# Patient Record
Sex: Female | Born: 1955
Health system: Southern US, Community
[De-identification: ages and names within clinical notes are randomized; demographics above are authoritative.]

## PROBLEM LIST (undated history)

## (undated) DIAGNOSIS — H5440 Blindness, one eye, unspecified eye: Secondary | ICD-10-CM

## (undated) DIAGNOSIS — K219 Gastro-esophageal reflux disease without esophagitis: Secondary | ICD-10-CM

## (undated) DIAGNOSIS — M1990 Unspecified osteoarthritis, unspecified site: Secondary | ICD-10-CM

## (undated) DIAGNOSIS — H5450 Low vision, one eye, unspecified eye: Secondary | ICD-10-CM

## (undated) DIAGNOSIS — E119 Type 2 diabetes mellitus without complications: Secondary | ICD-10-CM

## (undated) DIAGNOSIS — E785 Hyperlipidemia, unspecified: Secondary | ICD-10-CM

## (undated) DIAGNOSIS — H5410 Blindness, one eye, low vision other eye, unspecified eyes: Secondary | ICD-10-CM

## (undated) HISTORY — PX: CARPAL TUNNEL RELEASE: SHX101

## (undated) HISTORY — DX: Low vision, one eye, unspecified eye: H54.50

## (undated) HISTORY — DX: Unspecified osteoarthritis, unspecified site: M19.90

## (undated) HISTORY — PX: CATARACT PEDIATRIC: SHX6289

## (undated) HISTORY — DX: Type 2 diabetes mellitus without complications: E11.9

## (undated) HISTORY — DX: Gastro-esophageal reflux disease without esophagitis: K21.9

## (undated) HISTORY — DX: Blindness, one eye, low vision other eye, unspecified eyes: H54.10

## (undated) HISTORY — DX: Hyperlipidemia, unspecified: E78.5

## (undated) HISTORY — DX: Blindness, one eye, unspecified eye: H54.40

---

## 2000-09-23 ENCOUNTER — Other Ambulatory Visit: Admission: RE | Admit: 2000-09-23 | Discharge: 2000-09-23 | Payer: Self-pay | Admitting: Family Medicine

## 2011-05-06 ENCOUNTER — Encounter: Payer: Self-pay | Admitting: Nurse Practitioner

## 2011-05-06 DIAGNOSIS — M8580 Other specified disorders of bone density and structure, unspecified site: Secondary | ICD-10-CM | POA: Insufficient documentation

## 2011-05-06 DIAGNOSIS — M6780 Other specified disorders of synovium and tendon, unspecified site: Secondary | ICD-10-CM | POA: Insufficient documentation

## 2011-05-06 DIAGNOSIS — E559 Vitamin D deficiency, unspecified: Secondary | ICD-10-CM | POA: Insufficient documentation

## 2011-05-06 DIAGNOSIS — E785 Hyperlipidemia, unspecified: Secondary | ICD-10-CM | POA: Insufficient documentation

## 2011-05-06 DIAGNOSIS — K219 Gastro-esophageal reflux disease without esophagitis: Secondary | ICD-10-CM | POA: Insufficient documentation

## 2011-05-06 DIAGNOSIS — Z970 Presence of artificial eye: Secondary | ICD-10-CM | POA: Insufficient documentation

## 2011-05-06 DIAGNOSIS — H5440 Blindness, one eye, unspecified eye: Secondary | ICD-10-CM

## 2012-02-09 ENCOUNTER — Ambulatory Visit: Payer: BC Managed Care – PPO | Attending: Family Medicine | Admitting: Physical Therapy

## 2012-02-09 DIAGNOSIS — IMO0001 Reserved for inherently not codable concepts without codable children: Secondary | ICD-10-CM | POA: Insufficient documentation

## 2012-02-09 DIAGNOSIS — R5381 Other malaise: Secondary | ICD-10-CM | POA: Insufficient documentation

## 2012-02-09 DIAGNOSIS — M545 Low back pain, unspecified: Secondary | ICD-10-CM | POA: Insufficient documentation

## 2012-02-15 ENCOUNTER — Ambulatory Visit: Payer: BC Managed Care – PPO | Admitting: *Deleted

## 2012-02-18 ENCOUNTER — Ambulatory Visit: Payer: BC Managed Care – PPO | Admitting: Physical Therapy

## 2013-04-04 ENCOUNTER — Encounter: Payer: Self-pay | Admitting: Nurse Practitioner

## 2013-04-04 ENCOUNTER — Ambulatory Visit (INDEPENDENT_AMBULATORY_CARE_PROVIDER_SITE_OTHER): Payer: BC Managed Care – PPO | Admitting: Nurse Practitioner

## 2013-04-04 VITALS — BP 113/63 | HR 57 | Temp 97.5°F | Ht 62.0 in | Wt 170.0 lb

## 2013-04-04 DIAGNOSIS — R32 Unspecified urinary incontinence: Secondary | ICD-10-CM

## 2013-04-04 DIAGNOSIS — E119 Type 2 diabetes mellitus without complications: Secondary | ICD-10-CM

## 2013-04-04 DIAGNOSIS — E785 Hyperlipidemia, unspecified: Secondary | ICD-10-CM

## 2013-04-04 LAB — BASIC METABOLIC PANEL WITH GFR
BUN: 17 mg/dL (ref 6–23)
Chloride: 104 mEq/L (ref 96–112)
GFR, Est African American: >89 mL/min
Glucose, Bld: 97 mg/dL (ref 70–99)
Potassium: 4.6 mEq/L (ref 3.5–5.3)
Sodium: 139 mEq/L (ref 135–145)

## 2013-04-04 LAB — HEPATIC FUNCTION PANEL
ALT: 25 U/L (ref 0–35)
Alkaline Phosphatase: 38 U/L — ABNORMAL LOW (ref 39–117)
Bilirubin, Direct: 0.1 mg/dL (ref 0.0–0.3)
Indirect Bilirubin: 0.4 mg/dL (ref 0.0–0.9)
Total Protein: 7.4 g/dL (ref 6.0–8.3)

## 2013-04-04 NOTE — Progress Notes (Signed)
Subjective:    Patient ID: Katherine Ryan, female    DOB: 24-Jul-1956, 57 y.o.   MRN: [id number]  Hyperlipidemia This is a chronic problem. The current episode started more than 1 year ago. The problem is controlled. Recent lipid tests were reviewed and are normal. Exacerbating diseases include diabetes. There are no known factors aggravating her hyperlipidemia. Pertinent negatives include no focal weakness, leg pain or myalgias. Current antihyperlipidemic treatment includes statins. The current treatment provides significant improvement of lipids. There are no compliance problems.  Risk factors for coronary artery disease include diabetes mellitus.  Diabetes She presents for her follow-up diabetic visit. She has type 2 diabetes mellitus. No MedicAlert identification noted. The initial diagnosis of diabetes was made 4 months ago. Her disease course has been improving. There are no hypoglycemic associated symptoms. Pertinent negatives for diabetes include no blurred vision, no fatigue, no foot paresthesias, no polydipsia, no polyphagia, no polyuria, no weakness and no weight loss. There are no hypoglycemic complications. Symptoms are stable. Pertinent negatives for diabetic complications include no heart disease. Risk factors for coronary artery disease include dyslipidemia. Current diabetic treatment includes diet and oral agent (monotherapy). She is compliant with treatment all of the time. Her weight is stable. She is following a diabetic and generally healthy diet. When asked about meal planning, she reported none. She has not had a previous visit with a dietician. She participates in exercise intermittently. Home blood sugar record trend: Patient has never check blood sugars at home. An ACE inhibitor/angiotensin II receptor blocker is not being taken. She does not see a podiatrist.Eye exam current: January 2014.   * Patient c/o urinary incontinence- Has been going on for several months. Has to wear a pad  everyday.   Review of Systems  Constitutional: Negative for weight loss and fatigue.  HENT: Negative.   Eyes: Negative.  Negative for blurred vision.  Respiratory: Negative.   Cardiovascular: Negative.   Gastrointestinal: Negative.   Endocrine: Negative for polydipsia, polyphagia and polyuria.  Genitourinary: Negative.   Musculoskeletal: Negative.  Negative for myalgias.  Neurological: Negative.  Negative for focal weakness and weakness.  Psychiatric/Behavioral: Negative.        Objective:   Physical Exam  Constitutional: She is oriented to person, place, and time. She appears well-developed and well-nourished.  HENT:  Nose: Nose normal.  Mouth/Throat: Oropharynx is clear and moist.  Eyes: EOM are normal.  Neck: Trachea normal, normal range of motion and full passive range of motion without pain. Neck supple. No JVD present. Carotid bruit is not present. No thyromegaly present.  Cardiovascular: Normal rate, regular rhythm, normal heart sounds and intact distal pulses.  Exam reveals no gallop and no friction rub.   No murmur heard. Pulmonary/Chest: Effort normal and breath sounds normal.  Abdominal: Soft. Bowel sounds are normal. She exhibits no distension and no mass. There is no tenderness.  Musculoskeletal: Normal range of motion.  Lymphadenopathy:    She has no cervical adenopathy.  Neurological: She is alert and oriented to person, place, and time. She has normal reflexes.  (+) 4/4 monofilament bil  Skin: Skin is warm and dry.  No callus on feet  Psychiatric: She has a normal mood and affect. Her behavior is normal. Judgment and thought content normal.   BP 113/63  Pulse 57  Temp(Src) 97.5 F (36.4 C) (Oral)  Ht 5\' 2"  (1.575 m)  Wt 170 lb (77.111 kg)  BMI 31.09 kg/m2  LMP 04/04/2005 Results for orders placed in visit on 04/04/13  POCT GLYCOSYLATED HEMOGLOBIN (HGB A1C)      Result Value Range   Hemoglobin A1C 6.1            Assessment & Plan:  1.  Diabetes Continue to count carbs Exercise encouraged - HgB A1c - BASIC METABOLIC PANEL WITH GFR - Hepatic function panel - NMR Lipoprofile with Lipids  2. Other and unspecified hyperlipidemia Low fat diet  3. Urinary Incontinence Referral to urologist Kegel exercises encouraged  Mary-Margaret Daphine Deutscher, FNP

## 2013-04-04 NOTE — Patient Instructions (Addendum)
Diets for Diabetes, Food Labeling Look at food labels to help you decide how much of a product you can eat. You will want to check the amount of total carbohydrate in a serving to see how the food fits into your meal plan. In the list of ingredients, the ingredient present in the largest amount by weight must be listed first, followed by the other ingredients in descending order. STANDARD OF IDENTITY Most products have a list of ingredients. However, foods that the Food and Drug Administration (FDA) has given a standard of identity do not need a list of ingredients. A standard of identity means that a food must contain certain ingredients if it is called a particular name. Examples are mayonnaise, peanut butter, ketchup, jelly, and cheese. LABELING TERMS There are many terms found on food labels. Some of these terms have specific definitions. Some terms are regulated by the FDA, and the FDA has clearly specified how they can be used. Others are not regulated or well-defined and can be misleading and confusing. SPECIFICALLY DEFINED TERMS Nutritive Sweetener.  A sweetener that contains calories,such as table sugar or honey. Nonnutritive Sweetener.  A sweetener with few or no calories,such as saccharin, aspartame, sucralose, and cyclamate. LABELING TERMS REGULATED BY THE FDA Free.  The product contains only a tiny or small amount of fat, cholesterol, sodium, sugar, or calories. For example, a "fat-free" product will contain less than 0.5 g of fat per serving. Low.  A food described as "low" in fat, saturated fat, cholesterol, sodium, or calories could be eaten fairly often without exceeding dietary guidelines. For example, "low in fat" means no more than 3 g of fat per serving. Lean.  "Lean" and "extra lean" are U.S. Department of Agriculture (USDA) terms for use on meat and poultry products. "Lean" means the product contains less than 10 g of fat, 4 g of saturated fat, and 95 mg of cholesterol  per serving. "Lean" is not as low in fat as a product labeled "low." Extra Lean.  "Extra lean" means the product contains less than 5 g of fat, 2 g of saturated fat, and 95 mg of cholesterol per serving. While "extra lean" has less fat than "lean," it is still higher in fat than a product labeled "low." Reduced, Less, Fewer.  A diet product that contains 25% less of a nutrient or calories than the regular version. For example, hot dogs might be labeled "25% less fat than our regular hot dogs." Light/Lite.  A diet product that contains  fewer calories or  the fat of the original. For example, "light in sodium" means a product with  the usual sodium. More.  One serving contains at least 10% more of the daily value of a vitamin, mineral, or fiber than usual. Good Source Of.  One serving contains 10% to 19% of the daily value for a particular vitamin, mineral, or fiber. Excellent Source Of.  One serving contains 20% or more of the daily value for a particular nutrient. Other terms used might be "high in" or "rich in." Enriched or Fortified.  The product contains added vitamins, minerals, or protein. Nutrition labeling must be used on enriched or fortified foods. Imitation.  The product has been altered so that it is lower in protein, vitamins, or minerals than the usual food,such as imitation peanut butter. Total Fat.  The number listed is the total of all fat found in a serving of the product. Under total fat, food labels must list saturated fat and   trans fat, which are associated with raising bad cholesterol and an increased risk of heart blood vessel disease. Saturated Fat.  Mainly fats from animal-based sources. Some examples are red meat, cheese, cream, whole milk, and coconut oil. Trans Fat.  Found in some fried snack foods, packaged foods, and fried restaurant foods. It is recommended you eat as close to 0 g of trans fat as possible, since it raises bad cholesterol and lowers  good cholesterol. Polyunsaturated and Monounsaturated Fats.  More healthful fats. These fats are from plant sources. Total Carbohydrate.  The number of carbohydrate grams in a serving of the product. Under total carbohydrate are listed the other carbohydrate sources, such as dietary fiber and sugars. Dietary Fiber.  A carbohydrate from plant sources. Sugars.  Sugars listed on the label contain all naturally occurring sugars as well as added sugars. LABELING TERMS NOT REGULATED BY THE FDA Sugarless.  Table sugar (sucrose) has not been added. However, the manufacturer may use another form of sugar in place of sucrose to sweeten the product. For example, sugar alcohols are used to sweeten foods. Sugar alcohols are a form of sugar but are not table sugar. If a product contains sugar alcohols in place of sucrose, it can still be labeled "sugarless." Low Salt, Salt-Free, Unsalted, No Salt, No Salt Added, Without Added Salt.  Food that is usually processed with salt has been made without salt. However, the food may contain sodium-containing additives, such as preservatives, leavening agents, or flavorings. Natural.  This term has no legal meaning. Organic.  Foods that are certified as organic have been inspected and approved by the USDA to ensure they are produced without pesticides, fertilizers containing synthetic ingredients, bioengineering, or ionizing radiation. Document Released: 12/16/2003 Document Revised: 03/06/2012 Document Reviewed: 07/03/2009 [employer] Patient Information 2013 [employer].  

## 2013-04-05 LAB — NMR LIPOPROFILE WITH LIPIDS
Cholesterol, Total: 151 mg/dL (ref <200)
HDL Particle Number: 31.2 umol/L (ref >=30.5)
HDL-C: 51 mg/dL (ref >=40)
LDL (calc): 89 mg/dL (ref <100)
LDL Size: 20.2 nm — ABNORMAL LOW (ref >20.5)
LP-IR Score: <25 (ref <=45)
Large HDL-P: 8.7 umol/L (ref >=4.8)

## 2013-05-11 ENCOUNTER — Ambulatory Visit (INDEPENDENT_AMBULATORY_CARE_PROVIDER_SITE_OTHER): Payer: BC Managed Care – PPO | Admitting: Urology

## 2013-05-11 DIAGNOSIS — N3946 Mixed incontinence: Secondary | ICD-10-CM

## 2013-07-02 ENCOUNTER — Telehealth: Payer: Self-pay | Admitting: Family Medicine

## 2013-07-06 ENCOUNTER — Ambulatory Visit: Payer: BC Managed Care – PPO | Admitting: Nurse Practitioner

## 2013-07-11 ENCOUNTER — Encounter: Payer: Self-pay | Admitting: *Deleted

## 2013-07-20 ENCOUNTER — Ambulatory Visit (INDEPENDENT_AMBULATORY_CARE_PROVIDER_SITE_OTHER): Payer: BC Managed Care – PPO | Admitting: Urology

## 2013-07-20 DIAGNOSIS — N3946 Mixed incontinence: Secondary | ICD-10-CM

## 2013-07-27 ENCOUNTER — Other Ambulatory Visit: Payer: Self-pay | Admitting: *Deleted

## 2013-07-27 MED ORDER — METFORMIN HCL 500 MG PO TABS: 500.0000 mg | ORAL_TABLET | Freq: Every day | ORAL | Status: DC

## 2013-08-08 ENCOUNTER — Ambulatory Visit: Payer: BC Managed Care – PPO | Admitting: Nurse Practitioner

## 2013-08-09 ENCOUNTER — Ambulatory Visit (INDEPENDENT_AMBULATORY_CARE_PROVIDER_SITE_OTHER): Payer: BC Managed Care – PPO | Admitting: Nurse Practitioner

## 2013-08-09 ENCOUNTER — Encounter: Payer: Self-pay | Admitting: Nurse Practitioner

## 2013-08-09 VITALS — BP 127/73 | HR 65 | Temp 98.3°F | Ht 62.0 in | Wt 166.0 lb

## 2013-08-09 DIAGNOSIS — M949 Disorder of cartilage, unspecified: Secondary | ICD-10-CM

## 2013-08-09 DIAGNOSIS — M899 Disorder of bone, unspecified: Secondary | ICD-10-CM

## 2013-08-09 DIAGNOSIS — K219 Gastro-esophageal reflux disease without esophagitis: Secondary | ICD-10-CM

## 2013-08-09 DIAGNOSIS — M8580 Other specified disorders of bone density and structure, unspecified site: Secondary | ICD-10-CM

## 2013-08-09 DIAGNOSIS — E119 Type 2 diabetes mellitus without complications: Secondary | ICD-10-CM | POA: Insufficient documentation

## 2013-08-09 DIAGNOSIS — E785 Hyperlipidemia, unspecified: Secondary | ICD-10-CM

## 2013-08-09 LAB — POCT GLYCOSYLATED HEMOGLOBIN (HGB A1C): Hemoglobin A1C: 6.4

## 2013-08-09 MED ORDER — ROSUVASTATIN CALCIUM 10 MG PO TABS: 10.0000 mg | ORAL_TABLET | Freq: Every day | ORAL | Status: DC

## 2013-08-09 MED ORDER — ESOMEPRAZOLE MAGNESIUM 40 MG PO CPDR: 40.0000 mg | DELAYED_RELEASE_CAPSULE | Freq: Every day | ORAL | Status: DC | PRN

## 2013-08-09 MED ORDER — METFORMIN HCL 500 MG PO TABS: 500.0000 mg | ORAL_TABLET | Freq: Every day | ORAL | Status: DC

## 2013-08-09 NOTE — Progress Notes (Signed)
Subjective:    Patient ID: Katherine Ryan, female    DOB: 03/11/1956, 57 y.o.   MRN: [id number]  Hyperlipidemia This is a chronic problem. The current episode started more than 1 year ago. The problem is controlled. Recent lipid tests were reviewed and are normal. Exacerbating diseases include diabetes. There are no known factors aggravating her hyperlipidemia. Pertinent negatives include no focal weakness, leg pain or myalgias. Current antihyperlipidemic treatment includes statins. The current treatment provides significant improvement of lipids. There are no compliance problems.  Risk factors for coronary artery disease include diabetes mellitus.  Diabetes She presents for her follow-up diabetic visit. She has type 2 diabetes mellitus. No MedicAlert identification noted. The initial diagnosis of diabetes was made 4 months ago. Her disease course has been improving. There are no hypoglycemic associated symptoms. Pertinent negatives for diabetes include no blurred vision, no fatigue, no foot paresthesias, no polydipsia, no polyphagia, no polyuria, no weakness and no weight loss. There are no hypoglycemic complications. Symptoms are stable. Pertinent negatives for diabetic complications include no heart disease. Risk factors for coronary artery disease include dyslipidemia. Current diabetic treatment includes diet and oral agent (monotherapy). She is compliant with treatment all of the time. Her weight is stable. She is following a diabetic and generally healthy diet. When asked about meal planning, she reported none. She has not had a previous visit with a dietician. She participates in exercise intermittently. Home blood sugar record trend: Patient has never check blood sugars at home. An ACE inhibitor/angiotensin II receptor blocker is not being taken. She does not see a podiatrist.Eye exam current: January 2014.  GERD Nexium works well- no symptoms when takes meds  Review of Systems  Constitutional:  Negative for weight loss and fatigue.  HENT: Negative.   Eyes: Negative.  Negative for blurred vision.  Respiratory: Negative.   Cardiovascular: Negative.   Gastrointestinal: Negative.   Endocrine: Negative for polydipsia, polyphagia and polyuria.  Genitourinary: Negative.   Musculoskeletal: Negative.  Negative for myalgias.  Neurological: Negative.  Negative for focal weakness and weakness.  Psychiatric/Behavioral: Negative.        Objective:   Physical Exam  Constitutional: She is oriented to person, place, and time. She appears well-developed and well-nourished.  HENT:  Nose: Nose normal.  Mouth/Throat: Oropharynx is clear and moist.  Eyes: EOM are normal.  Neck: Trachea normal, normal range of motion and full passive range of motion without pain. Neck supple. No JVD present. Carotid bruit is not present. No thyromegaly present.  Cardiovascular: Normal rate, regular rhythm, normal heart sounds and intact distal pulses.  Exam reveals no gallop and no friction rub.   No murmur heard. Pulmonary/Chest: Effort normal and breath sounds normal.  Abdominal: Soft. Bowel sounds are normal. She exhibits no distension and no mass. There is no tenderness.  Musculoskeletal: Normal range of motion.  Lymphadenopathy:    She has no cervical adenopathy.  Neurological: She is alert and oriented to person, place, and time. She has normal reflexes.  (+) 4/4 monofilament bil  Skin: Skin is warm and dry.  No callus on feet  Psychiatric: She has a normal mood and affect. Her behavior is normal. Judgment and thought content normal.   BP 127/73  Pulse 65  Temp(Src) 98.3 F (36.8 C) (Oral)  Ht 5\' 2"  (1.575 m)  Wt 166 lb (75.297 kg)  BMI 30.35 kg/m2  LMP 04/04/2005 Results for orders placed in visit on 08/09/13  POCT GLYCOSYLATED HEMOGLOBIN (HGB A1C)  Result Value Range   Hemoglobin A1C 6.4%          Assessment & Plan:  1. Hyperlipemia Low fat diet an dexercise - CMP14+EGFR - NMR,  lipoprofile  2. Osteopenia Weight bearing exercises  3. GERD (gastroesophageal reflux disease) Watch spicy foods in diet  4. Diabetes Carb counting reviewed  Meds ordered this encounter  Medications  . mirabegron ER (MYRBETRIQ) 50 MG TB24 tablet    Sig: Take 50 mg by mouth daily.  . rosuvastatin (CRESTOR) 10 MG tablet    Sig: Take 1 tablet (10 mg total) by mouth daily.    Dispense:  30 tablet    Refill:  5    Order Specific Question:  Supervising Provider    Answer:  Ernestina Penna [[id number]]  . esomeprazole (NEXIUM) 40 MG capsule    Sig: Take 1 capsule (40 mg total) by mouth daily as needed.    Dispense:  30 capsule    Refill:  5    Order Specific Question:  Supervising Provider    Answer:  Ernestina Penna [[id number]]  . metFORMIN (GLUCOPHAGE) 500 MG tablet    Sig: Take 1 tablet (500 mg total) by mouth daily with breakfast.    Dispense:  30 tablet    Refill:  0    Needs to be seen before next refill    Order Specific Question:  Supervising Provider    Answer:  Ernestina Penna [[id number]]     Katherine Daphine Deutscher, FNP

## 2013-08-09 NOTE — Patient Instructions (Addendum)

## 2013-08-11 LAB — NMR, LIPOPROFILE
HDL Cholesterol by NMR: 54 mg/dL (ref >=40)
LDLC SERPL CALC-MCNC: 71 mg/dL (ref <100)
Small LDL Particle Number: 150 nmol/L (ref <=527)
Triglycerides by NMR: 197 mg/dL — ABNORMAL HIGH (ref <150)

## 2013-08-11 LAB — CMP14+EGFR
ALT: 30 IU/L (ref 0–32)
AST: 29 IU/L (ref 0–40)
Albumin/Globulin Ratio: 1.8 (ref 1.1–2.5)
CO2: 29 mmol/L (ref 18–29)
Calcium: 9.7 mg/dL (ref 8.7–10.2)
Creatinine, Ser: 0.58 mg/dL (ref 0.57–1.00)
GFR calc non Af Amer: 103 mL/min/{1.73_m2} (ref >59)
Globulin, Total: 2.6 g/dL (ref 1.5–4.5)
Glucose: 99 mg/dL (ref 65–99)
Potassium: 4.7 mmol/L (ref 3.5–5.2)
Total Protein: 7.3 g/dL (ref 6.0–8.5)

## 2013-08-14 ENCOUNTER — Telehealth: Payer: Self-pay | Admitting: Nurse Practitioner

## 2013-08-14 NOTE — Telephone Encounter (Signed)
Left details on pt vm. 

## 2013-09-14 ENCOUNTER — Ambulatory Visit (INDEPENDENT_AMBULATORY_CARE_PROVIDER_SITE_OTHER): Payer: BC Managed Care – PPO | Admitting: Urology

## 2013-09-14 DIAGNOSIS — N3946 Mixed incontinence: Secondary | ICD-10-CM

## 2013-10-01 ENCOUNTER — Other Ambulatory Visit: Payer: Self-pay

## 2013-10-01 DIAGNOSIS — E119 Type 2 diabetes mellitus without complications: Secondary | ICD-10-CM

## 2013-10-01 MED ORDER — METFORMIN HCL 500 MG PO TABS: 500.0000 mg | ORAL_TABLET | Freq: Every day | ORAL | Status: DC

## 2013-11-01 ENCOUNTER — Other Ambulatory Visit: Payer: Self-pay

## 2013-11-14 ENCOUNTER — Ambulatory Visit (INDEPENDENT_AMBULATORY_CARE_PROVIDER_SITE_OTHER): Payer: BC Managed Care – PPO | Admitting: Nurse Practitioner

## 2013-11-14 ENCOUNTER — Encounter: Payer: Self-pay | Admitting: Nurse Practitioner

## 2013-11-14 VITALS — BP 133/74 | HR 56 | Temp 98.3°F | Ht 62.0 in | Wt 168.0 lb

## 2013-11-14 DIAGNOSIS — K219 Gastro-esophageal reflux disease without esophagitis: Secondary | ICD-10-CM

## 2013-11-14 DIAGNOSIS — E785 Hyperlipidemia, unspecified: Secondary | ICD-10-CM

## 2013-11-14 DIAGNOSIS — M899 Disorder of bone, unspecified: Secondary | ICD-10-CM

## 2013-11-14 DIAGNOSIS — M8580 Other specified disorders of bone density and structure, unspecified site: Secondary | ICD-10-CM

## 2013-11-14 DIAGNOSIS — E119 Type 2 diabetes mellitus without complications: Secondary | ICD-10-CM

## 2013-11-14 LAB — POCT GLYCOSYLATED HEMOGLOBIN (HGB A1C): Hemoglobin A1C: 5.5

## 2013-11-14 NOTE — Patient Instructions (Signed)

## 2013-11-14 NOTE — Progress Notes (Signed)
Subjective:    Patient ID: Katherine Ryan, female    DOB: [birth year], 57 y.o.   MRN: [id number]  Hyperlipidemia This is a chronic problem. The current episode started more than 1 year ago. The problem is controlled. Recent lipid tests were reviewed and are normal. Exacerbating diseases include diabetes. There are no known factors aggravating her hyperlipidemia. Pertinent negatives include no focal weakness, leg pain or myalgias. Current antihyperlipidemic treatment includes statins. The current treatment provides significant improvement of lipids. There are no compliance problems.  Risk factors for coronary artery disease include diabetes mellitus.  Diabetes She presents for her follow-up diabetic visit. She has type 2 diabetes mellitus. No MedicAlert identification noted. The initial diagnosis of diabetes was made 4 months ago. Her disease course has been improving. There are no hypoglycemic associated symptoms. Pertinent negatives for diabetes include no blurred vision, no fatigue, no foot paresthesias, no polydipsia, no polyphagia, no polyuria, no weakness and no weight loss. There are no hypoglycemic complications. Symptoms are stable. Pertinent negatives for diabetic complications include no heart disease. Risk factors for coronary artery disease include dyslipidemia. Current diabetic treatment includes diet and oral agent (monotherapy). She is compliant with treatment all of the time. Her weight is stable. She is following a diabetic and generally healthy diet. When asked about meal planning, she reported none. She has not had a previous visit with a dietician. She participates in exercise intermittently. Home blood sugar record trend: Patient has never check blood sugars at home. An ACE inhibitor/angiotensin II receptor blocker is not being taken. She does not see a podiatrist.Eye exam current: January 2014.  GERD Nexium works well- no symptoms when takes meds  Review of Systems  Constitutional:  Negative for weight loss and fatigue.  HENT: Negative.   Eyes: Negative.  Negative for blurred vision.  Respiratory: Negative.   Cardiovascular: Negative.   Gastrointestinal: Negative.   Endocrine: Negative for polydipsia, polyphagia and polyuria.  Genitourinary: Negative.   Musculoskeletal: Negative.  Negative for myalgias.  Neurological: Negative.  Negative for focal weakness and weakness.  Psychiatric/Behavioral: Negative.        Objective:   Physical Exam  Constitutional: She is oriented to person, place, and time. She appears well-developed and well-nourished.  HENT:  Nose: Nose normal.  Mouth/Throat: Oropharynx is clear and moist.  Eyes: EOM are normal.  Neck: Trachea normal, normal range of motion and full passive range of motion without pain. Neck supple. No JVD present. Carotid bruit is not present. No thyromegaly present.  Cardiovascular: Normal rate, regular rhythm, normal heart sounds and intact distal pulses.  Exam reveals no gallop and no friction rub.   No murmur heard. Pulmonary/Chest: Effort normal and breath sounds normal.  Abdominal: Soft. Bowel sounds are normal. She exhibits no distension and no mass. There is no tenderness.  Musculoskeletal: Normal range of motion.  Lymphadenopathy:    She has no cervical adenopathy.  Neurological: She is alert and oriented to person, place, and time. She has normal reflexes.  (+) 4/4 monofilament bil  Skin: Skin is warm and dry.  No callus on feet  Psychiatric: She has a normal mood and affect. Her behavior is normal. Judgment and thought content normal.   BP 133/74  Pulse 56  Temp(Src) 98.3 F (36.8 C) (Oral)  Ht 5\' 2"  (1.575 m)  Wt 168 lb (76.204 kg)  BMI 30.72 kg/m2  LMP 04/04/2005 Results for orders placed in visit on 11/14/13  POCT GLYCOSYLATED HEMOGLOBIN (HGB A1C)  Result Value Range   Hemoglobin A1C 5.5%          Assessment & Plan:   1. Hyperlipemia   2. Type II or unspecified type diabetes  mellitus without mention of complication, not stated as uncontrolled   3. GERD (gastroesophageal reflux disease)   4. Osteopenia    Orders Placed This Encounter  Procedures  . DG Bone Density    Standing Status: Future     Number of Occurrences:      Standing Expiration Date: 01/14/2015    Order Specific Question:  Reason for Exam (SYMPTOM  OR DIAGNOSIS REQUIRED)    Answer:  osteopenia    Order Specific Question:  Is the patient pregnant?    Answer:  No    Order Specific Question:  Preferred imaging location?    Answer:  Internal  . CMP14+EGFR  . NMR, lipoprofile  . POCT glycosylated hemoglobin (Hb A1C)   Meds ordered this encounter  Medications  . DISCONTD: TOVIAZ 8 MG TB24 tablet    Sig:     Continue all meds Labs pending Diet and exercise encouraged Health maintenance reviewed Follow up in 3 months hemocult cards given  Katherine Daphine Deutscher, FNP

## 2013-11-15 ENCOUNTER — Other Ambulatory Visit (INDEPENDENT_AMBULATORY_CARE_PROVIDER_SITE_OTHER): Payer: BC Managed Care – PPO

## 2013-11-15 DIAGNOSIS — Z1212 Encounter for screening for malignant neoplasm of rectum: Secondary | ICD-10-CM

## 2013-11-15 LAB — CMP14+EGFR
ALT: 28 IU/L (ref 0–32)
AST: 21 IU/L (ref 0–40)
Alkaline Phosphatase: 64 IU/L (ref 39–117)
CO2: 27 mmol/L (ref 18–29)
Calcium: 9.9 mg/dL (ref 8.7–10.2)
Creatinine, Ser: 0.71 mg/dL (ref 0.57–1.00)
Glucose: 103 mg/dL — ABNORMAL HIGH (ref 65–99)
Potassium: 4.7 mmol/L (ref 3.5–5.2)
Sodium: 141 mmol/L (ref 134–144)

## 2013-11-15 LAB — NMR, LIPOPROFILE
Cholesterol: 137 mg/dL (ref <200)
HDL Cholesterol by NMR: 66 mg/dL (ref >=40)
LDLC SERPL CALC-MCNC: 60 mg/dL (ref <100)
LP-IR Score: <25 (ref <=45)
Small LDL Particle Number: 480 nmol/L (ref <=527)

## 2013-11-16 LAB — FECAL OCCULT BLOOD, IMMUNOCHEMICAL: Fecal Occult Bld: NEGATIVE

## 2013-11-28 ENCOUNTER — Ambulatory Visit (INDEPENDENT_AMBULATORY_CARE_PROVIDER_SITE_OTHER): Payer: BC Managed Care – PPO | Admitting: Pharmacist

## 2013-11-28 ENCOUNTER — Ambulatory Visit (INDEPENDENT_AMBULATORY_CARE_PROVIDER_SITE_OTHER): Payer: BC Managed Care – PPO

## 2013-11-28 VITALS — Ht 62.0 in | Wt 172.0 lb

## 2013-11-28 DIAGNOSIS — M8580 Other specified disorders of bone density and structure, unspecified site: Secondary | ICD-10-CM

## 2013-11-28 DIAGNOSIS — M899 Disorder of bone, unspecified: Secondary | ICD-10-CM

## 2013-11-28 NOTE — Patient Instructions (Addendum)
Blood Gluose Goals  Fasting (nothing to eat within last 4 hours) = 80 to 130 Within 2 hours of the start of a meal less than 180    Hypoglycemia (Low Blood Sugar) Hypoglycemia is when the glucose (sugar) in your blood is too low. Hypoglycemia can happen for many reasons. It can happen to people with or without diabetes. Hypoglycemia can develop quickly and can be a medical emergency.  CAUSES  Having hypoglycemia does not mean that you will develop diabetes. Different causes include:  Missed or delayed meals or not enough carbohydrates eaten.  Medication overdose. This could be by accident or deliberate. If by accident, your medication may need to be adjusted or changed.  Exercise or increased activity without adjustments in carbohydrates or medications.  A nerve disorder that affects body functions like your heart rate, blood pressure and digestion (autonomic neuropathy).  A condition where the stomach muscles do not function properly (gastroparesis). Therefore, medications may not absorb properly.  The inability to recognize the signs of hypoglycemia (hypoglycemic unawareness).  Absorption of insulin  may be altered.  Alcohol consumption.  Pregnancy/menstrual cycles/postpartum. This may be due to hormones.  Certain kinds of tumors. This is very rare. SYMPTOMS   Sweating.  Hunger.  Dizziness.  Blurred vision.  Drowsiness.  Weakness.  Headache.  Rapid heart beat.  Shakiness.  Nervousness. DIAGNOSIS  Diagnosis is made by monitoring blood glucose in one or all of the following ways:  Fingerstick blood glucose monitoring.  Laboratory results. TREATMENT  If you think your blood glucose is low:  Check your blood glucose, if possible. If it is less than 70 mg/dl, take one of the following:  3-4 glucose tablets.   cup juice (prefer clear like apple).   cup "regular" soda pop.  1 cup milk.  -1 tube of glucose gel.  5-6 hard candies.  Do not over  treat because your blood glucose (sugar) will only go too high.  Wait 15 minutes and recheck your blood glucose. If it is still less than 70 mg/dl (or below your target range), repeat treatment.  Eat a snack if it is more than one hour until your next meal. Sometimes, your blood glucose may go so low that you are unable to treat yourself. You may need someone to help you. You may even pass out or be unable to swallow. This may require you to get an injection of glucagon, which raises the blood glucose. HOME CARE INSTRUCTIONS  Check blood glucose as recommended by your caregiver.  Take medication as prescribed by your caregiver.  Follow your meal plan. Do not skip meals. Eat on time.  If you are going to drink alcohol, drink it only with meals.  Check your blood glucose before driving.  Check your blood glucose before and after exercise. If you exercise longer or different than usual, be sure to check blood glucose more frequently.  Always carry treatment with you. Glucose tablets are the easiest to carry.  Always wear medical alert jewelry or carry some form of identification that states that you have diabetes. This will alert people that you have diabetes. If you have hypoglycemia, they will have a better idea on what to do. SEEK MEDICAL CARE IF:   You are having problems keeping your blood sugar at target range.  You are having frequent episodes of hypoglycemia.  You feel you might be having side effects from your medicines.  You have symptoms of an illness that is not improving after 3-4  days.  You notice a change in vision or a new problem with your vision. SEEK IMMEDIATE MEDICAL CARE IF:   You are a family member or friend of a person whose blood glucose goes below 70 mg/dl and is accompanied by:  Confusion.  A change in mental status.  The inability to swallow.  Passing out. Document Released: 12/13/2005 Document Revised: 03/06/2012 Document Reviewed:  04/10/2012 [facility] Patient Information 2014 [place], [place].  Fall Prevention and Home Safety Falls cause injuries and can affect all age groups. It is possible to use preventive measures to significantly decrease the likelihood of falls. There are many simple measures which can make your home safer and prevent falls. OUTDOORS  Repair cracks and edges of walkways and driveways.  Remove high doorway thresholds.  Trim shrubbery on the main path into your home.  Have good outside lighting.  Clear walkways of tools, rocks, debris, and clutter.  Check that handrails are not broken and are securely fastened. Both sides of steps should have handrails.  Have leaves, snow, and ice cleared regularly.  Use sand or salt on walkways during winter months.  In the garage, clean up grease or oil spills. BATHROOM  Install night lights.  Install grab bars by the toilet and in the tub and shower.  Use non-skid mats or decals in the tub or shower.  Place a plastic non-slip stool in the shower to sit on, if needed.  Keep floors dry and clean up all water on the floor immediately.  Remove soap buildup in the tub or shower on a regular basis.  Secure bath mats with non-slip, double-sided rug tape.  Remove throw rugs and tripping hazards from the floors. BEDROOMS  Install night lights.  Make sure a bedside light is easy to reach.  Do not use oversized bedding.  Keep a telephone by your bedside.  Have a firm chair with side arms to use for getting dressed.  Remove throw rugs and tripping hazards from the floor. KITCHEN  Keep handles on pots and pans turned toward the center of the stove. Use back burners when possible.  Clean up spills quickly and allow time for drying.  Avoid walking on wet floors.  Avoid hot utensils and knives.  Position shelves so they are not too high or low.  Place commonly used objects within easy reach.  If necessary, use a sturdy step stool with a  grab bar when reaching.  Keep electrical cables out of the way.  Do not use floor polish or wax that makes floors slippery. If you must use wax, use non-skid floor wax.  Remove throw rugs and tripping hazards from the floor. STAIRWAYS  Never leave objects on stairs.  Place handrails on both sides of stairways and use them. Fix any loose handrails. Make sure handrails on both sides of the stairways are as long as the stairs.  Check carpeting to make sure it is firmly attached along stairs. Make repairs to worn or loose carpet promptly.  Avoid placing throw rugs at the top or bottom of stairways, or properly secure the rug with carpet tape to prevent slippage. Get rid of throw rugs, if possible.  Have an electrician put in a light switch at the top and bottom of the stairs. OTHER FALL PREVENTION TIPS  Wear low-heel or rubber-soled shoes that are supportive and fit well. Wear closed toe shoes.  When using a stepladder, make sure it is fully opened and both spreaders are firmly locked. Do not  climb a closed stepladder.  Add color or contrast paint or tape to grab bars and handrails in your home. Place contrasting color strips on first and last steps.  Learn and use mobility aids as needed. Install an electrical emergency response system.  Turn on lights to avoid dark areas. Replace light bulbs that burn out immediately. Get light switches that glow.  Arrange furniture to create clear pathways. Keep furniture in the same place.  Firmly attach carpet with non-skid or double-sided tape.  Eliminate uneven floor surfaces.  Select a carpet pattern that does not visually hide the edge of steps.  Be aware of all pets. OTHER HOME SAFETY TIPS  Set the water temperature for 120 F (48.8 C).  Keep emergency numbers on or near the telephone.  Keep smoke detectors on every level of the home and near sleeping areas. Document Released: 12/03/2002 Document Revised: 06/13/2012 Document  Reviewed: 03/03/2012 [facility] Patient Information 2014 [place], [place].

## 2013-11-28 NOTE — Progress Notes (Signed)
Patient ID: Katherine Ryan, female   DOB: 01-20-1956, 57 y.o.   MRN: [id number]  [facility] Current Height: Height: 5\' 2"  (157.5 cm)      Max Lifetime Height:  5\' 2"  Current Weight: Weight: 172 lb (78.019 kg)       Ethnicity:Hispanic    HPI: Does pt already have a diagnosis of:  Osteopenia?  Yes Osteoporosis?  No  Back Pain?  No       Kyphosis?  No Prior fracture?  No Med(s) for Osteoporosis/Osteopenia:  none Med(s) previously tried for Osteoporosis/Osteopenia:  none                                                             PMH: Age at menopause:  18's Hysterectomy?  No Oophorectomy?  No HRT? No Steroid Use?  No Thyroid med?  No History of cancer?  No History of digestive disorders (ie Crohn's)?  Yes - GERD on PPI but only takes occassionally Current or previous eating disorders?  No Last Vitamin D Result:  36 (11/2012) Last GFR Result:  95 (11 19 2014)   FH/SH: Family history of osteoporosis?  No Parent with history of hip fracture?  No Family history of breast cancer?  No Exercise?  No Smoking?  No - quit 2012 Alcohol?  No    Calcium Assessment Calcium Intake  # of servings/day  Calcium mg  Milk (8 oz) - almond 1  x  400  = 400mg   Yogurt (4 oz) 1 x  200 = 200mg   Cheese (1 oz) 0 x  200 = 0  Other Calcium sources   250mg   Ca supplement 0 = 0   Estimated calcium intake per day 850mg     DEXA Results Date of Test T-Score for AP Spine L1-L4 T-Score for Total Left Hip T-Score for Total Right Hip  11/27/2013 -1.0 0.6 0.4  12/30/2010 -1.3 0.2 0.3             Assessment: Osteopenia with improved BMD Type 2 Dm - well controlled but patient interested in glucometer because concern that she is having low BG when exercising  Recommendations: 1.  Discussed DEXA results and fracture risk 2.  Increase calcium 1200mg  daily through supplementation or diet.  3.  recommend weight bearing exercise - 30 minutes at least 4 days  per week.   4.  Counseled and  educated about fall risk and prevention. 5.  Patient given glucometer and taught to use.  Discussed s/s of hypoglycemia and how to treat.  She is instructed to call office for medication adjustment if has more than 1 hypoglycemic event in 7 days.   Henrene Pastor, PharmD, CPP   Recheck DEXA:  2 years  Time spent counseling patient:  25 minutes

## 2013-12-06 ENCOUNTER — Other Ambulatory Visit: Payer: Self-pay | Admitting: *Deleted

## 2013-12-06 DIAGNOSIS — E119 Type 2 diabetes mellitus without complications: Secondary | ICD-10-CM

## 2013-12-06 MED ORDER — METFORMIN HCL 500 MG PO TABS: 500.0000 mg | ORAL_TABLET | Freq: Every day | ORAL | Status: DC

## 2013-12-07 ENCOUNTER — Ambulatory Visit (INDEPENDENT_AMBULATORY_CARE_PROVIDER_SITE_OTHER): Payer: BC Managed Care – PPO | Admitting: Urology

## 2013-12-07 DIAGNOSIS — N3946 Mixed incontinence: Secondary | ICD-10-CM

## 2014-02-15 ENCOUNTER — Encounter: Payer: Self-pay | Admitting: Nurse Practitioner

## 2014-02-15 ENCOUNTER — Ambulatory Visit (INDEPENDENT_AMBULATORY_CARE_PROVIDER_SITE_OTHER): Payer: BC Managed Care – PPO | Admitting: Nurse Practitioner

## 2014-02-15 VITALS — BP 141/74 | HR 52 | Temp 97.0°F | Ht 62.0 in | Wt 170.0 lb

## 2014-02-15 DIAGNOSIS — M25569 Pain in unspecified knee: Secondary | ICD-10-CM

## 2014-02-15 DIAGNOSIS — K219 Gastro-esophageal reflux disease without esophagitis: Secondary | ICD-10-CM

## 2014-02-15 DIAGNOSIS — M25561 Pain in right knee: Secondary | ICD-10-CM

## 2014-02-15 DIAGNOSIS — E1169 Type 2 diabetes mellitus with other specified complication: Secondary | ICD-10-CM | POA: Insufficient documentation

## 2014-02-15 DIAGNOSIS — M25562 Pain in left knee: Secondary | ICD-10-CM

## 2014-02-15 DIAGNOSIS — E785 Hyperlipidemia, unspecified: Secondary | ICD-10-CM

## 2014-02-15 DIAGNOSIS — E119 Type 2 diabetes mellitus without complications: Secondary | ICD-10-CM

## 2014-02-15 LAB — POCT GLYCOSYLATED HEMOGLOBIN (HGB A1C): Hemoglobin A1C: 5.8

## 2014-02-15 MED ORDER — ROSUVASTATIN CALCIUM 10 MG PO TABS: 10.0000 mg | ORAL_TABLET | Freq: Every day | ORAL | Status: DC

## 2014-02-15 MED ORDER — METFORMIN HCL 500 MG PO TABS: 500.0000 mg | ORAL_TABLET | Freq: Every day | ORAL | Status: DC

## 2014-02-15 NOTE — Progress Notes (Signed)
Subjective:    Patient ID: Katherine Ryan, female    DOB: [birth year], 58 y.o.   MRN: [id number]  Patient in today for follow up of chronic medical problems.  Hyperlipidemia This is a chronic problem. The current episode started more than 1 year ago. The problem is controlled. Recent lipid tests were reviewed and are normal. Exacerbating diseases include diabetes. There are no known factors aggravating her hyperlipidemia. Pertinent negatives include no focal weakness, leg pain or myalgias. Current antihyperlipidemic treatment includes statins. The current treatment provides significant improvement of lipids. There are no compliance problems.  Risk factors for coronary artery disease include diabetes mellitus.  Diabetes She presents for her follow-up diabetic visit. She has type 2 diabetes mellitus. No MedicAlert identification noted. The initial diagnosis of diabetes was made 4 months ago. Her disease course has been improving. There are no hypoglycemic associated symptoms. Pertinent negatives for diabetes include no blurred vision, no fatigue, no foot paresthesias, no polydipsia, no polyphagia, no polyuria, no weakness and no weight loss. There are no hypoglycemic complications. Symptoms are stable. Pertinent negatives for diabetic complications include no heart disease. Risk factors for coronary artery disease include dyslipidemia. Current diabetic treatment includes diet and oral agent (monotherapy). She is compliant with treatment all of the time. Her weight is stable. She is following a diabetic and generally healthy diet. When asked about meal planning, she reported none. She has not had a previous visit with a dietician. She participates in exercise intermittently. Home blood sugar record trend: Patient has never check blood sugars at home. An ACE inhibitor/angiotensin II receptor blocker is not being taken. She does not see a podiatrist.Eye exam current: January 2014.  GERD Nexium works well- no  symptoms when takes meds  * patient c/o bil knee soreness- only occurs at work when she has to go up and down a ladder.   Review of Systems  Constitutional: Negative for weight loss and fatigue.  HENT: Negative.   Eyes: Negative.  Negative for blurred vision.  Respiratory: Negative.   Cardiovascular: Negative.   Gastrointestinal: Negative.   Endocrine: Negative for polydipsia, polyphagia and polyuria.  Genitourinary: Negative.   Musculoskeletal: Negative.  Negative for myalgias.  Neurological: Negative.  Negative for focal weakness and weakness.  Psychiatric/Behavioral: Negative.        Objective:   Physical Exam  Constitutional: She is oriented to person, place, and time. She appears well-developed and well-nourished.  HENT:  Nose: Nose normal.  Mouth/Throat: Oropharynx is clear and moist.  Eyes: EOM are normal.  Neck: Trachea normal, normal range of motion and full passive range of motion without pain. Neck supple. No JVD present. Carotid bruit is not present. No thyromegaly present.  Cardiovascular: Normal rate, regular rhythm, normal heart sounds and intact distal pulses.  Exam reveals no gallop and no friction rub.   No murmur heard. Pulmonary/Chest: Effort normal and breath sounds normal.  Abdominal: Soft. Bowel sounds are normal. She exhibits no distension and no mass. There is no tenderness.  Musculoskeletal: Normal range of motion.  FROM of bil knees without pain- no effusions - no crepitus All ligaments intact  Lymphadenopathy:    She has no cervical adenopathy.  Neurological: She is alert and oriented to person, place, and time. She has normal reflexes.  (+) 4/4 monofilament bil  Skin: Skin is warm and dry.  No callus on feet  Psychiatric: She has a normal mood and affect. Her behavior is normal. Judgment and thought content normal.   BP 141/74  Pulse 52  Temp(Src) 97 F (36.1 C) (Oral)  Ht '5\' 2"'  (1.575 m)  Wt 170 lb (77.111 kg)  BMI 31.09 kg/m2  LMP  04/04/2005 Results for orders placed in visit on 02/15/14  POCT GLYCOSYLATED HEMOGLOBIN (HGB A1C)      Result Value Ref Range   Hemoglobin A1C 5.8         Assessment & Plan:    1. Hyperlipemia   2. Type II or unspecified type diabetes mellitus without mention of complication, not stated as uncontrolled   3. GERD (gastroesophageal reflux disease)   4. Hyperlipidemia LDL goal < 100   5. Diabetes   6. Bilateral knee pain    Orders Placed This Encounter  Procedures  . CMP14+EGFR  . NMR, lipoprofile  . POCT glycosylated hemoglobin (Hb A1C)   Meds ordered this encounter  Medications  . rosuvastatin (CRESTOR) 10 MG tablet    Sig: Take 1 tablet (10 mg total) by mouth daily.    Dispense:  30 tablet    Refill:  5    Order Specific Question:  Supervising Provider    Answer:  Chipper Herb [[id number]]  . metFORMIN (GLUCOPHAGE) 500 MG tablet    Sig: Take 1 tablet (500 mg total) by mouth daily with breakfast.    Dispense:  30 tablet    Refill:  2    Order Specific Question:  Supervising Provider    Answer:  Chipper Herb [[id number]]   Tylenol or motrin OTC for knee pain Labs pending Health maintenance reviewed Diet and exercise encouraged Continue all meds Follow up  In 3 months   [place], FNP

## 2014-02-15 NOTE — Patient Instructions (Signed)
Diabetes and Foot Care Diabetes may cause you to have problems because of poor blood supply (circulation) to your feet and legs. This may cause the skin on your feet to become thinner, break easier, and heal more slowly. Your skin may become dry, and the skin may peel and crack. You may also have nerve damage in your legs and feet causing decreased feeling in them. You may not notice minor injuries to your feet that could lead to infections or more serious problems. Taking care of your feet is one of the most important things you can do for yourself.  HOME CARE INSTRUCTIONS  Wear shoes at all times, even in the house. Do not go barefoot. Bare feet are easily injured.  Check your feet daily for blisters, cuts, and redness. If you cannot see the bottom of your feet, use a mirror or ask someone for help.  Wash your feet with warm water (do not use hot water) and mild soap. Then pat your feet and the areas between your toes until they are completely dry. Do not soak your feet as this can dry your skin.  Apply a moisturizing lotion or petroleum jelly (that does not contain alcohol and is unscented) to the skin on your feet and to dry, brittle toenails. Do not apply lotion between your toes.  Trim your toenails straight across. Do not dig under them or around the cuticle. File the edges of your nails with an emery board or nail file.  Do not cut corns or calluses or try to remove them with medicine.  Wear clean socks or stockings every day. Make sure they are not too tight. Do not wear knee-high stockings since they may decrease blood flow to your legs.  Wear shoes that fit properly and have enough cushioning. To break in new shoes, wear them for just a few hours a day. This prevents you from injuring your feet. Always look in your shoes before you put them on to be sure there are no objects inside.  Do not cross your legs. This may decrease the blood flow to your feet.  If you find a minor scrape,  cut, or break in the skin on your feet, keep it and the skin around it clean and dry. These areas may be cleansed with mild soap and water. Do not cleanse the area with peroxide, alcohol, or iodine.  When you remove an adhesive bandage, be sure not to damage the skin around it.  If you have a wound, look at it several times a day to make sure it is healing.  Do not use heating pads or hot water bottles. They may burn your skin. If you have lost feeling in your feet or legs, you may not know it is happening until it is too late.  Make sure your health care provider performs a complete foot exam at least annually or more often if you have foot problems. Report any cuts, sores, or bruises to your health care provider immediately. SEEK MEDICAL CARE IF:   You have an injury that is not healing.  You have cuts or breaks in the skin.  You have an ingrown nail.  You notice redness on your legs or feet.  You feel burning or tingling in your legs or feet.  You have pain or cramps in your legs and feet.  Your legs or feet are numb.  Your feet always feel cold. SEEK IMMEDIATE MEDICAL CARE IF:   There is increasing redness,   swelling, or pain in or around a wound.  There is a red line that goes up your leg.  Pus is coming from a wound.  You develop a fever or as directed by your health care provider.  You notice a bad smell coming from an ulcer or wound. Document Released: 12/10/2000 Document Revised: 08/15/2013 Document Reviewed: 05/22/2013 [employer] Patient Information 2014 [employer].  

## 2014-02-18 LAB — NMR, LIPOPROFILE
Cholesterol: 168 mg/dL (ref <200)
HDL Cholesterol by NMR: 62 mg/dL (ref >=40)
HDL Particle Number: 37.7 umol/L (ref >=30.5)
LDL Particle Number: 1551 nmol/L — ABNORMAL HIGH (ref <1000)
LDL Size: 20.5 nm — ABNORMAL LOW (ref >20.5)
LDLC SERPL CALC-MCNC: 89 mg/dL (ref <100)
LP-IR Score: 30 (ref <=45)
Small LDL Particle Number: 977 nmol/L — ABNORMAL HIGH (ref <=527)
Triglycerides by NMR: 83 mg/dL (ref <150)

## 2014-02-18 LAB — CMP14+EGFR
A/G RATIO: 1.7 (ref 1.1–2.5)
ALT: 22 IU/L (ref 0–32)
AST: 18 IU/L (ref 0–40)
Albumin: 4.7 g/dL (ref 3.5–5.5)
Alkaline Phosphatase: 49 IU/L (ref 39–117)
BUN / CREAT RATIO: 22 (ref 9–23)
BUN: 15 mg/dL (ref 6–24)
CO2: 26 mmol/L (ref 18–29)
Calcium: 9.8 mg/dL (ref 8.7–10.2)
Chloride: 99 mmol/L (ref 97–108)
Creatinine, Ser: 0.68 mg/dL (ref 0.57–1.00)
GFR calc Af Amer: 112 mL/min/{1.73_m2} (ref >59)
GFR, EST NON AFRICAN AMERICAN: 97 mL/min/{1.73_m2} (ref >59)
GLUCOSE: 104 mg/dL — AB (ref 65–99)
Globulin, Total: 2.7 g/dL (ref 1.5–4.5)
Potassium: 4.8 mmol/L (ref 3.5–5.2)
Sodium: 141 mmol/L (ref 134–144)
TOTAL PROTEIN: 7.4 g/dL (ref 6.0–8.5)
Total Bilirubin: 0.3 mg/dL (ref 0.0–1.2)

## 2014-05-21 ENCOUNTER — Ambulatory Visit (INDEPENDENT_AMBULATORY_CARE_PROVIDER_SITE_OTHER): Payer: BC Managed Care – PPO | Admitting: Nurse Practitioner

## 2014-05-21 ENCOUNTER — Encounter: Payer: Self-pay | Admitting: Nurse Practitioner

## 2014-05-21 VITALS — BP 133/70 | HR 54 | Temp 98.3°F | Ht 62.0 in | Wt 169.0 lb

## 2014-05-21 DIAGNOSIS — E785 Hyperlipidemia, unspecified: Secondary | ICD-10-CM

## 2014-05-21 DIAGNOSIS — K219 Gastro-esophageal reflux disease without esophagitis: Secondary | ICD-10-CM

## 2014-05-21 DIAGNOSIS — E119 Type 2 diabetes mellitus without complications: Secondary | ICD-10-CM

## 2014-05-21 DIAGNOSIS — E559 Vitamin D deficiency, unspecified: Secondary | ICD-10-CM

## 2014-05-21 LAB — POCT GLYCOSYLATED HEMOGLOBIN (HGB A1C): Hemoglobin A1C: 6

## 2014-05-21 MED ORDER — FESOTERODINE FUMARATE ER 8 MG PO TB24: 8.0000 mg | ORAL_TABLET | Freq: Every day | ORAL | Status: DC

## 2014-05-21 MED ORDER — ROSUVASTATIN CALCIUM 10 MG PO TABS: 10.0000 mg | ORAL_TABLET | Freq: Every day | ORAL | Status: DC

## 2014-05-21 MED ORDER — ESOMEPRAZOLE MAGNESIUM 40 MG PO CPDR: 40.0000 mg | DELAYED_RELEASE_CAPSULE | Freq: Every day | ORAL | Status: DC | PRN

## 2014-05-21 MED ORDER — METFORMIN HCL 500 MG PO TABS: 500.0000 mg | ORAL_TABLET | Freq: Every day | ORAL | Status: DC

## 2014-05-21 NOTE — Progress Notes (Signed)
Subjective:    Patient ID: Katherine Ryan, female    DOB: 1956/04/16, 58 y.o.   MRN: [id number]  Hyperlipidemia This is a chronic problem. The current episode started more than 1 year ago. The problem is controlled. Recent lipid tests were reviewed and are normal. Exacerbating diseases include diabetes. There are no known factors aggravating her hyperlipidemia. Pertinent negatives include no focal weakness, leg pain or myalgias. Current antihyperlipidemic treatment includes statins. The current treatment provides significant improvement of lipids. There are no compliance problems.  Risk factors for coronary artery disease include diabetes mellitus.  Diabetes She presents for her follow-up diabetic visit. She has type 2 diabetes mellitus. No MedicAlert identification noted. The initial diagnosis of diabetes was made 4 months ago. Her disease course has been improving. There are no hypoglycemic associated symptoms. Pertinent negatives for diabetes include no blurred vision, no fatigue, no foot paresthesias, no polydipsia, no polyphagia, no polyuria, no weakness and no weight loss. There are no hypoglycemic complications. Symptoms are stable. Pertinent negatives for diabetic complications include no heart disease. Risk factors for coronary artery disease include dyslipidemia. Current diabetic treatment includes diet and oral agent (monotherapy). She is compliant with treatment all of the time. Her weight is stable. She is following a diabetic and generally healthy diet. When asked about meal planning, she reported none. She has not had a previous visit with a dietician. She participates in exercise intermittently. Home blood sugar record trend: Patient has never check blood sugars at home. An ACE inhibitor/angiotensin II receptor blocker is not being taken. She does not see a podiatrist.Eye exam current: January 2014.  GERD Nexium works well- no symptoms when takes meds  Review of Systems  Constitutional:  Negative for weight loss and fatigue.  HENT: Negative.   Eyes: Negative.  Negative for blurred vision.  Respiratory: Negative.   Cardiovascular: Negative.   Gastrointestinal: Negative.   Endocrine: Negative for polydipsia, polyphagia and polyuria.  Genitourinary: Negative.   Musculoskeletal: Negative.  Negative for myalgias.  Neurological: Negative.  Negative for focal weakness and weakness.  Psychiatric/Behavioral: Negative.        Objective:   Physical Exam  Constitutional: She is oriented to person, place, and time. She appears well-developed and well-nourished.  HENT:  Nose: Nose normal.  Mouth/Throat: Oropharynx is clear and moist.  Eyes: EOM are normal.  Neck: Trachea normal, normal range of motion and full passive range of motion without pain. Neck supple. No JVD present. Carotid bruit is not present. No thyromegaly present.  Cardiovascular: Normal rate, regular rhythm, normal heart sounds and intact distal pulses.  Exam reveals no gallop and no friction rub.   No murmur heard. Pulmonary/Chest: Effort normal and breath sounds normal.  Abdominal: Soft. Bowel sounds are normal. She exhibits no distension and no mass. There is no tenderness.  Musculoskeletal: Normal range of motion.  Lymphadenopathy:    She has no cervical adenopathy.  Neurological: She is alert and oriented to person, place, and time. She has normal reflexes.  (+) 4/4 monofilament bil  Skin: Skin is warm and dry.  No callus on feet  Psychiatric: She has a normal mood and affect. Her behavior is normal. Judgment and thought content normal.   BP 133/70  Pulse 54  Temp(Src) 98.3 F (36.8 C) (Oral)  Ht '5\' 2"'  (1.575 m)  Wt 169 lb (76.658 kg)  BMI 30.90 kg/m2  LMP 04/04/2005 Results for orders placed in visit on 05/21/14  POCT GLYCOSYLATED HEMOGLOBIN (HGB A1C)  Result Value Ref Range   Hemoglobin A1C 6.0%          Assessment & Plan:   1. Hyperlipidemia LDL goal < 100   2. Type II or  unspecified type diabetes mellitus without mention of complication, not stated as uncontrolled   3. GERD (gastroesophageal reflux disease)   4. Vitamin D deficiency    Orders Placed This Encounter  Procedures  . CMP14+EGFR  . NMR, lipoprofile  . POCT glycosylated hemoglobin (Hb A1C)   Meds ordered this encounter  Medications  . metFORMIN (GLUCOPHAGE) 500 MG tablet    Sig: Take 1 tablet (500 mg total) by mouth daily with breakfast.    Dispense:  30 tablet    Refill:  5    Order Specific Question:  Supervising Provider    Answer:  Chipper Herb [[id number]]  . esomeprazole (NEXIUM) 40 MG capsule    Sig: Take 1 capsule (40 mg total) by mouth daily as needed.    Dispense:  30 capsule    Refill:  5    Order Specific Question:  Supervising Provider    Answer:  Chipper Herb [[id number]]  . rosuvastatin (CRESTOR) 10 MG tablet    Sig: Take 1 tablet (10 mg total) by mouth daily.    Dispense:  30 tablet    Refill:  5    Order Specific Question:  Supervising Provider    Answer:  Chipper Herb [[id number]]  . fesoterodine (TOVIAZ) 8 MG TB24 tablet    Sig: Take 1 tablet (8 mg total) by mouth daily.    Dispense:  30 tablet    Refill:  5    Order Specific Question:  Supervising Provider    Answer:  Chipper Herb [[id number]]    Patient to make appointment for eye exam Continue all meds Labs pending Diet and exercise encouraged Health maintenance reviewed Follow up in 3 months hemocult cards given  [name], FNP

## 2014-05-22 LAB — NMR, LIPOPROFILE
Cholesterol: 160 mg/dL (ref 100–199)
HDL Cholesterol by NMR: 67 mg/dL (ref >39)
HDL Particle Number: 39 umol/L (ref >=30.5)
LDL PARTICLE NUMBER: 1085 nmol/L — AB (ref <1000)
LDL Size: 20.5 nm (ref >20.5)
LDLC SERPL CALC-MCNC: 79 mg/dL (ref 0–99)
LP-IR Score: 33 (ref <=45)
Small LDL Particle Number: 496 nmol/L (ref <=527)
TRIGLYCERIDES BY NMR: 68 mg/dL (ref 0–149)

## 2014-05-22 LAB — CMP14+EGFR
ALBUMIN: 4.7 g/dL (ref 3.5–5.5)
ALT: 38 IU/L — ABNORMAL HIGH (ref 0–32)
AST: 29 IU/L (ref 0–40)
Albumin/Globulin Ratio: 2 (ref 1.1–2.5)
Alkaline Phosphatase: 54 IU/L (ref 39–117)
BILIRUBIN TOTAL: 0.2 mg/dL (ref 0.0–1.2)
BUN / CREAT RATIO: 22 (ref 9–23)
BUN: 15 mg/dL (ref 6–24)
CHLORIDE: 101 mmol/L (ref 97–108)
CO2: 28 mmol/L (ref 18–29)
CREATININE: 0.69 mg/dL (ref 0.57–1.00)
Calcium: 9.3 mg/dL (ref 8.7–10.2)
GFR calc non Af Amer: 97 mL/min/{1.73_m2} (ref >59)
GFR, EST AFRICAN AMERICAN: 112 mL/min/{1.73_m2} (ref >59)
Globulin, Total: 2.4 g/dL (ref 1.5–4.5)
Glucose: 117 mg/dL — ABNORMAL HIGH (ref 65–99)
Potassium: 4.3 mmol/L (ref 3.5–5.2)
Sodium: 140 mmol/L (ref 134–144)
TOTAL PROTEIN: 7.1 g/dL (ref 6.0–8.5)

## 2014-08-15 ENCOUNTER — Encounter: Payer: Self-pay | Admitting: Nurse Practitioner

## 2014-08-15 ENCOUNTER — Ambulatory Visit (INDEPENDENT_AMBULATORY_CARE_PROVIDER_SITE_OTHER): Payer: BC Managed Care – PPO | Admitting: Nurse Practitioner

## 2014-08-15 VITALS — BP 161/80 | HR 65 | Temp 97.4°F | Ht 62.0 in | Wt 173.8 lb

## 2014-08-15 DIAGNOSIS — M25562 Pain in left knee: Secondary | ICD-10-CM

## 2014-08-15 DIAGNOSIS — M25569 Pain in unspecified knee: Secondary | ICD-10-CM

## 2014-08-15 MED ORDER — PREDNISONE 20 MG PO TABS: ORAL_TABLET | ORAL | Status: DC

## 2014-08-15 MED ORDER — METHYLPREDNISOLONE ACETATE 80 MG/ML IJ SUSP
80.0000 mg | Freq: Once | INTRAMUSCULAR | Status: AC
  Administered 2014-08-15: 80 mg via INTRAMUSCULAR

## 2014-08-15 NOTE — Patient Instructions (Signed)

## 2014-08-15 NOTE — Progress Notes (Signed)
   Subjective:    Patient ID: Katherine Ryan, female    DOB: July 23, 1956, 58 y.o.   MRN: [id number]  HPI [place] on Sunday from a ladder with landing on both feet with no other injuries but having left knee pain.  Taking Ibuprofen with relief.  Some edema yesterday after working and being on feet for 12 hours.  Pain and clicking sound with bending.  Has had previous left knee surgery-fracture of tibia 20 years ago    Review of Systems  Constitutional: Negative.   Respiratory: Negative.   Cardiovascular: Negative.   Musculoskeletal:       Edema and pain in left knee below patella with flexion and extension  Skin: Negative.   Neurological: Negative.        Objective:   Physical Exam  Constitutional: She is oriented to person, place, and time. She appears well-developed and well-nourished.  Cardiovascular: Normal rate, regular rhythm and normal heart sounds.   Pulmonary/Chest: Effort normal and breath sounds normal.  Musculoskeletal:  Pain distal of patella with flexion with no edema. No  weakness  Neurological: She is alert and oriented to person, place, and time.  Skin: Skin is warm and dry.  Psychiatric: She has a normal mood and affect. Her behavior is normal. Judgment and thought content normal.    BP 161/80  Pulse 65  Temp(Src) 97.4 F (36.3 C) (Oral)  Ht 5\' 2"  (1.575 m)  Wt 173 lb 12.8 oz (78.835 kg)  BMI 31.78 kg/m2  LMP 04/04/2005       Assessment & Plan:   1. Left knee pain    Meds ordered this encounter  Medications  . ibuprofen (ADVIL,MOTRIN) 400 MG tablet    Sig: Take 400 mg by mouth 2 (two) times daily.  . methylPREDNISolone acetate (DEPO-MEDROL) injection 80 mg    Sig:   . predniSONE (DELTASONE) 20 MG tablet    Sig: 2 pills at same time daily but do not start until tomorrow    Dispense:  10 tablet    Refill:  0    Order Specific Question:  Supervising Provider    Answer:  Chipper Herb [[id number]]  given prednisone because NSAIDS upset her  stomach Ice Elevate when sitting RTO if not improving in 1 week  [place], FNP

## 2014-08-26 ENCOUNTER — Encounter: Payer: Self-pay | Admitting: Nurse Practitioner

## 2014-08-26 ENCOUNTER — Telehealth: Payer: Self-pay | Admitting: Nurse Practitioner

## 2014-08-26 ENCOUNTER — Ambulatory Visit (INDEPENDENT_AMBULATORY_CARE_PROVIDER_SITE_OTHER): Payer: BC Managed Care – PPO | Admitting: Nurse Practitioner

## 2014-08-26 VITALS — BP 152/80 | HR 65 | Temp 97.6°F | Ht 62.0 in | Wt 173.0 lb

## 2014-08-26 DIAGNOSIS — M25569 Pain in unspecified knee: Secondary | ICD-10-CM

## 2014-08-26 DIAGNOSIS — E119 Type 2 diabetes mellitus without complications: Secondary | ICD-10-CM

## 2014-08-26 DIAGNOSIS — E785 Hyperlipidemia, unspecified: Secondary | ICD-10-CM

## 2014-08-26 DIAGNOSIS — M25562 Pain in left knee: Secondary | ICD-10-CM

## 2014-08-26 DIAGNOSIS — K219 Gastro-esophageal reflux disease without esophagitis: Secondary | ICD-10-CM

## 2014-08-26 LAB — POCT GLYCOSYLATED HEMOGLOBIN (HGB A1C): HEMOGLOBIN A1C: 6.2

## 2014-08-26 LAB — POCT UA - MICROALBUMIN: MICROALBUMIN (UR) POC: NEGATIVE mg/L

## 2014-08-26 MED ORDER — ROSUVASTATIN CALCIUM 10 MG PO TABS: 10.0000 mg | ORAL_TABLET | Freq: Every day | ORAL | Status: DC

## 2014-08-26 MED ORDER — CELECOXIB 100 MG PO CAPS: 100.0000 mg | ORAL_CAPSULE | Freq: Two times a day (BID) | ORAL | Status: DC

## 2014-08-26 NOTE — Patient Instructions (Signed)

## 2014-08-26 NOTE — Telephone Encounter (Signed)
Patient aware.

## 2014-08-26 NOTE — Progress Notes (Signed)
Subjective:    Patient ID: Katherine Ryan, female    DOB: June 18, 1956, 58 y.o.   MRN: [id number]  HPI Follow up for chronic medical problems.  Still having left knee pain after having a joint injection of steroids 2 weeks ago.  Has used Mobic in the past for back pain but has GI pain when taking it.    Hyperlipidemia This is a chronic problem. The current episode started more than 1 year ago. The problem is controlled. Recent lipid tests were reviewed and are normal. Exacerbating diseases include diabetes. There are no known factors aggravating her hyperlipidemia. Pertinent negatives include no focal weakness, leg pain or myalgias. Current antihyperlipidemic treatment includes statins. The current treatment provides significant improvement of lipids. There are no compliance problems.  Risk factors for coronary artery disease include diabetes mellitus.  Diabetes She presents for her follow-up diabetic visit. She has type 2 diabetes mellitus. No MedicAlert identification noted. The initial diagnosis of diabetes was made 4 months ago. Her disease course has been improving. There are no hypoglycemic associated symptoms. Pertinent negatives for diabetes include no blurred vision, no fatigue, no foot paresthesias, no polydipsia, no polyphagia, no polyuria, no weakness and no weight loss. There are no hypoglycemic complications. Symptoms are stable. Pertinent negatives for diabetic complications include no heart disease. Risk factors for coronary artery disease include dyslipidemia. Current diabetic treatment includes diet and oral agent (monotherapy). She is compliant with treatment all of the time. Her weight is stable. She is following a diabetic and generally healthy diet. When asked about meal planning, she reported none. She has not had a previous visit with a dietician. She participates in exercise intermittently. Home blood sugar record trend: Patient has never check blood sugars at home. An ACE  inhibitor/angiotensin II receptor blocker is not being taken. She does not see a podiatrist.Eye exam current: January 2014.  GERD Nexium works well- no symptoms when takes meds  Review of Systems  Constitutional: Negative for weight loss and fatigue.  HENT: Negative.   Eyes: Negative.  Negative for blurred vision.  Respiratory: Negative.   Cardiovascular: Negative.   Gastrointestinal: Negative.   Endocrine: Negative for polydipsia, polyphagia and polyuria.  Genitourinary: Negative.   Musculoskeletal: Positive for arthralgias. Negative for myalgias.       Left knee  Neurological: Negative.  Negative for focal weakness and weakness.  Psychiatric/Behavioral: Negative.        Objective:   Physical Exam  Constitutional: She is oriented to person, place, and time. She appears well-developed and well-nourished.  HENT:  Nose: Nose normal.  Mouth/Throat: Oropharynx is clear and moist.  Eyes: EOM are normal.  Neck: Trachea normal, normal range of motion and full passive range of motion without pain. Neck supple. No JVD present. Carotid bruit is not present. No thyromegaly present.  Cardiovascular: Normal rate, regular rhythm, normal heart sounds and intact distal pulses.  Exam reveals no gallop and no friction rub.   No murmur heard. Pulmonary/Chest: Effort normal and breath sounds normal.  Abdominal: Soft. Bowel sounds are normal. She exhibits no distension and no mass. There is no tenderness.  Musculoskeletal: Normal range of motion.  FROM of left knee without pain- no effusion- no patella tenderness- all ligaments intact  Lymphadenopathy:    She has no cervical adenopathy.  Neurological: She is alert and oriented to person, place, and time. She has normal reflexes.  (+) 4/4 monofilament bil  Skin: Skin is warm and dry.  No callus on feet  Psychiatric: She  has a normal mood and affect. Her behavior is normal. Judgment and thought content normal.   BP 152/80  Pulse 65  Temp(Src)  97.6 F (36.4 C) (Oral)  Ht '5\' 2"'  (1.575 m)  Wt 173 lb (78.472 kg)  BMI 31.63 kg/m2  LMP 04/04/2005  Results for orders placed in visit on 08/26/14  POCT GLYCOSYLATED HEMOGLOBIN (HGB A1C)      Result Value Ref Range   Hemoglobin A1C 6.2    POCT UA - MICROALBUMIN      Result Value Ref Range   Microalbumin Ur, POC neg     Creatinine, POC             Assessment & Plan:   1. Hyperlipidemia with target LDL less than 100   2. Type II or unspecified type diabetes mellitus without mention of complication, not stated as uncontrolled   3. Gastroesophageal reflux disease without esophagitis   4. Left knee pain    Orders Placed This Encounter  Procedures  . CMP14+EGFR  . NMR, lipoprofile  . POCT glycosylated hemoglobin (Hb A1C)  . POCT UA - Microalbumin   Meds ordered this encounter  Medications  . rosuvastatin (CRESTOR) 10 MG tablet    Sig: Take 1 tablet (10 mg total) by mouth daily.    Dispense:  30 tablet    Refill:  5    Order Specific Question:  Supervising Provider    Answer:  Chipper Herb [[id number]]  . celecoxib (CELEBREX) 100 MG capsule    Sig: Take 1 capsule (100 mg total) by mouth 2 (two) times daily.    Dispense:  30 capsule    Refill:  2    Can't tolerate NSAIDS    Order Specific Question:  Supervising Provider    Answer:  Chipper Herb [[id number]]   A. Duane Boston gave her ultram for back pain years ago- I do not want o do that at this time.- will try celebrex Labs pending Health maintenance reviewed Diet and exercise encouraged Continue all meds Follow up  In 3 month   [place], FNP

## 2014-08-27 LAB — CMP14+EGFR
A/G RATIO: 2 (ref 1.1–2.5)
ALT: 24 IU/L (ref 0–32)
AST: 19 IU/L (ref 0–40)
Albumin: 4.7 g/dL (ref 3.5–5.5)
Alkaline Phosphatase: 58 IU/L (ref 39–117)
BUN/Creatinine Ratio: 26 — ABNORMAL HIGH (ref 9–23)
BUN: 18 mg/dL (ref 6–24)
CO2: 25 mmol/L (ref 18–29)
Calcium: 9.1 mg/dL (ref 8.7–10.2)
Chloride: 100 mmol/L (ref 97–108)
Creatinine, Ser: 0.68 mg/dL (ref 0.57–1.00)
GFR calc non Af Amer: 97 mL/min/{1.73_m2} (ref >59)
GFR, EST AFRICAN AMERICAN: 112 mL/min/{1.73_m2} (ref >59)
Globulin, Total: 2.4 g/dL (ref 1.5–4.5)
Glucose: 124 mg/dL — ABNORMAL HIGH (ref 65–99)
Potassium: 5 mmol/L (ref 3.5–5.2)
SODIUM: 140 mmol/L (ref 134–144)
Total Bilirubin: 0.3 mg/dL (ref 0.0–1.2)
Total Protein: 7.1 g/dL (ref 6.0–8.5)

## 2014-08-27 LAB — NMR, LIPOPROFILE
Cholesterol: 177 mg/dL (ref 100–199)
HDL Cholesterol by NMR: 76 mg/dL (ref >39)
HDL Particle Number: 48.9 umol/L (ref >=30.5)
LDL PARTICLE NUMBER: 1339 nmol/L — AB (ref <1000)
LDL Size: 20.5 nm (ref >20.5)
LDLC SERPL CALC-MCNC: 88 mg/dL (ref 0–99)
LP-IR SCORE: 33 (ref <=45)
SMALL LDL PARTICLE NUMBER: 649 nmol/L — AB (ref <=527)
Triglycerides by NMR: 65 mg/dL (ref 0–149)

## 2014-08-28 ENCOUNTER — Telehealth: Payer: Self-pay | Admitting: Family Medicine

## 2014-08-28 NOTE — Telephone Encounter (Signed)
Patient aware.

## 2014-08-28 NOTE — Telephone Encounter (Signed)
Message copied by Waverly Ferrari on Wed Aug 28, 2014 12:28 PM ------      Message from: Chevis Pretty      Created: Tue Aug 27, 2014 11:56 AM       Hgba1c discussed at appointment      microalbumin negative      Kidney and liver function stable      ldl are going up need to watch diet      Continue current meds- low fat diet and exercise and recheck in 3 months       ------

## 2014-12-02 ENCOUNTER — Other Ambulatory Visit: Payer: Self-pay | Admitting: Nurse Practitioner

## 2014-12-11 ENCOUNTER — Encounter: Payer: Self-pay | Admitting: Nurse Practitioner

## 2014-12-11 ENCOUNTER — Ambulatory Visit (INDEPENDENT_AMBULATORY_CARE_PROVIDER_SITE_OTHER): Payer: BC Managed Care – PPO | Admitting: Nurse Practitioner

## 2014-12-11 VITALS — BP 138/82 | HR 71 | Temp 99.2°F | Ht 62.0 in | Wt 179.0 lb

## 2014-12-11 DIAGNOSIS — E119 Type 2 diabetes mellitus without complications: Secondary | ICD-10-CM

## 2014-12-11 DIAGNOSIS — R3915 Urgency of urination: Secondary | ICD-10-CM

## 2014-12-11 DIAGNOSIS — E785 Hyperlipidemia, unspecified: Secondary | ICD-10-CM

## 2014-12-11 DIAGNOSIS — K219 Gastro-esophageal reflux disease without esophagitis: Secondary | ICD-10-CM

## 2014-12-11 LAB — POCT GLYCOSYLATED HEMOGLOBIN (HGB A1C): Hemoglobin A1C: 5.9

## 2014-12-11 MED ORDER — ROSUVASTATIN CALCIUM 10 MG PO TABS: 10.0000 mg | ORAL_TABLET | Freq: Every day | ORAL | Status: DC

## 2014-12-11 MED ORDER — METFORMIN HCL 500 MG PO TABS: ORAL_TABLET | ORAL | Status: DC

## 2014-12-11 MED ORDER — ESOMEPRAZOLE MAGNESIUM 40 MG PO CPDR: 40.0000 mg | DELAYED_RELEASE_CAPSULE | Freq: Every day | ORAL | Status: DC | PRN

## 2014-12-11 MED ORDER — FESOTERODINE FUMARATE ER 8 MG PO TB24: 8.0000 mg | ORAL_TABLET | Freq: Every day | ORAL | Status: DC

## 2014-12-11 NOTE — Patient Instructions (Signed)

## 2014-12-11 NOTE — Progress Notes (Addendum)
Subjective:    Patient ID: Katherine Ryan, female    DOB: Mar 17, 1956, 58 y.o.   MRN: [id number]  HPI Follow up for chronic medical problems.   Patient smashed left ring finger in machine at work and is a litte tender to touch.  Hyperlipidemia This is a chronic problem. The current episode started more than 1 year ago. The problem is controlled. Recent lipid tests were reviewed and are normal. Exacerbating diseases include diabetes and obesity. She has no history of hypothyroidism. Pertinent negatives include no myalgias. Current antihyperlipidemic treatment includes statins. The current treatment provides moderate improvement of lipids. Compliance problems include adherence to diet and adherence to exercise.   Diabetes She presents for her follow-up diabetic visit. She has type 2 diabetes mellitus. No MedicAlert identification noted. Her disease course has been stable. Pertinent negatives for diabetes include no fatigue, no polydipsia, no polyphagia, no polyuria and no weakness. There are no hypoglycemic complications. Symptoms are stable. There are no diabetic complications. Risk factors for coronary artery disease include diabetes mellitus, dyslipidemia, obesity and post-menopausal. Current diabetic treatment includes oral agent (monotherapy). She is compliant with treatment most of the time. Her weight is stable. She is following a diabetic diet. She has not had a previous visit with a dietitian. She participates in exercise daily. Her breakfast blood glucose range is generally 90-110 mg/dl. Her overall blood glucose range is 90-110 mg/dl. An ACE inhibitor/angiotensin II receptor blocker is not being taken. She does not see a podiatrist.Eye exam is current.  GERD Nexium works well- no symptoms when takes meds  Review of Systems  Constitutional: Negative for fatigue.  HENT: Negative.   Eyes: Negative.   Respiratory: Negative.   Cardiovascular: Negative.   Gastrointestinal: Negative.    Endocrine: Negative for polydipsia, polyphagia and polyuria.  Genitourinary: Negative.   Musculoskeletal: Positive for arthralgias. Negative for myalgias.       Left knee  Neurological: Negative.  Negative for weakness.  Psychiatric/Behavioral: Negative.        Objective:   Physical Exam  Constitutional: She is oriented to person, place, and time. She appears well-developed and well-nourished.  HENT:  Nose: Nose normal.  Mouth/Throat: Oropharynx is clear and moist.  Eyes: EOM are normal.  Neck: Trachea normal, normal range of motion and full passive range of motion without pain. Neck supple. No JVD present. Carotid bruit is not present. No thyromegaly present.  Cardiovascular: Normal rate, regular rhythm, normal heart sounds and intact distal pulses.  Exam reveals no gallop and no friction rub.   No murmur heard. Pulmonary/Chest: Effort normal and breath sounds normal.  Abdominal: Soft. Bowel sounds are normal. She exhibits no distension and no mass. There is no tenderness.  Musculoskeletal: Normal range of motion.  Lymphadenopathy:    She has no cervical adenopathy.  Neurological: She is alert and oriented to person, place, and time. She has normal reflexes.  Skin: Skin is warm and dry.  Left ring finger edematous and red around nail bed  Psychiatric: She has a normal mood and affect. Her behavior is normal. Judgment and thought content normal.   BP 138/82 mmHg  Pulse 71  Temp(Src) 99.2 F (37.3 C) (Oral)  Ht '5\' 2"'  (1.575 m)  Wt 179 lb (81.194 kg)  BMI 32.73 kg/m2  LMP 04/04/2005   Results for orders placed or performed in visit on 12/11/14  POCT glycosylated hemoglobin (Hb A1C)  Result Value Ref Range   Hemoglobin A1C 5.9%  Assessment & Plan:   1. Hyperlipidemia with target LDL less than 100 Low fat diet - NMR, lipoprofile - rosuvastatin (CRESTOR) 10 MG tablet; Take 1 tablet (10 mg total) by mouth daily.  Dispense: 30 tablet; Refill: 5  2. Type 2  diabetes mellitus without complication Continue exercise and carb counting - POCT glycosylated hemoglobin (Hb A1C) - CMP14+EGFR - metFORMIN (GLUCOPHAGE) 500 MG tablet; TAKE ONE TABLET BY MOUTH ONCE DAILY WITH  BREAKFAST  Dispense: 30 tablet; Refill: 5  3. Gastroesophageal reflux disease without esophagitis Avoid spicy foods - esomeprazole (NEXIUM) 40 MG capsule; Take 1 capsule (40 mg total) by mouth daily as needed.  Dispense: 30 capsule; Refill: 5  4. Urinary urgency - fesoterodine (TOVIAZ) 8 MG TB24 tablet; Take 1 tablet (8 mg total) by mouth daily.  Dispense: 30 tablet; Refill: 5   hemoccult cards given to patient with directions Refuses immunizations Labs pending Health maintenance reviewed Diet and exercise encouraged Continue all meds Follow up  In 3 months   [place], FNP

## 2014-12-12 ENCOUNTER — Other Ambulatory Visit: Payer: BC Managed Care – PPO

## 2014-12-12 DIAGNOSIS — Z1212 Encounter for screening for malignant neoplasm of rectum: Secondary | ICD-10-CM

## 2014-12-12 LAB — CMP14+EGFR
ALT: 29 IU/L (ref 0–32)
AST: 26 IU/L (ref 0–40)
Albumin/Globulin Ratio: 1.6 (ref 1.1–2.5)
Albumin: 4.6 g/dL (ref 3.5–5.5)
Alkaline Phosphatase: 56 IU/L (ref 39–117)
BUN/Creatinine Ratio: 19 (ref 9–23)
BUN: 13 mg/dL (ref 6–24)
CO2: 27 mmol/L (ref 18–29)
Calcium: 9.9 mg/dL (ref 8.7–10.2)
Chloride: 99 mmol/L (ref 97–108)
Creatinine, Ser: 0.7 mg/dL (ref 0.57–1.00)
GFR calc Af Amer: 110 mL/min/1.73
GFR calc non Af Amer: 96 mL/min/1.73
Globulin, Total: 2.9 g/dL (ref 1.5–4.5)
Glucose: 127 mg/dL — ABNORMAL HIGH (ref 65–99)
Potassium: 4.7 mmol/L (ref 3.5–5.2)
Sodium: 139 mmol/L (ref 134–144)
Total Bilirubin: 0.3 mg/dL (ref 0.0–1.2)
Total Protein: 7.5 g/dL (ref 6.0–8.5)

## 2014-12-12 LAB — NMR, LIPOPROFILE
CHOLESTEROL: 170 mg/dL (ref 100–199)
HDL Cholesterol by NMR: 74 mg/dL (ref >39)
HDL PARTICLE NUMBER: 43.4 umol/L (ref >=30.5)
LDL Particle Number: 1041 nmol/L — ABNORMAL HIGH (ref <1000)
LDL SIZE: 20.7 nm (ref >20.5)
LDL-C: 78 mg/dL (ref 0–99)
LP-IR Score: 33 (ref <=45)
SMALL LDL PARTICLE NUMBER: 447 nmol/L (ref <=527)
TRIGLYCERIDES BY NMR: 88 mg/dL (ref 0–149)

## 2014-12-12 NOTE — Progress Notes (Signed)
Lab only 

## 2014-12-14 LAB — FECAL OCCULT BLOOD, IMMUNOCHEMICAL: Fecal Occult Bld: NEGATIVE

## 2015-03-17 ENCOUNTER — Ambulatory Visit (INDEPENDENT_AMBULATORY_CARE_PROVIDER_SITE_OTHER): Payer: BLUE CROSS/BLUE SHIELD | Admitting: Nurse Practitioner

## 2015-03-17 ENCOUNTER — Encounter: Payer: Self-pay | Admitting: Nurse Practitioner

## 2015-03-17 ENCOUNTER — Ambulatory Visit (INDEPENDENT_AMBULATORY_CARE_PROVIDER_SITE_OTHER): Payer: BLUE CROSS/BLUE SHIELD

## 2015-03-17 VITALS — BP 139/83 | HR 67 | Temp 97.4°F | Ht 62.0 in | Wt 181.0 lb

## 2015-03-17 DIAGNOSIS — E119 Type 2 diabetes mellitus without complications: Secondary | ICD-10-CM

## 2015-03-17 DIAGNOSIS — E785 Hyperlipidemia, unspecified: Secondary | ICD-10-CM

## 2015-03-17 DIAGNOSIS — E559 Vitamin D deficiency, unspecified: Secondary | ICD-10-CM

## 2015-03-17 DIAGNOSIS — K219 Gastro-esophageal reflux disease without esophagitis: Secondary | ICD-10-CM | POA: Diagnosis not present

## 2015-03-17 DIAGNOSIS — R42 Dizziness and giddiness: Secondary | ICD-10-CM | POA: Diagnosis not present

## 2015-03-17 DIAGNOSIS — R3915 Urgency of urination: Secondary | ICD-10-CM | POA: Diagnosis not present

## 2015-03-17 LAB — POCT GLYCOSYLATED HEMOGLOBIN (HGB A1C): Hemoglobin A1C: 6.3

## 2015-03-17 MED ORDER — ESOMEPRAZOLE MAGNESIUM 40 MG PO CPDR
40.0000 mg | DELAYED_RELEASE_CAPSULE | Freq: Every day | ORAL | Status: DC | PRN
Start: 2015-03-17 — End: 2015-10-16

## 2015-03-17 MED ORDER — FESOTERODINE FUMARATE ER 8 MG PO TB24: 8.0000 mg | ORAL_TABLET | Freq: Every day | ORAL | Status: DC

## 2015-03-17 MED ORDER — ROSUVASTATIN CALCIUM 10 MG PO TABS: 10.0000 mg | ORAL_TABLET | Freq: Every day | ORAL | Status: DC

## 2015-03-17 MED ORDER — METFORMIN HCL 500 MG PO TABS: ORAL_TABLET | ORAL | Status: DC

## 2015-03-17 NOTE — Patient Instructions (Signed)

## 2015-03-17 NOTE — Progress Notes (Signed)
Subjective:    Patient ID: Katherine Ryan, female    DOB: 1956-07-28, 59 y.o.   MRN: [id number]  HPI Follow up for chronic medical problems. Patient still c/o dizziness- mainly when moving fast or exercise.  Hyperlipidemia This is a chronic problem. The current episode started more than 1 year ago. The problem is controlled. Recent lipid tests were reviewed and are normal. Exacerbating diseases include diabetes and obesity. She has no history of hypothyroidism. Pertinent negatives include no myalgias. Current antihyperlipidemic treatment includes statins. The current treatment provides moderate improvement of lipids. Compliance problems include adherence to diet and adherence to exercise.   Diabetes She presents for her follow-up diabetic visit. She has type 2 diabetes mellitus. No MedicAlert identification noted. Her disease course has been stable. Pertinent negatives for diabetes include no fatigue, no polydipsia, no polyphagia, no polyuria and no weakness. There are no hypoglycemic complications. Symptoms are stable. There are no diabetic complications. Risk factors for coronary artery disease include diabetes mellitus, dyslipidemia, obesity and post-menopausal. Current diabetic treatment includes oral agent (monotherapy). She is compliant with treatment most of the time. Her weight is stable. She is following a diabetic diet. She has not had a previous visit with a dietitian. She participates in exercise daily. Her breakfast blood glucose range is generally 90-110 mg/dl. Her overall blood glucose range is 90-110 mg/dl. An ACE inhibitor/angiotensin II receptor blocker is not being taken. She does not see a podiatrist.Eye exam is current.  GERD Nexium works well- no symptoms when takes meds  Review of Systems  Constitutional: Negative for fatigue.  HENT: Negative.   Eyes: Negative.   Respiratory: Negative.   Cardiovascular: Negative.   Gastrointestinal: Negative.   Endocrine: Negative for  polydipsia, polyphagia and polyuria.  Genitourinary: Negative.   Musculoskeletal: Positive for arthralgias. Negative for myalgias.       Left knee  Neurological: Negative.  Negative for weakness.  Psychiatric/Behavioral: Negative.        Objective:   Physical Exam  Constitutional: She is oriented to person, place, and time. She appears well-developed and well-nourished.  HENT:  Nose: Nose normal.  Mouth/Throat: Oropharynx is clear and moist.  Eyes: EOM are normal.  Neck: Trachea normal, normal range of motion and full passive range of motion without pain. Neck supple. No JVD present. Carotid bruit is not present. No thyromegaly present.  Cardiovascular: Normal rate, regular rhythm, normal heart sounds and intact distal pulses.  Exam reveals no gallop and no friction rub.   No murmur heard. Pulmonary/Chest: Effort normal and breath sounds normal.  Abdominal: Soft. Bowel sounds are normal. She exhibits no distension and no mass. There is no tenderness.  Musculoskeletal: Normal range of motion.  Lymphadenopathy:    She has no cervical adenopathy.  Neurological: She is alert and oriented to person, place, and time. She has normal reflexes.  Skin: Skin is warm and dry.  Psychiatric: She has a normal mood and affect. Her behavior is normal. Judgment and thought content normal.    BP 139/83 mmHg  Pulse 67  Temp(Src) 97.4 F (36.3 C) (Oral)  Ht '5\' 2"'  (1.575 m)  Wt 181 lb (82.101 kg)  BMI 33.10 kg/m2  LMP 04/04/2005  Results for orders placed or performed in visit on 03/17/15  POCT glycosylated hemoglobin (Hb A1C)  Result Value Ref Range   Hemoglobin A1C 6.3     Chest x ray- normal-Preliminary reading by Ronnald Collum, FNP  [facility] Adella Nissen, FNP  Assessment & Plan:   1. Hyperlipidemia with target LDL less than 100 Low fta diet - CMP14+EGFR - NMR, lipoprofile - DG Chest 2 View; Future - rosuvastatin (CRESTOR) 10 MG tablet; Take 1 tablet (10 mg  total) by mouth daily.  Dispense: 30 tablet; Refill: 5  2. Type 2 diabetes mellitus without complication Continue to watch carbs in diet - POCT glycosylated hemoglobin (Hb A1C) - EKG 12-Lead - metFORMIN (GLUCOPHAGE) 500 MG tablet; TAKE ONE TABLET BY MOUTH ONCE DAILY WITH  BREAKFAST  Dispense: 30 tablet; Refill: 5  3. Gastroesophageal reflux disease without esophagitis Avoid spicy foods Do not eat 3 hours prio rto bedtime - esomeprazole (NEXIUM) 40 MG capsule; Take 1 capsule (40 mg total) by mouth daily as needed.  Dispense: 30 capsule; Refill: 5  4. Vitamin D deficiency  5. Urinary urgency - fesoterodine (TOVIAZ) 8 MG TB24 tablet; Take 1 tablet (8 mg total) by mouth daily.  Dispense: 30 tablet; Refill: 5  6. Vertigo Rise slowly from sitting Force fluids - Ambulatory referral to Neurology    Labs pending Health maintenance reviewed Diet and exercise encouraged Continue all meds Follow up  In 3 months   [place], FNP

## 2015-03-21 LAB — CMP14+EGFR
ALT: 23 IU/L (ref 0–32)
AST: 20 IU/L (ref 0–40)
Albumin/Globulin Ratio: 1.6 (ref 1.1–2.5)
Albumin: 4.5 g/dL (ref 3.5–5.5)
Alkaline Phosphatase: 50 IU/L (ref 39–117)
BILIRUBIN TOTAL: 0.3 mg/dL (ref 0.0–1.2)
BUN/Creatinine Ratio: 24 — ABNORMAL HIGH (ref 9–23)
BUN: 16 mg/dL (ref 6–24)
CO2: 26 mmol/L (ref 18–29)
Calcium: 9.6 mg/dL (ref 8.7–10.2)
Chloride: 96 mmol/L — ABNORMAL LOW (ref 97–108)
Creatinine, Ser: 0.67 mg/dL (ref 0.57–1.00)
GFR calc Af Amer: 112 mL/min/{1.73_m2} (ref >59)
GFR, EST NON AFRICAN AMERICAN: 97 mL/min/{1.73_m2} (ref >59)
GLOBULIN, TOTAL: 2.8 g/dL (ref 1.5–4.5)
Glucose: 109 mg/dL — ABNORMAL HIGH (ref 65–99)
POTASSIUM: 4.7 mmol/L (ref 3.5–5.2)
SODIUM: 136 mmol/L (ref 134–144)
Total Protein: 7.3 g/dL (ref 6.0–8.5)

## 2015-03-21 LAB — NMR, LIPOPROFILE
CHOLESTEROL: 163 mg/dL (ref 100–199)
HDL Cholesterol by NMR: 63 mg/dL (ref >39)
HDL PARTICLE NUMBER: 42.3 umol/L (ref >=30.5)
LDL PARTICLE NUMBER: 1050 nmol/L — AB (ref <1000)
LDL SIZE: 20.8 nm (ref >20.5)
LDL-C: 75 mg/dL (ref 0–99)
LP-IR Score: 44 (ref <=45)
SMALL LDL PARTICLE NUMBER: 359 nmol/L (ref <=527)
TRIGLYCERIDES BY NMR: 126 mg/dL (ref 0–149)

## 2015-04-25 ENCOUNTER — Encounter: Payer: Self-pay | Admitting: Neurology

## 2015-04-25 ENCOUNTER — Ambulatory Visit (INDEPENDENT_AMBULATORY_CARE_PROVIDER_SITE_OTHER): Payer: BLUE CROSS/BLUE SHIELD | Admitting: Neurology

## 2015-04-25 VITALS — BP 130/92 | HR 80 | Resp 16 | Ht 62.75 in | Wt 182.0 lb

## 2015-04-25 DIAGNOSIS — R42 Dizziness and giddiness: Secondary | ICD-10-CM

## 2015-04-25 NOTE — Patient Instructions (Signed)
1. Schedule MRI brain with and without contrast with cuts through IACs 2. If MRI is normal, we will schedule you for vestibular therapy 3. Follow-up in 3 months
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
## 2015-05-02 ENCOUNTER — Encounter: Payer: Self-pay | Admitting: Neurology

## 2015-05-02 DIAGNOSIS — R42 Dizziness and giddiness: Secondary | ICD-10-CM | POA: Insufficient documentation

## 2015-05-12 ENCOUNTER — Ambulatory Visit (HOSPITAL_COMMUNITY): Admission: RE | Admit: 2015-05-12 | Payer: BLUE CROSS/BLUE SHIELD | Source: Ambulatory Visit

## 2015-05-14 ENCOUNTER — Ambulatory Visit (HOSPITAL_COMMUNITY)
Admission: RE | Admit: 2015-05-14 | Discharge: 2015-05-14 | Disposition: A | Discharge status: Home / Self Care | Payer: BLUE CROSS/BLUE SHIELD | Source: Ambulatory Visit | Attending: Neurology | Admitting: Neurology

## 2015-05-14 DIAGNOSIS — Q283 Other malformations of cerebral vessels: Secondary | ICD-10-CM | POA: Diagnosis not present

## 2015-05-14 DIAGNOSIS — R42 Dizziness and giddiness: Secondary | ICD-10-CM | POA: Insufficient documentation

## 2015-05-14 LAB — POCT I-STAT CREATININE: CREATININE: 0.8 mg/dL (ref 0.44–1.00)

## 2015-05-14 MED ORDER — GADOBENATE DIMEGLUMINE 529 MG/ML IV SOLN
16.0000 mL | Freq: Once | INTRAVENOUS | Status: AC | PRN
  Administered 2015-05-14: 16 mL via INTRAVENOUS

## 2015-05-15 ENCOUNTER — Telehealth: Payer: Self-pay | Admitting: Family Medicine

## 2015-05-15 NOTE — Telephone Encounter (Signed)
Lmovm to return my call. 

## 2015-05-15 NOTE — Telephone Encounter (Signed)
-----   Message from [place], DO sent at 05/15/2015  9:19 AM EDT ----- You can let pt know that MRI brain looked okay

## 2015-05-15 NOTE — Telephone Encounter (Signed)
Patient returned my call. Notified her of results.

## 2015-05-29 ENCOUNTER — Other Ambulatory Visit: Payer: Self-pay | Admitting: Nurse Practitioner

## 2015-06-03 ENCOUNTER — Telehealth: Payer: Self-pay | Admitting: Family Medicine

## 2015-06-03 NOTE — Telephone Encounter (Signed)
I spoke with patient about therapy. She lives in [place] & isn't able to come to [facility] for [facility] Therapy. [facility] in [place] doesn't offer Vestibular therapy. [facility] in [place] offers Vestibular Therapy & patient is willing to go there for a session to be taught program she can do at home, since she wouldn't be able to go to their location for regular visits either. She wants to do this in August. I will fax [facility] an order at the end of July as their referrals are only good for 30 days. They will call patient for an appt.   [facility]  [address]  Ph: [phone]  Fax: [phone]

## 2015-06-03 NOTE — Telephone Encounter (Signed)
-----   Message from Cameron Sprang, MD sent at 06/02/2015 12:10 PM EDT ----- Regarding: vestibular rehab [place] [facility], her brain MRI was normal, we had discussed vestibular therapy for the vertigo if MRI normal. Can you pls f/u if she is still having symptoms and this is something she would like to do?  Thanks, KA

## 2015-06-25 ENCOUNTER — Encounter: Payer: Self-pay | Admitting: Nurse Practitioner

## 2015-06-25 ENCOUNTER — Ambulatory Visit (INDEPENDENT_AMBULATORY_CARE_PROVIDER_SITE_OTHER): Payer: BLUE CROSS/BLUE SHIELD | Admitting: Nurse Practitioner

## 2015-06-25 VITALS — BP 136/82 | HR 79 | Temp 100.2°F | Ht 62.75 in | Wt 182.2 lb

## 2015-06-25 DIAGNOSIS — E559 Vitamin D deficiency, unspecified: Secondary | ICD-10-CM | POA: Diagnosis not present

## 2015-06-25 DIAGNOSIS — E785 Hyperlipidemia, unspecified: Secondary | ICD-10-CM

## 2015-06-25 DIAGNOSIS — E119 Type 2 diabetes mellitus without complications: Secondary | ICD-10-CM | POA: Diagnosis not present

## 2015-06-25 DIAGNOSIS — N3000 Acute cystitis without hematuria: Secondary | ICD-10-CM | POA: Diagnosis not present

## 2015-06-25 DIAGNOSIS — R300 Dysuria: Secondary | ICD-10-CM | POA: Diagnosis not present

## 2015-06-25 DIAGNOSIS — K219 Gastro-esophageal reflux disease without esophagitis: Secondary | ICD-10-CM

## 2015-06-25 DIAGNOSIS — R42 Dizziness and giddiness: Secondary | ICD-10-CM

## 2015-06-25 LAB — POCT UA - MICROSCOPIC ONLY
Casts, Ur, LPF, POC: NEGATIVE
Crystals, Ur, HPF, POC: NEGATIVE
MUCUS UA: NEGATIVE
Yeast, UA: NEGATIVE

## 2015-06-25 LAB — POCT URINALYSIS DIPSTICK
Bilirubin, UA: NEGATIVE
Blood, UA: NEGATIVE
GLUCOSE UA: NEGATIVE
Ketones, UA: NEGATIVE
Nitrite, UA: NEGATIVE
Spec Grav, UA: 1.01
UROBILINOGEN UA: NEGATIVE
pH, UA: 8

## 2015-06-25 LAB — POCT GLYCOSYLATED HEMOGLOBIN (HGB A1C): Hemoglobin A1C: 6.1

## 2015-06-25 MED ORDER — CIPROFLOXACIN HCL 500 MG PO TABS: 500.0000 mg | ORAL_TABLET | Freq: Two times a day (BID) | ORAL | Status: DC

## 2015-06-25 NOTE — Progress Notes (Signed)
Subjective:    Patient ID: Katherine Ryan, female    DOB: [birth year], 59 y.o.   MRN: [id number]  HPI Follow up for chronic medical problems. Her only compliant today is dysuria with chills that started 2 days ago.  Hyperlipidemia This is a chronic problem. The current episode started more than 1 year ago. The problem is controlled. Recent lipid tests were reviewed and are normal. Exacerbating diseases include diabetes and obesity. She has no history of hypothyroidism. Pertinent negatives include no myalgias. Current antihyperlipidemic treatment includes statins. The current treatment provides moderate improvement of lipids. Compliance problems include adherence to diet and adherence to exercise.   Diabetes She presents for her follow-up diabetic visit. She has type 2 diabetes mellitus. No MedicAlert identification noted. Her disease course has been stable. Pertinent negatives for diabetes include no fatigue, no polydipsia, no polyphagia, no polyuria and no weakness. There are no hypoglycemic complications. Symptoms are stable. There are no diabetic complications. Risk factors for coronary artery disease include diabetes mellitus, dyslipidemia, obesity and post-menopausal. Current diabetic treatment includes oral agent (monotherapy). She is compliant with treatment most of the time. Her weight is stable. She is following a diabetic diet. She has not had a previous visit with a dietitian. She participates in exercise daily. Her breakfast blood glucose range is generally 90-110 mg/dl. Her overall blood glucose range is 90-110 mg/dl. An ACE inhibitor/angiotensin II receptor blocker is not being taken. She does not see a podiatrist.Eye exam is current.  GERD Nexium works well- no symptoms when takes meds  Review of Systems  Constitutional: Negative for fatigue.  HENT: Negative.   Eyes: Negative.   Respiratory: Negative.   Cardiovascular: Negative.   Gastrointestinal: Negative.   Endocrine: Negative  for polydipsia, polyphagia and polyuria.  Genitourinary: Negative.   Musculoskeletal: Positive for arthralgias. Negative for myalgias.       Left knee  Neurological: Negative.  Negative for weakness.  Psychiatric/Behavioral: Negative.        Objective:   Physical Exam  Constitutional: She is oriented to person, place, and time. She appears well-developed and well-nourished.  HENT:  Nose: Nose normal.  Mouth/Throat: Oropharynx is clear and moist.  Eyes: EOM are normal.  Neck: Trachea normal, normal range of motion and full passive range of motion without pain. Neck supple. No JVD present. Carotid bruit is not present. No thyromegaly present.  Cardiovascular: Normal rate, regular rhythm, normal heart sounds and intact distal pulses.  Exam reveals no gallop and no friction rub.   No murmur heard. Pulmonary/Chest: Effort normal and breath sounds normal.  Abdominal: Soft. Bowel sounds are normal. She exhibits no distension and no mass. There is tenderness (mild suprapubic pain on palpation).  Genitourinary:  No CVA tenderness  Musculoskeletal: Normal range of motion.  Lymphadenopathy:    She has no cervical adenopathy.  Neurological: She is alert and oriented to person, place, and time. She has normal reflexes.  Skin: Skin is warm and dry.  Psychiatric: She has a normal mood and affect. Her behavior is normal. Judgment and thought content normal.    BP 136/82 mmHg  Pulse 79  Temp(Src) 100.2 F (37.9 C) (Oral)  Ht 5' 2.75" (1.594 m)  Wt 182 lb 3.2 oz (82.645 kg)  BMI 32.53 kg/m2  LMP 04/04/2005  Results for orders placed or performed in visit on 06/25/15  POCT glycosylated hemoglobin (Hb A1C)  Result Value Ref Range   Hemoglobin A1C 6.1   POCT urinalysis dipstick  Result Value Ref Range  Color, UA gold    Clarity, UA clear    Glucose, UA neg    Bilirubin, UA neg    Ketones, UA neg    Spec Grav, UA 1.010    Blood, UA neg    pH, UA 8.0    Protein, UA 300+++     Urobilinogen, UA negative    Nitrite, UA neg    Leukocytes, UA large (3+) (A) Negative  POCT UA - Microscopic Only  Result Value Ref Range   WBC, Ur, HPF, POC 20-40    RBC, urine, microscopic 1-3    Bacteria, U Microscopic ocx    Mucus, UA neg    Epithelial cells, urine per micros occ    Crystals, Ur, HPF, POC neg    Casts, Ur, LPF, POC neg    Yeast, UA neg           Assessment & Plan:  1. Type 2 diabetes mellitus without complication Continue to watch carbs in diet - POCT glycosylated hemoglobin (Hb A1C) - CMP14+EGFR  2. Hyperlipidemia with target LDL less than 100 low fat diet - Lipid panel  3. Dysuria - POCT urinalysis dipstick - POCT UA - Microscopic Only  4. Gastroesophageal reflux disease without esophagitis Avoid spicy foods Do not eat 2 hours prior to bedtime   5. Vitamin D deficiency  6. Vertigo Continue follow up with neurologist  7. Acute cystitis without hematuria Take medication as prescribe Cotton underwear Take shower not bath Cranberry juice, yogurt Force fluids AZO over the counter X2 days Culture pending RTO prn - ciprofloxacin (CIPRO) 500 MG tablet; Take 1 tablet (500 mg total) by mouth 2 (two) times daily.  Dispense: 14 tablet; Refill: 0    Labs pending Health maintenance reviewed Diet and exercise encouraged Continue all meds Follow up  In 3 months   [place], FNP

## 2015-06-25 NOTE — Patient Instructions (Signed)
Take medication as prescribe Cotton underwear Take shower not bath Cranberry juice, yogurt Force fluids AZO over the counter X2 days Culture pending RTO prn  

## 2015-06-26 LAB — CMP14+EGFR
ALT: 20 IU/L (ref 0–32)
AST: 19 IU/L (ref 0–40)
Albumin/Globulin Ratio: 1.5 (ref 1.1–2.5)
Albumin: 4.3 g/dL (ref 3.5–5.5)
Alkaline Phosphatase: 57 IU/L (ref 39–117)
BILIRUBIN TOTAL: 0.3 mg/dL (ref 0.0–1.2)
BUN / CREAT RATIO: 17 (ref 9–23)
BUN: 12 mg/dL (ref 6–24)
CHLORIDE: 100 mmol/L (ref 97–108)
CO2: 22 mmol/L (ref 18–29)
CREATININE: 0.71 mg/dL (ref 0.57–1.00)
Calcium: 9 mg/dL (ref 8.7–10.2)
GFR calc Af Amer: 109 mL/min/{1.73_m2} (ref >59)
GFR calc non Af Amer: 94 mL/min/{1.73_m2} (ref >59)
GLOBULIN, TOTAL: 2.8 g/dL (ref 1.5–4.5)
Glucose: 138 mg/dL — ABNORMAL HIGH (ref 65–99)
POTASSIUM: 4.4 mmol/L (ref 3.5–5.2)
Sodium: 139 mmol/L (ref 134–144)
Total Protein: 7.1 g/dL (ref 6.0–8.5)

## 2015-06-26 LAB — LIPID PANEL
CHOLESTEROL TOTAL: 146 mg/dL (ref 100–199)
Chol/HDL Ratio: 2.2 ratio units (ref 0.0–4.4)
HDL: 65 mg/dL (ref >39)
LDL Calculated: 68 mg/dL (ref 0–99)
TRIGLYCERIDES: 64 mg/dL (ref 0–149)
VLDL Cholesterol Cal: 13 mg/dL (ref 5–40)

## 2015-07-17 ENCOUNTER — Encounter: Payer: Self-pay | Admitting: *Deleted

## 2015-07-28 ENCOUNTER — Ambulatory Visit (INDEPENDENT_AMBULATORY_CARE_PROVIDER_SITE_OTHER): Payer: BLUE CROSS/BLUE SHIELD | Admitting: Neurology

## 2015-07-28 ENCOUNTER — Encounter: Payer: Self-pay | Admitting: Neurology

## 2015-07-28 VITALS — BP 110/70 | HR 70 | Ht 62.0 in | Wt 181.0 lb

## 2015-07-28 DIAGNOSIS — H8113 Benign paroxysmal vertigo, bilateral: Secondary | ICD-10-CM

## 2015-07-28 DIAGNOSIS — IMO0002 Reserved for concepts with insufficient information to code with codable children: Secondary | ICD-10-CM | POA: Insufficient documentation

## 2015-07-28 NOTE — Patient Instructions (Signed)
1. Schedule Vestibular Therapy 2. Follow-up as needed, call for any changes
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
## 2015-08-19 ENCOUNTER — Telehealth: Payer: Self-pay | Admitting: *Deleted

## 2015-08-19 DIAGNOSIS — IMO0002 Reserved for concepts with insufficient information to code with codable children: Secondary | ICD-10-CM

## 2015-08-19 NOTE — Telephone Encounter (Signed)
Rx faxed to [facility].

## 2015-08-19 NOTE — Telephone Encounter (Signed)
Katherine Ryan with [facility] needs a order for patients PT for vestibular therapy for tomorrows visit Fax number [phone]

## 2015-10-14 ENCOUNTER — Ambulatory Visit: Payer: BLUE CROSS/BLUE SHIELD | Admitting: Nurse Practitioner

## 2015-10-15 ENCOUNTER — Ambulatory Visit: Payer: BLUE CROSS/BLUE SHIELD | Admitting: Nurse Practitioner

## 2015-10-16 ENCOUNTER — Encounter: Payer: Self-pay | Admitting: Nurse Practitioner

## 2015-10-16 ENCOUNTER — Ambulatory Visit (INDEPENDENT_AMBULATORY_CARE_PROVIDER_SITE_OTHER): Payer: BLUE CROSS/BLUE SHIELD | Admitting: Nurse Practitioner

## 2015-10-16 VITALS — BP 136/84 | HR 69 | Temp 97.5°F | Ht 62.0 in | Wt 185.0 lb

## 2015-10-16 DIAGNOSIS — E119 Type 2 diabetes mellitus without complications: Secondary | ICD-10-CM

## 2015-10-16 DIAGNOSIS — E559 Vitamin D deficiency, unspecified: Secondary | ICD-10-CM

## 2015-10-16 DIAGNOSIS — K219 Gastro-esophageal reflux disease without esophagitis: Secondary | ICD-10-CM

## 2015-10-16 DIAGNOSIS — E785 Hyperlipidemia, unspecified: Secondary | ICD-10-CM | POA: Diagnosis not present

## 2015-10-16 DIAGNOSIS — Z6833 Body mass index (BMI) 33.0-33.9, adult: Secondary | ICD-10-CM

## 2015-10-16 LAB — POCT UA - MICROALBUMIN: Microalbumin Ur, POC: NEGATIVE mg/L

## 2015-10-16 LAB — POCT GLYCOSYLATED HEMOGLOBIN (HGB A1C): Hemoglobin A1C: 6.3

## 2015-10-16 MED ORDER — METFORMIN HCL 500 MG PO TABS: ORAL_TABLET | ORAL | Status: DC

## 2015-10-16 MED ORDER — ESOMEPRAZOLE MAGNESIUM 40 MG PO CPDR
40.0000 mg | DELAYED_RELEASE_CAPSULE | Freq: Every day | ORAL | Status: DC | PRN
Start: 2015-10-16 — End: 2016-02-10

## 2015-10-16 MED ORDER — ROSUVASTATIN CALCIUM 10 MG PO TABS: 10.0000 mg | ORAL_TABLET | Freq: Every day | ORAL | Status: DC

## 2015-10-16 NOTE — Patient Instructions (Signed)
Exercising to Stay Healthy Exercising regularly is important. It has many health benefits, such as:  Improving your overall fitness, flexibility, and endurance.  Increasing your bone density.  Helping with weight control.  Decreasing your body fat.  Increasing your muscle strength.  Reducing stress and tension.  Improving your overall health. In order to become healthy and stay healthy, it is recommended that you do moderate-intensity and vigorous-intensity exercise. You can tell that you are exercising at a moderate intensity if you have a higher heart rate and faster breathing, but you are still able to hold a conversation. You can tell that you are exercising at a vigorous intensity if you are breathing much harder and faster and cannot hold a conversation while exercising. HOW OFTEN SHOULD I EXERCISE? Choose an activity that you enjoy and set realistic goals. Your health care provider can help you to make an activity plan that works for you. Exercise regularly as directed by your health care provider. This may include:   Doing resistance training twice each week, such as:  Push-ups.  Sit-ups.  Lifting weights.  Using resistance bands.  Doing a given intensity of exercise for a given amount of time. Choose from these options:  150 minutes of moderate-intensity exercise every week.  75 minutes of vigorous-intensity exercise every week.  A mix of moderate-intensity and vigorous-intensity exercise every week. Children, pregnant women, people who are out of shape, people who are overweight, and older adults may need to consult a health care provider for individual recommendations. If you have any sort of medical condition, be sure to consult your health care provider before starting a new exercise program.  WHAT ARE SOME EXERCISE IDEAS? Some moderate-intensity exercise ideas include:   Walking at a rate of 1 mile in 15  minutes.  Biking.  Hiking.  Golfing.  Dancing. Some vigorous-intensity exercise ideas include:   Walking at a rate of at least 4.5 miles per hour.  Jogging or running at a rate of 5 miles per hour.  Biking at a rate of at least 10 miles per hour.  Lap swimming.  Roller-skating or in-line skating.  Cross-country skiing.  Vigorous competitive sports, such as football, basketball, and soccer.  Jumping rope.  Aerobic dancing. WHAT ARE SOME EVERYDAY ACTIVITIES THAT CAN HELP ME TO GET EXERCISE?  Yard work, such as:  Pushing a lawn mower.  Raking and bagging leaves.  Washing and waxing your car.  Pushing a stroller.  Shoveling snow.  Gardening.  Washing windows or floors. HOW CAN I BE MORE ACTIVE IN MY DAY-TO-DAY ACTIVITIES?  Use the stairs instead of the elevator.  Take a walk during your lunch break.  If you drive, park your car farther away from work or school.  If you take public transportation, get off one stop early and walk the rest of the way.  Make all of your phone calls while standing up and walking around.  Get up, stretch, and walk around every 30 minutes throughout the day. WHAT GUIDELINES SHOULD I FOLLOW WHILE EXERCISING?  Do not exercise so much that you hurt yourself, feel dizzy, or get very short of breath.  Consult your health care provider before starting a new exercise program.  Wear comfortable clothes and shoes with good support.  Drink plenty of water while you exercise to prevent dehydration or heat stroke. Body water is lost during exercise and must be replaced.  Work out until you breathe faster and your heart beats faster.   This information   is not intended to replace advice given to you by your health care provider. Make sure you discuss any questions you have with your health care provider.   Document Released: 01/15/2011 Document Revised: 01/03/2015 Document Reviewed: 05/16/2014 Elsevier Interactive Patient Education  2016 [employer]  

## 2015-10-16 NOTE — Progress Notes (Signed)
Subjective:    Patient ID: Katherine Ryan, female    DOB: 23-Oct-1956, 59 y.o.   MRN: [id number]  HPI Follow up for chronic medical problems. Her only compliant today is dysuria with chills that started 2 days ago.  Hyperlipidemia This is a chronic problem. The current episode started more than 1 year ago. The problem is controlled. Recent lipid tests were reviewed and are normal. Exacerbating diseases include diabetes and obesity. She has no history of hypothyroidism. Pertinent negatives include no myalgias. Current antihyperlipidemic treatment includes statins. The current treatment provides moderate improvement of lipids. Compliance problems include adherence to diet and adherence to exercise.   Diabetes She presents for her follow-up diabetic visit. She has type 2 diabetes mellitus. No MedicAlert identification noted. Her disease course has been stable. Pertinent negatives for diabetes include no fatigue, no polydipsia, no polyphagia, no polyuria and no weakness. There are no hypoglycemic complications. Symptoms are stable. There are no diabetic complications. Risk factors for coronary artery disease include diabetes mellitus, dyslipidemia, obesity and post-menopausal. Current diabetic treatment includes oral agent (monotherapy). She is compliant with treatment most of the time. Her weight is stable. She is following a diabetic diet. She has not had a previous visit with a dietitian. She participates in exercise daily. Her breakfast blood glucose range is generally 90-110 mg/dl. Her overall blood glucose range is 90-110 mg/dl. An ACE inhibitor/angiotensin II receptor blocker is not being taken. She does not see a podiatrist.Eye exam is current.  GERD Nexium works well- no symptoms when takes meds  Review of Systems  Constitutional: Negative for fatigue.  HENT: Negative.   Eyes: Negative.   Respiratory: Negative.   Cardiovascular: Negative.   Gastrointestinal: Negative.   Endocrine: Negative  for polydipsia, polyphagia and polyuria.  Genitourinary: Negative.   Musculoskeletal: Positive for arthralgias. Negative for myalgias.       Left knee  Neurological: Negative.  Negative for weakness.  Psychiatric/Behavioral: Negative.        Objective:   Physical Exam  Constitutional: She is oriented to person, place, and time. She appears well-developed and well-nourished.  HENT:  Nose: Nose normal.  Mouth/Throat: Oropharynx is clear and moist.  Eyes: EOM are normal.  Neck: Trachea normal, normal range of motion and full passive range of motion without pain. Neck supple. No JVD present. Carotid bruit is not present. No thyromegaly present.  Cardiovascular: Normal rate, regular rhythm, normal heart sounds and intact distal pulses.  Exam reveals no gallop and no friction rub.   No murmur heard. Pulmonary/Chest: Effort normal and breath sounds normal.  Abdominal: Soft. Bowel sounds are normal. She exhibits no distension and no mass. There is tenderness (mild suprapubic pain on palpation).  Genitourinary:  No CVA tenderness  Musculoskeletal: Normal range of motion.  Lymphadenopathy:    She has no cervical adenopathy.  Neurological: She is alert and oriented to person, place, and time. She has normal reflexes.  Skin: Skin is warm and dry.  Psychiatric: She has a normal mood and affect. Her behavior is normal. Judgment and thought content normal.   BP 136/84 mmHg  Pulse 69  Temp(Src) 97.5 F (36.4 C) (Oral)  Ht _0  (1.575 m)  Wt 185 lb (83.915 kg)  BMI 33.83 kg/m2  LMP 04/04/2005    Results for orders placed or performed in visit on 10/16/15  POCT glycosylated hemoglobin (Hb A1C)  Result Value Ref Range   Hemoglobin A1C 6.3   POCT UA - Microalbumin  Result Value Ref Range  Microalbumin Ur, POC negative mg/L          Assessment & Plan:  1. Type 2 diabetes mellitus without complication, without long-term current use of insulin (HCC) Continue to watch carbs in  diet - POCT glycosylated hemoglobin (Hb A1C) - POCT UA - Microalbumin - metFORMIN (GLUCOPHAGE) 500 MG tablet; TAKE ONE TABLET BY MOUTH ONCE DAILY WITH  BREAKFAST  Dispense: 30 tablet; Refill: 5  2. Hyperlipidemia with target LDL less than 100 Low fat diet - CMP14+EGFR - Lipid panel - rosuvastatin (CRESTOR) 10 MG tablet; Take 1 tablet (10 mg total) by mouth daily.  Dispense: 30 tablet; Refill: 5  3. Gastroesophageal reflux disease without esophagitis Avoid spicy foods Do not eat 2 hours prior to bedtime - esomeprazole (NEXIUM) 40 MG capsule; Take 1 capsule (40 mg total) by mouth daily as needed.  Dispense: 30 capsule; Refill: 5  4. Vitamin D deficiency Continue otc vitamin d  5. BMI 33.0-33.9,adult Discussed diet and exercise for person with BMI >25 Will recheck weight in 3-6 months     Labs pending Health maintenance reviewed Diet and exercise encouraged Continue all meds Follow up  In 6 month   [place], FNP

## 2015-10-17 LAB — CMP14+EGFR
ALT: 33 IU/L — AB (ref 0–32)
AST: 24 IU/L (ref 0–40)
Albumin/Globulin Ratio: 1.5 (ref 1.1–2.5)
Albumin: 4.5 g/dL (ref 3.5–5.5)
Alkaline Phosphatase: 59 IU/L (ref 39–117)
BILIRUBIN TOTAL: 0.2 mg/dL (ref 0.0–1.2)
BUN/Creatinine Ratio: 21 (ref 9–23)
BUN: 15 mg/dL (ref 6–24)
CO2: 25 mmol/L (ref 18–29)
Calcium: 10 mg/dL (ref 8.7–10.2)
Chloride: 98 mmol/L (ref 97–106)
Creatinine, Ser: 0.7 mg/dL (ref 0.57–1.00)
GFR calc non Af Amer: 95 mL/min/{1.73_m2} (ref >59)
GFR, EST AFRICAN AMERICAN: 110 mL/min/{1.73_m2} (ref >59)
Globulin, Total: 3 g/dL (ref 1.5–4.5)
Glucose: 104 mg/dL — ABNORMAL HIGH (ref 65–99)
POTASSIUM: 4.7 mmol/L (ref 3.5–5.2)
Sodium: 138 mmol/L (ref 136–144)
Total Protein: 7.5 g/dL (ref 6.0–8.5)

## 2015-10-17 LAB — LIPID PANEL
Chol/HDL Ratio: 2.8 ratio units (ref 0.0–4.4)
Cholesterol, Total: 197 mg/dL (ref 100–199)
HDL: 71 mg/dL (ref >39)
LDL Calculated: 107 mg/dL — ABNORMAL HIGH (ref 0–99)
TRIGLYCERIDES: 97 mg/dL (ref 0–149)
VLDL CHOLESTEROL CAL: 19 mg/dL (ref 5–40)

## 2016-02-10 ENCOUNTER — Encounter: Payer: Self-pay | Admitting: Nurse Practitioner

## 2016-02-10 ENCOUNTER — Ambulatory Visit (INDEPENDENT_AMBULATORY_CARE_PROVIDER_SITE_OTHER): Payer: BLUE CROSS/BLUE SHIELD | Admitting: Nurse Practitioner

## 2016-02-10 VITALS — BP 134/69 | HR 64 | Temp 99.2°F | Ht 62.0 in | Wt 183.0 lb

## 2016-02-10 DIAGNOSIS — E119 Type 2 diabetes mellitus without complications: Secondary | ICD-10-CM

## 2016-02-10 DIAGNOSIS — E785 Hyperlipidemia, unspecified: Secondary | ICD-10-CM | POA: Diagnosis not present

## 2016-02-10 DIAGNOSIS — Z1159 Encounter for screening for other viral diseases: Secondary | ICD-10-CM

## 2016-02-10 DIAGNOSIS — Z1212 Encounter for screening for malignant neoplasm of rectum: Secondary | ICD-10-CM

## 2016-02-10 DIAGNOSIS — M8580 Other specified disorders of bone density and structure, unspecified site: Secondary | ICD-10-CM

## 2016-02-10 DIAGNOSIS — E559 Vitamin D deficiency, unspecified: Secondary | ICD-10-CM

## 2016-02-10 DIAGNOSIS — Z6833 Body mass index (BMI) 33.0-33.9, adult: Secondary | ICD-10-CM | POA: Diagnosis not present

## 2016-02-10 DIAGNOSIS — K219 Gastro-esophageal reflux disease without esophagitis: Secondary | ICD-10-CM

## 2016-02-10 LAB — POCT GLYCOSYLATED HEMOGLOBIN (HGB A1C): HEMOGLOBIN A1C: 6.5

## 2016-02-10 MED ORDER — ESOMEPRAZOLE MAGNESIUM 40 MG PO CPDR
40.0000 mg | DELAYED_RELEASE_CAPSULE | Freq: Every day | ORAL | Status: DC | PRN
Start: 2016-02-10 — End: 2016-05-18

## 2016-02-10 MED ORDER — METFORMIN HCL 500 MG PO TABS: ORAL_TABLET | ORAL | Status: DC

## 2016-02-10 MED ORDER — ROSUVASTATIN CALCIUM 10 MG PO TABS: 10.0000 mg | ORAL_TABLET | Freq: Every day | ORAL | Status: DC

## 2016-02-10 NOTE — Patient Instructions (Signed)
Health Maintenance, Female Adopting a healthy lifestyle and getting preventive care can go a long way to promote health and wellness. Talk with your health care provider about what schedule of regular examinations is right for you. This is a good chance for you to check in with your provider about disease prevention and staying healthy. In between checkups, there are plenty of things you can do on your own. Experts have done a lot of research about which lifestyle changes and preventive measures are most likely to keep you healthy. Ask your health care provider for more information. WEIGHT AND DIET  Eat a healthy diet  Be sure to include plenty of vegetables, fruits, low-fat dairy products, and lean protein.  Do not eat a lot of foods high in solid fats, added sugars, or salt.  Get regular exercise. This is one of the most important things you can do for your health.  Most adults should exercise for at least 150 minutes each week. The exercise should increase your heart rate and make you sweat (moderate-intensity exercise).  Most adults should also do strengthening exercises at least twice a week. This is in addition to the moderate-intensity exercise.  Maintain a healthy weight  Body mass index (BMI) is a measurement that can be used to identify possible weight problems. It estimates body fat based on height and weight. Your health care provider can help determine your BMI and help you achieve or maintain a healthy weight.  For females 20 years of age and older:   A BMI below 18.5 is considered underweight.  A BMI of 18.5 to 24.9 is normal.  A BMI of 25 to 29.9 is considered overweight.  A BMI of 30 and above is considered obese.  Watch levels of cholesterol and blood lipids  You should start having your blood tested for lipids and cholesterol at 60 years of age, then have this test every 5 years.  You may need to have your cholesterol levels checked more often if:  Your lipid  or cholesterol levels are high.  You are older than 60 years of age.  You are at high risk for heart disease.  CANCER SCREENING   Lung Cancer  Lung cancer screening is recommended for adults 55-80 years old who are at high risk for lung cancer because of a history of smoking.  A yearly low-dose CT scan of the lungs is recommended for people who:  Currently smoke.  Have quit within the past 15 years.  Have at least a 30-pack-year history of smoking. A pack year is smoking an average of one pack of cigarettes a day for 1 year.  Yearly screening should continue until it has been 15 years since you quit.  Yearly screening should stop if you develop a health problem that would prevent you from having lung cancer treatment.  Breast Cancer  Practice breast self-awareness. This means understanding how your breasts normally appear and feel.  It also means doing regular breast self-exams. Let your health care provider know about any changes, no matter how small.  If you are in your 20s or 30s, you should have a clinical breast exam (CBE) by a health care provider every 1-3 years as part of a regular health exam.  If you are 40 or older, have a CBE every year. Also consider having a breast X-ray (mammogram) every year.  If you have a family history of breast cancer, talk to your health care provider about genetic screening.  If you   are at high risk for breast cancer, talk to your health care provider about having an MRI and a mammogram every year.  Breast cancer gene (BRCA) assessment is recommended for women who have family members with BRCA-related cancers. BRCA-related cancers include:  Breast.  Ovarian.  Tubal.  Peritoneal cancers.  Results of the assessment will determine the need for genetic counseling and BRCA1 and BRCA2 testing. Cervical Cancer Your health care provider may recommend that you be screened regularly for cancer of the pelvic organs (ovaries, uterus, and  vagina). This screening involves a pelvic examination, including checking for microscopic changes to the surface of your cervix (Pap test). You may be encouraged to have this screening done every 3 years, beginning at age 21.  For women ages 30-65, health care providers may recommend pelvic exams and Pap testing every 3 years, or they may recommend the Pap and pelvic exam, combined with testing for human papilloma virus (HPV), every 5 years. Some types of HPV increase your risk of cervical cancer. Testing for HPV may also be done on women of any age with unclear Pap test results.  Other health care providers may not recommend any screening for nonpregnant women who are considered low risk for pelvic cancer and who do not have symptoms. Ask your health care provider if a screening pelvic exam is right for you.  If you have had past treatment for cervical cancer or a condition that could lead to cancer, you need Pap tests and screening for cancer for at least 20 years after your treatment. If Pap tests have been discontinued, your risk factors (such as having a new sexual partner) need to be reassessed to determine if screening should resume. Some women have medical problems that increase the chance of getting cervical cancer. In these cases, your health care provider may recommend more frequent screening and Pap tests. Colorectal Cancer  This type of cancer can be detected and often prevented.  Routine colorectal cancer screening usually begins at 60 years of age and continues through 60 years of age.  Your health care provider may recommend screening at an earlier age if you have risk factors for colon cancer.  Your health care provider may also recommend using home test kits to check for hidden blood in the stool.  A small camera at the end of a tube can be used to examine your colon directly (sigmoidoscopy or colonoscopy). This is done to check for the earliest forms of colorectal  cancer.  Routine screening usually begins at age 50.  Direct examination of the colon should be repeated every 5-10 years through 60 years of age. However, you may need to be screened more often if early forms of precancerous polyps or small growths are found. Skin Cancer  Check your skin from head to toe regularly.  Tell your health care provider about any new moles or changes in moles, especially if there is a change in a mole's shape or color.  Also tell your health care provider if you have a mole that is larger than the size of a pencil eraser.  Always use sunscreen. Apply sunscreen liberally and repeatedly throughout the day.  Protect yourself by wearing long sleeves, pants, a wide-brimmed hat, and sunglasses whenever you are outside. HEART DISEASE, DIABETES, AND HIGH BLOOD PRESSURE   High blood pressure causes heart disease and increases the risk of stroke. High blood pressure is more likely to develop in:  People who have blood pressure in the high end   of the normal range (130-139/85-89 mm Hg).  People who are overweight or obese.  People who are African American.  If you are 38-23 years of age, have your blood pressure checked every 3-5 years. If you are 61 years of age or older, have your blood pressure checked every year. You should have your blood pressure measured twice--once when you are at a hospital or clinic, and once when you are not at a hospital or clinic. Record the average of the two measurements. To check your blood pressure when you are not at a hospital or clinic, you can use:  An automated blood pressure machine at a pharmacy.  A home blood pressure monitor.  If you are between 45 years and 39 years old, ask your health care provider if you should take aspirin to prevent strokes.  Have regular diabetes screenings. This involves taking a blood sample to check your fasting blood sugar level.  If you are at a normal weight and have a low risk for diabetes,  have this test once every three years after 60 years of age.  If you are overweight and have a high risk for diabetes, consider being tested at a younger age or more often. PREVENTING INFECTION  Hepatitis B  If you have a higher risk for hepatitis B, you should be screened for this virus. You are considered at high risk for hepatitis B if:  You were born in a country where hepatitis B is common. Ask your health care provider which countries are considered high risk.  Your parents were born in a high-risk country, and you have not been immunized against hepatitis B (hepatitis B vaccine).  You have HIV or AIDS.  You use needles to inject street drugs.  You live with someone who has hepatitis B.  You have had sex with someone who has hepatitis B.  You get hemodialysis treatment.  You take certain medicines for conditions, including cancer, organ transplantation, and autoimmune conditions. Hepatitis C  Blood testing is recommended for:  Everyone born from [birth year] through 1965.  Anyone with known risk factors for hepatitis C. Sexually transmitted infections (STIs)  You should be screened for sexually transmitted infections (STIs) including gonorrhea and chlamydia if:  You are sexually active and are younger than 60 years of age.  You are older than 60 years of age and your health care provider tells you that you are at risk for this type of infection.  Your sexual activity has changed since you were last screened and you are at an increased risk for chlamydia or gonorrhea. Ask your health care provider if you are at risk.  If you do not have HIV, but are at risk, it may be recommended that you take a prescription medicine daily to prevent HIV infection. This is called pre-exposure prophylaxis (PrEP). You are considered at risk if:  You are sexually active and do not regularly use condoms or know the HIV status of your partner(s).  You take drugs by injection.  You are sexually  active with a partner who has HIV. Talk with your health care provider about whether you are at high risk of being infected with HIV. If you choose to begin PrEP, you should first be tested for HIV. You should then be tested every 3 months for as long as you are taking PrEP.  PREGNANCY   If you are premenopausal and you may become pregnant, ask your health care provider about preconception counseling.  If you may  become pregnant, take 400 to 800 micrograms (mcg) of folic acid every day.  If you want to prevent pregnancy, talk to your health care provider about birth control (contraception). OSTEOPOROSIS AND MENOPAUSE   Osteoporosis is a disease in which the bones lose minerals and strength with aging. This can result in serious bone fractures. Your risk for osteoporosis can be identified using a bone density scan.  If you are 87 years of age or older, or if you are at risk for osteoporosis and fractures, ask your health care provider if you should be screened.  Ask your health care provider whether you should take a calcium or vitamin D supplement to lower your risk for osteoporosis.  Menopause may have certain physical symptoms and risks.  Hormone replacement therapy may reduce some of these symptoms and risks. Talk to your health care provider about whether hormone replacement therapy is right for you.  HOME CARE INSTRUCTIONS   Schedule regular health, dental, and eye exams.  Stay current with your immunizations.   Do not use any tobacco products including cigarettes, chewing tobacco, or electronic cigarettes.  If you are pregnant, do not drink alcohol.  If you are breastfeeding, limit how much and how often you drink alcohol.  Limit alcohol intake to no more than 1 drink per day for nonpregnant women. One drink equals 12 ounces of beer, 5 ounces of wine, or 1 ounces of hard liquor.  Do not use street drugs.  Do not share needles.  Ask your health care provider for help if  you need support or information about quitting drugs.  Tell your health care provider if you often feel depressed.  Tell your health care provider if you have ever been abused or do not feel safe at home.   This information is not intended to replace advice given to you by your health care provider. Make sure you discuss any questions you have with your health care provider.   Document Released: 06/28/2011 Document Revised: 01/03/2015 Document Reviewed: 11/14/2013 Elsevier Interactive Patient Education [employer].

## 2016-02-10 NOTE — Progress Notes (Signed)
Subjective:    Patient ID: Katherine Ryan, female    DOB: 02-Jul-1956, 60 y.o.   MRN: [id number]  HPI Patient here today for follow up of chronic medical problems.  Outpatient Encounter Prescriptions as of 02/10/2016  Medication Sig  . Cholecalciferol (VITAMIN D) 2000 UNITS CAPS Take by mouth.    . esomeprazole (NEXIUM) 40 MG capsule Take 1 capsule (40 mg total) by mouth daily as needed.  Marland Kitchen ibuprofen (ADVIL,MOTRIN) 200 MG tablet Take 200 mg by mouth every 6 (six) hours as needed.  . metFORMIN (GLUCOPHAGE) 500 MG tablet TAKE ONE TABLET BY MOUTH ONCE DAILY WITH  BREAKFAST  . rosuvastatin (CRESTOR) 10 MG tablet Take 1 tablet (10 mg total) by mouth daily.   No facility-administered encounter medications on file as of 02/10/2016.     Hyperlipidemia This is a chronic problem. The current episode started more than 1 year ago. The problem is controlled. Recent lipid tests were reviewed and are normal. Exacerbating diseases include diabetes and obesity. She has no history of hypothyroidism. Pertinent negatives include no myalgias. Current antihyperlipidemic treatment includes statins. The current treatment provides moderate improvement of lipids. Compliance problems include adherence to diet and adherence to exercise.   Diabetes She presents for her follow-up diabetic visit. She has type 2 diabetes mellitus. No MedicAlert identification noted. Her disease course has been stable. Pertinent negatives for diabetes include no fatigue, no polydipsia, no polyphagia, no polyuria and no weakness. There are no hypoglycemic complications. Symptoms are stable. There are no diabetic complications. Risk factors for coronary artery disease include diabetes mellitus, dyslipidemia, obesity and post-menopausal. Current diabetic treatment includes oral agent (monotherapy). She is compliant with treatment most of the time. Her weight is stable. She is following a diabetic diet. She has not had a previous visit with a  dietitian. She participates in exercise daily. Her breakfast blood glucose range is generally 90-110 mg/dl. Her overall blood glucose range is 90-110 mg/dl. An ACE inhibitor/angiotensin II receptor blocker is not being taken. She does not see a podiatrist.Eye exam is current.  GERD Nexium works well- no symptoms when takes meds  Review of Systems  Constitutional: Negative for fatigue.  HENT: Negative.   Eyes: Negative.   Respiratory: Negative.   Cardiovascular: Negative.   Gastrointestinal: Negative.   Endocrine: Negative for polydipsia, polyphagia and polyuria.  Genitourinary: Negative.   Musculoskeletal: Positive for arthralgias. Negative for myalgias.       Left knee  Neurological: Negative.  Negative for weakness.  Psychiatric/Behavioral: Negative.        Objective:   Physical Exam  Constitutional: She is oriented to person, place, and time. She appears well-developed and well-nourished.  HENT:  Nose: Nose normal.  Mouth/Throat: Oropharynx is clear and moist.  Eyes: EOM are normal.  Neck: Trachea normal, normal range of motion and full passive range of motion without pain. Neck supple. No JVD present. Carotid bruit is not present. No thyromegaly present.  Cardiovascular: Normal rate, regular rhythm, normal heart sounds and intact distal pulses.  Exam reveals no gallop and no friction rub.   No murmur heard. Pulmonary/Chest: Effort normal and breath sounds normal.  Abdominal: Soft. Bowel sounds are normal. She exhibits no distension and no mass. There is tenderness (mild suprapubic pain on palpation).  Genitourinary:  No CVA tenderness  Musculoskeletal: Normal range of motion.  Lymphadenopathy:    She has no cervical adenopathy.  Neurological: She is alert and oriented to person, place, and time. She has normal reflexes.  Skin: Skin  is warm and dry.  Psychiatric: She has a normal mood and affect. Her behavior is normal. Judgment and thought content normal.   BP 134/69 mmHg   Pulse 64  Temp(Src) 99.2 F (37.3 C) (Oral)  Ht _0  (1.575 m)  Wt 183 lb (83.008 kg)  BMI 33.46 kg/m2  LMP 04/04/2005  Results for orders placed or performed in visit on 02/10/16  POCT glycosylated hemoglobin (Hb A1C)  Result Value Ref Range   Hemoglobin A1C 6.5        Assessment & Plan:  1. Type 2 diabetes mellitus without complication, without long-term current use of insulin (HCC) Continue to watch carbs in diet - POCT glycosylated hemoglobin (Hb A1C) - CMP14+EGFR - metFORMIN (GLUCOPHAGE) 500 MG tablet; TAKE ONE TABLET BY MOUTH ONCE DAILY WITH  BREAKFAST  Dispense: 30 tablet; Refill: 5  2. Hyperlipidemia with target LDL less than 100 Low fat diet - Lipid panel - rosuvastatin (CRESTOR) 10 MG tablet; Take 1 tablet (10 mg total) by mouth daily.  Dispense: 30 tablet; Refill: 5  3. Gastroesophageal reflux disease without esophagitis Avoid spicy foods Do not eat 2 hours prior to bedtime - esomeprazole (NEXIUM) 40 MG capsule; Take 1 capsule (40 mg total) by mouth daily as needed.  Dispense: 30 capsule; Refill: 5  4. Vitamin D deficiency  5. BMI 33.0-33.9,adult Discussed diet and exercise for person with BMI >25 Will recheck weight in 3-6 months  6. Osteopenia Weight bearing exercises  7. Screening for malignant neoplasm of the rectum - Fecal occult blood, imunochemical; Future  8. Need for hepatitis C screening test - Hepatitis C antibody   patietn will call and make appointment for mammogram Labs pending Health maintenance reviewed Diet and exercise encouraged Continue all meds Follow up  In 3 months   [place], FNP

## 2016-02-11 LAB — CMP14+EGFR
ALBUMIN: 4.5 g/dL (ref 3.5–5.5)
ALK PHOS: 56 IU/L (ref 39–117)
ALT: 24 IU/L (ref 0–32)
AST: 21 IU/L (ref 0–40)
Albumin/Globulin Ratio: 1.7 (ref 1.1–2.5)
BILIRUBIN TOTAL: 0.2 mg/dL (ref 0.0–1.2)
BUN/Creatinine Ratio: 19 (ref 9–23)
BUN: 14 mg/dL (ref 6–24)
CALCIUM: 9.7 mg/dL (ref 8.7–10.2)
CO2: 25 mmol/L (ref 18–29)
Chloride: 99 mmol/L (ref 96–106)
Creatinine, Ser: 0.75 mg/dL (ref 0.57–1.00)
GFR calc Af Amer: 101 mL/min/{1.73_m2} (ref >59)
GFR calc non Af Amer: 88 mL/min/{1.73_m2} (ref >59)
GLOBULIN, TOTAL: 2.7 g/dL (ref 1.5–4.5)
Glucose: 130 mg/dL — ABNORMAL HIGH (ref 65–99)
Potassium: 5 mmol/L (ref 3.5–5.2)
SODIUM: 139 mmol/L (ref 134–144)
Total Protein: 7.2 g/dL (ref 6.0–8.5)

## 2016-02-11 LAB — LIPID PANEL
CHOLESTEROL TOTAL: 180 mg/dL (ref 100–199)
Chol/HDL Ratio: 2.9 ratio units (ref 0.0–4.4)
HDL: 62 mg/dL (ref >39)
LDL Calculated: 101 mg/dL — ABNORMAL HIGH (ref 0–99)
Triglycerides: 84 mg/dL (ref 0–149)
VLDL CHOLESTEROL CAL: 17 mg/dL (ref 5–40)

## 2016-02-11 LAB — HEPATITIS C ANTIBODY: Hep C Virus Ab: <0.1 s/co ratio (ref 0.0–0.9)

## 2016-02-24 ENCOUNTER — Other Ambulatory Visit: Payer: BLUE CROSS/BLUE SHIELD

## 2016-02-24 DIAGNOSIS — Z1212 Encounter for screening for malignant neoplasm of rectum: Secondary | ICD-10-CM

## 2016-02-24 NOTE — Progress Notes (Signed)
Lab only 

## 2016-02-26 LAB — FECAL OCCULT BLOOD, IMMUNOCHEMICAL: Fecal Occult Bld: NEGATIVE

## 2016-04-20 ENCOUNTER — Encounter: Payer: Self-pay | Admitting: Family

## 2016-04-20 ENCOUNTER — Ambulatory Visit (INDEPENDENT_AMBULATORY_CARE_PROVIDER_SITE_OTHER): Payer: BLUE CROSS/BLUE SHIELD | Admitting: Family

## 2016-04-20 VITALS — BP 138/75 | HR 68 | Temp 97.0°F | Ht 62.0 in | Wt 185.8 lb

## 2016-04-20 DIAGNOSIS — M545 Low back pain, unspecified: Secondary | ICD-10-CM

## 2016-04-20 DIAGNOSIS — S39012A Strain of muscle, fascia and tendon of lower back, initial encounter: Secondary | ICD-10-CM

## 2016-04-20 MED ORDER — CYCLOBENZAPRINE HCL 5 MG PO TABS
5.0000 mg | ORAL_TABLET | Freq: Three times a day (TID) | ORAL | Status: DC | PRN
Start: 2016-04-20 — End: 2016-05-18

## 2016-04-20 MED ORDER — CELECOXIB 100 MG PO CAPS: 100.0000 mg | ORAL_CAPSULE | Freq: Two times a day (BID) | ORAL | Status: DC

## 2016-04-20 NOTE — Progress Notes (Signed)
   Subjective:    Patient ID: Katherine Ryan, female    DOB: April 21, 1956, 60 y.o.   MRN: [id number]  Back Pain This is a new problem. The current episode started yesterday. The problem occurs constantly. The problem has been waxing and waning since onset. The pain is present in the gluteal. The quality of the pain is described as aching. The pain does not radiate. The pain is at a severity of 8/10. The pain is moderate. The symptoms are aggravated by bending and standing. Pertinent negatives include no bladder incontinence, bowel incontinence, dysuria, headaches, leg pain, numbness, pelvic pain or weakness. She has tried bed rest and heat for the symptoms. The treatment provided mild relief.      Review of Systems  Constitutional: Negative.   HENT: Negative.   Eyes: Negative.   Respiratory: Negative.  Negative for shortness of breath.   Cardiovascular: Negative.  Negative for palpitations.  Gastrointestinal: Negative.  Negative for bowel incontinence.  Endocrine: Negative.   Genitourinary: Negative.  Negative for bladder incontinence, dysuria and pelvic pain.  Musculoskeletal: Positive for back pain.  Neurological: Negative.  Negative for weakness, numbness and headaches.  Hematological: Negative.   Psychiatric/Behavioral: Negative.   All other systems reviewed and are negative.      Objective:   Physical Exam  Constitutional: She is oriented to person, place, and time. She appears well-developed and well-nourished. No distress.  HENT:  Head: Normocephalic and atraumatic.  Eyes: Pupils are equal, round, and reactive to light.  Neck: Normal range of motion. Neck supple. No thyromegaly present.  Cardiovascular: Normal rate, regular rhythm, normal heart sounds and intact distal pulses.   No murmur heard. Pulmonary/Chest: Effort normal and breath sounds normal. No respiratory distress. She has no wheezes.  Abdominal: Soft. Bowel sounds are normal. She exhibits no distension. There is  no tenderness.  Musculoskeletal: Normal range of motion. She exhibits no edema or tenderness.  Neurological: She is alert and oriented to person, place, and time.  Skin: Skin is warm and dry.  Psychiatric: She has a normal mood and affect. Her behavior is normal. Judgment and thought content normal.  Vitals reviewed.   BP 138/75 mmHg  Pulse 68  Temp(Src) 97 F (36.1 C) (Oral)  Ht 5\' 2"  (1.575 m)  Wt 185 lb 12.8 oz (84.278 kg)  BMI 33.97 kg/m2  LMP 04/04/2005       Assessment & Plan:  1. Bilateral low back pain without sciatica -Rest -Ice and heat as needed -Sedation precautions discussed -Pt has taken Celebrex in past with no problems -ROM exercises and stretches discussed -RTO prn  - cyclobenzaprine (FLEXERIL) 5 MG tablet; Take 1 tablet (5 mg total) by mouth 3 (three) times daily as needed for muscle spasms.  Dispense: 30 tablet; Refill: 0 - celecoxib (CELEBREX) 100 MG capsule; Take 1 capsule (100 mg total) by mouth 2 (two) times daily.  Dispense: 60 capsule; Refill: 1  2. Low back strain, initial encounter Rest -Ice and heat as needed -Sedation precautions discussed -Pt has taken Celebrex in past with no problems -ROM exercises and stretches discussed -RTO prn   Evelina Dun, FNP

## 2016-04-20 NOTE — Patient Instructions (Signed)

## 2016-04-21 ENCOUNTER — Telehealth: Payer: Self-pay | Admitting: Family

## 2016-04-21 MED ORDER — NAPROXEN 500 MG PO TABS: 500.0000 mg | ORAL_TABLET | Freq: Two times a day (BID) | ORAL | Status: DC

## 2016-04-21 NOTE — Telephone Encounter (Signed)
Naprosyn Prescription sent to pharmacy. PT needs to take with food. PT needs to make sure she takes her nexium every day.

## 2016-04-21 NOTE — Telephone Encounter (Signed)
Patient called stating that pharmacy needs prior auth on celebrex and would like something else sent to her pharmacy for pain until she can get celebrex

## 2016-04-21 NOTE — Addendum Note (Signed)
Addended by: Evelina Dun A on: 04/21/2016 06:32 PM   Modules accepted: Orders

## 2016-04-22 NOTE — Telephone Encounter (Signed)
Left message.  Please call us to confirm .

## 2016-05-18 ENCOUNTER — Ambulatory Visit (INDEPENDENT_AMBULATORY_CARE_PROVIDER_SITE_OTHER): Payer: BLUE CROSS/BLUE SHIELD | Admitting: Nurse Practitioner

## 2016-05-18 ENCOUNTER — Encounter: Payer: Self-pay | Admitting: Nurse Practitioner

## 2016-05-18 VITALS — BP 143/73 | HR 61 | Temp 98.1°F | Ht 62.0 in | Wt 186.0 lb

## 2016-05-18 DIAGNOSIS — E785 Hyperlipidemia, unspecified: Secondary | ICD-10-CM | POA: Diagnosis not present

## 2016-05-18 DIAGNOSIS — M8580 Other specified disorders of bone density and structure, unspecified site: Secondary | ICD-10-CM

## 2016-05-18 DIAGNOSIS — H5440 Blindness, one eye, unspecified eye: Secondary | ICD-10-CM

## 2016-05-18 DIAGNOSIS — E559 Vitamin D deficiency, unspecified: Secondary | ICD-10-CM | POA: Diagnosis not present

## 2016-05-18 DIAGNOSIS — H5441 Blindness, right eye, normal vision left eye: Secondary | ICD-10-CM

## 2016-05-18 DIAGNOSIS — E119 Type 2 diabetes mellitus without complications: Secondary | ICD-10-CM

## 2016-05-18 DIAGNOSIS — M545 Low back pain, unspecified: Secondary | ICD-10-CM

## 2016-05-18 DIAGNOSIS — K219 Gastro-esophageal reflux disease without esophagitis: Secondary | ICD-10-CM

## 2016-05-18 DIAGNOSIS — Z6833 Body mass index (BMI) 33.0-33.9, adult: Secondary | ICD-10-CM | POA: Diagnosis not present

## 2016-05-18 LAB — CMP14+EGFR
A/G RATIO: 1.7 (ref 1.2–2.2)
ALK PHOS: 64 IU/L (ref 39–117)
ALT: 38 IU/L — ABNORMAL HIGH (ref 0–32)
AST: 25 IU/L (ref 0–40)
Albumin: 4.6 g/dL (ref 3.5–5.5)
BILIRUBIN TOTAL: 0.2 mg/dL (ref 0.0–1.2)
BUN/Creatinine Ratio: 17 (ref 9–23)
BUN: 12 mg/dL (ref 6–24)
CHLORIDE: 99 mmol/L (ref 96–106)
CO2: 26 mmol/L (ref 18–29)
Calcium: 9.9 mg/dL (ref 8.7–10.2)
Creatinine, Ser: 0.7 mg/dL (ref 0.57–1.00)
GFR calc Af Amer: 110 mL/min/{1.73_m2} (ref >59)
GFR calc non Af Amer: 95 mL/min/{1.73_m2} (ref >59)
GLOBULIN, TOTAL: 2.7 g/dL (ref 1.5–4.5)
Glucose: 116 mg/dL — ABNORMAL HIGH (ref 65–99)
Potassium: 4.7 mmol/L (ref 3.5–5.2)
SODIUM: 140 mmol/L (ref 134–144)
Total Protein: 7.3 g/dL (ref 6.0–8.5)

## 2016-05-18 LAB — LIPID PANEL
CHOL/HDL RATIO: 2.7 ratio (ref 0.0–4.4)
CHOLESTEROL TOTAL: 185 mg/dL (ref 100–199)
HDL: 69 mg/dL (ref >39)
LDL Calculated: 96 mg/dL (ref 0–99)
Triglycerides: 102 mg/dL (ref 0–149)
VLDL Cholesterol Cal: 20 mg/dL (ref 5–40)

## 2016-05-18 LAB — BAYER DCA HB A1C WAIVED: HB A1C: 6.5 % (ref <7.0)

## 2016-05-18 MED ORDER — ESOMEPRAZOLE MAGNESIUM 40 MG PO CPDR
40.0000 mg | DELAYED_RELEASE_CAPSULE | Freq: Every day | ORAL | Status: DC | PRN
Start: 2016-05-18 — End: 2016-09-07

## 2016-05-18 MED ORDER — METFORMIN HCL 500 MG PO TABS: ORAL_TABLET | ORAL | Status: DC

## 2016-05-18 MED ORDER — CYCLOBENZAPRINE HCL 5 MG PO TABS
5.0000 mg | ORAL_TABLET | Freq: Three times a day (TID) | ORAL | Status: DC | PRN
Start: 2016-05-18 — End: 2016-11-29

## 2016-05-18 MED ORDER — ROSUVASTATIN CALCIUM 10 MG PO TABS: 10.0000 mg | ORAL_TABLET | Freq: Every day | ORAL | Status: DC

## 2016-05-18 NOTE — Patient Instructions (Signed)
Diabetes and Foot Care Diabetes may cause you to have problems because of poor blood supply (circulation) to your feet and legs. This may cause the skin on your feet to become thinner, break easier, and heal more slowly. Your skin may become dry, and the skin may peel and crack. You may also have nerve damage in your legs and feet causing decreased feeling in them. You may not notice minor injuries to your feet that could lead to infections or more serious problems. Taking care of your feet is one of the most important things you can do for yourself.  HOME CARE INSTRUCTIONS  Wear shoes at all times, even in the house. Do not go barefoot. Bare feet are easily injured.  Check your feet daily for blisters, cuts, and redness. If you cannot see the bottom of your feet, use a mirror or ask someone for help.  Wash your feet with warm water (do not use hot water) and mild soap. Then pat your feet and the areas between your toes until they are completely dry. Do not soak your feet as this can dry your skin.  Apply a moisturizing lotion or petroleum jelly (that does not contain alcohol and is unscented) to the skin on your feet and to dry, brittle toenails. Do not apply lotion between your toes.  Trim your toenails straight across. Do not dig under them or around the cuticle. File the edges of your nails with an emery board or nail file.  Do not cut corns or calluses or try to remove them with medicine.  Wear clean socks or stockings every day. Make sure they are not too tight. Do not wear knee-high stockings since they may decrease blood flow to your legs.  Wear shoes that fit properly and have enough cushioning. To break in new shoes, wear them for just a few hours a day. This prevents you from injuring your feet. Always look in your shoes before you put them on to be sure there are no objects inside.  Do not cross your legs. This may decrease the blood flow to your feet.  If you find a minor scrape,  cut, or break in the skin on your feet, keep it and the skin around it clean and dry. These areas may be cleansed with mild soap and water. Do not cleanse the area with peroxide, alcohol, or iodine.  When you remove an adhesive bandage, be sure not to damage the skin around it.  If you have a wound, look at it several times a day to make sure it is healing.  Do not use heating pads or hot water bottles. They may burn your skin. If you have lost feeling in your feet or legs, you may not know it is happening until it is too late.  Make sure your health care provider performs a complete foot exam at least annually or more often if you have foot problems. Report any cuts, sores, or bruises to your health care provider immediately. SEEK MEDICAL CARE IF:   You have an injury that is not healing.  You have cuts or breaks in the skin.  You have an ingrown nail.  You notice redness on your legs or feet.  You feel burning or tingling in your legs or feet.  You have pain or cramps in your legs and feet.  Your legs or feet are numb.  Your feet always feel cold. SEEK IMMEDIATE MEDICAL CARE IF:   There is increasing redness,   swelling, or pain in or around a wound.  There is a red line that goes up your leg.  Pus is coming from a wound.  You develop a fever or as directed by your health care provider.  You notice a bad smell coming from an ulcer or wound.   This information is not intended to replace advice given to you by your health care provider. Make sure you discuss any questions you have with your health care provider.   Document Released: 12/10/2000 Document Revised: 08/15/2013 Document Reviewed: 05/22/2013 Elsevier Interactive Patient Education 2016 [employer]  

## 2016-05-18 NOTE — Progress Notes (Signed)
Subjective:    Patient ID: Katherine Ryan, female    DOB: [birth year], 60 y.o.   MRN: [id number]  HPI Patient here today for follow up of chronic medical problems.  Outpatient Encounter Prescriptions as of 05/18/2016  Medication Sig  . Cholecalciferol (VITAMIN D) 2000 UNITS CAPS Take by mouth.    . cyclobenzaprine (FLEXERIL) 5 MG tablet Take 1 tablet (5 mg total) by mouth 3 (three) times daily as needed for muscle spasms.  Marland Kitchen esomeprazole (NEXIUM) 40 MG capsule Take 1 capsule (40 mg total) by mouth daily as needed.  Marland Kitchen ibuprofen (ADVIL,MOTRIN) 200 MG tablet Take 200 mg by mouth every 6 (six) hours as needed.  . metFORMIN (GLUCOPHAGE) 500 MG tablet TAKE ONE TABLET BY MOUTH ONCE DAILY WITH  BREAKFAST  . naproxen (NAPROSYN) 500 MG tablet Take 1 tablet (500 mg total) by mouth 2 (two) times daily with a meal.  . rosuvastatin (CRESTOR) 10 MG tablet Take 1 tablet (10 mg total) by mouth daily.   No facility-administered encounter medications on file as of 05/18/2016.   * has some back pain several weeks ago- saw C. HawksFNP and was given flexeril 57m which has really helpd- wold like to have refill to keep on hand.   Hyperlipidemia This is a chronic problem. The current episode started more than 1 year ago. The problem is controlled. Recent lipid tests were reviewed and are normal. Exacerbating diseases include diabetes and obesity. She has no history of hypothyroidism. Pertinent negatives include no myalgias. Current antihyperlipidemic treatment includes statins. The current treatment provides moderate improvement of lipids. Compliance problems include adherence to diet and adherence to exercise.   Diabetes She presents for her follow-up diabetic visit. She has type 2 diabetes mellitus. No MedicAlert identification noted. Her disease course has been stable. Pertinent negatives for diabetes include no fatigue, no polydipsia, no polyphagia, no polyuria and no weakness. There are no hypoglycemic  complications. Symptoms are stable. There are no diabetic complications. Risk factors for coronary artery disease include diabetes mellitus, dyslipidemia, obesity and post-menopausal. Current diabetic treatment includes oral agent (monotherapy). She is compliant with treatment most of the time. Her weight is stable. She is following a diabetic diet. She has not had a previous visit with a dietitian. She participates in exercise daily. Her breakfast blood glucose range is generally 90-110 mg/dl. Her overall blood glucose range is 90-110 mg/dl. An ACE inhibitor/angiotensin II receptor blocker is not being taken. She does not see a podiatrist.Eye exam is current.  GERD Nexium works well- no symptoms when takes meds osteopenia  Tries to walk 2-3 x a week- no c/o back pain Blind in right eye Congential cataract Vitamin d def Currently not on anything   Review of Systems  Constitutional: Negative for fatigue.  HENT: Negative.   Eyes: Negative.   Respiratory: Negative.   Cardiovascular: Negative.   Gastrointestinal: Negative.   Endocrine: Negative for polydipsia, polyphagia and polyuria.  Genitourinary: Negative.   Musculoskeletal: Negative for myalgias.  Neurological: Negative.  Negative for weakness.  Psychiatric/Behavioral: Negative.        Objective:   Physical Exam  Constitutional: She is oriented to person, place, and time. She appears well-developed and well-nourished.  HENT:  Nose: Nose normal.  Mouth/Throat: Oropharynx is clear and moist.  Eyes: EOM are normal.  Neck: Trachea normal, normal range of motion and full passive range of motion without pain. Neck supple. No JVD present. Carotid bruit is not present. No thyromegaly present.  Cardiovascular: Normal rate, regular rhythm,  normal heart sounds and intact distal pulses.  Exam reveals no gallop and no friction rub.   No murmur heard. Pulmonary/Chest: Effort normal and breath sounds normal.  Abdominal: Soft. Bowel sounds are  normal. She exhibits no distension and no mass. There is tenderness (mild suprapubic pain on palpation).  Genitourinary:  No CVA tenderness  Musculoskeletal: Normal range of motion.  Lymphadenopathy:    She has no cervical adenopathy.  Neurological: She is alert and oriented to person, place, and time. She has normal reflexes.  Skin: Skin is warm and dry.  Psychiatric: She has a normal mood and affect. Her behavior is normal. Judgment and thought content normal.   BP 143/73 mmHg  Pulse 61  Temp(Src) 98.1 F (36.7 C) (Oral)  Ht '5\' 2"'  (1.575 m)  Wt 186 lb (84.369 kg)  BMI 34.01 kg/m2  LMP 04/04/2005  hgba1c 6.5%- unchanged from previous     Assessment & Plan:  1. Type 2 diabetes mellitus without complication, without long-term current use of insulin (HCC) Continue to watch carbs in diet - Bayer DCA Hb A1c Waived - metFORMIN (GLUCOPHAGE) 500 MG tablet; TAKE ONE TABLET BY MOUTH ONCE DAILY WITH  BREAKFAST  Dispense: 30 tablet; Refill: 5  2. Hyperlipidemia with target LDL less than 100 Low fat diet - Lipid panel - rosuvastatin (CRESTOR) 10 MG tablet; Take 1 tablet (10 mg total) by mouth daily.  Dispense: 30 tablet; Refill: 5 - CMP14+EGFR  3. Gastroesophageal reflux disease without esophagitis Avoid spicy foods Do not eat 2 hours prior to bedtime - esomeprazole (NEXIUM) 40 MG capsule; Take 1 capsule (40 mg total) by mouth daily as needed.  Dispense: 30 capsule; Refill: 5  4. Osteopenia Weight bearing exercises encouraged  5. Vitamin D deficiency  6. BMI 33.0-33.9,adult Discussed diet and exercise for person with BMI >25 Will recheck weight in 3-6 months  7. Blind right eye  9. Bilateral low back pain without sciatica Moist heat reat Avoid heavy lifting - cyclobenzaprine (FLEXERIL) 5 MG tablet; Take 1 tablet (5 mg total) by mouth 3 (three) times daily as needed for muscle spasms.  Dispense: 30 tablet; Refill: 2   Patient will schedule her own mammogram Labs  pending Health maintenance reviewed Diet and exercise encouraged Continue all meds Follow up  In 3 months   [place], FNP

## 2016-08-05 DIAGNOSIS — Z1231 Encounter for screening mammogram for malignant neoplasm of breast: Secondary | ICD-10-CM | POA: Diagnosis not present

## 2016-09-02 LAB — HM DIABETES EYE EXAM

## 2016-09-07 ENCOUNTER — Ambulatory Visit (INDEPENDENT_AMBULATORY_CARE_PROVIDER_SITE_OTHER): Payer: BLUE CROSS/BLUE SHIELD | Admitting: Nurse Practitioner

## 2016-09-07 ENCOUNTER — Encounter: Payer: Self-pay | Admitting: Nurse Practitioner

## 2016-09-07 VITALS — BP 144/88 | HR 68 | Temp 97.4°F | Ht 62.0 in | Wt 186.0 lb

## 2016-09-07 DIAGNOSIS — I10 Essential (primary) hypertension: Secondary | ICD-10-CM | POA: Diagnosis not present

## 2016-09-07 DIAGNOSIS — E119 Type 2 diabetes mellitus without complications: Secondary | ICD-10-CM | POA: Diagnosis not present

## 2016-09-07 DIAGNOSIS — I152 Hypertension secondary to endocrine disorders: Secondary | ICD-10-CM | POA: Insufficient documentation

## 2016-09-07 DIAGNOSIS — E559 Vitamin D deficiency, unspecified: Secondary | ICD-10-CM

## 2016-09-07 DIAGNOSIS — E785 Hyperlipidemia, unspecified: Secondary | ICD-10-CM | POA: Diagnosis not present

## 2016-09-07 DIAGNOSIS — K219 Gastro-esophageal reflux disease without esophagitis: Secondary | ICD-10-CM

## 2016-09-07 DIAGNOSIS — E1159 Type 2 diabetes mellitus with other circulatory complications: Secondary | ICD-10-CM | POA: Insufficient documentation

## 2016-09-07 DIAGNOSIS — Z6833 Body mass index (BMI) 33.0-33.9, adult: Secondary | ICD-10-CM

## 2016-09-07 LAB — CMP14+EGFR
A/G RATIO: 1.6 (ref 1.2–2.2)
ALBUMIN: 4.6 g/dL (ref 3.5–5.5)
ALT: 41 IU/L — ABNORMAL HIGH (ref 0–32)
AST: 28 IU/L (ref 0–40)
Alkaline Phosphatase: 56 IU/L (ref 39–117)
BILIRUBIN TOTAL: 0.2 mg/dL (ref 0.0–1.2)
BUN / CREAT RATIO: 18 (ref 9–23)
BUN: 13 mg/dL (ref 6–24)
CHLORIDE: 101 mmol/L (ref 96–106)
CO2: 26 mmol/L (ref 18–29)
Calcium: 9.8 mg/dL (ref 8.7–10.2)
Creatinine, Ser: 0.74 mg/dL (ref 0.57–1.00)
GFR calc Af Amer: 103 mL/min/{1.73_m2} (ref >59)
GFR calc non Af Amer: 89 mL/min/{1.73_m2} (ref >59)
Globulin, Total: 2.8 g/dL (ref 1.5–4.5)
Glucose: 130 mg/dL — ABNORMAL HIGH (ref 65–99)
Potassium: 5.1 mmol/L (ref 3.5–5.2)
Sodium: 140 mmol/L (ref 134–144)
TOTAL PROTEIN: 7.4 g/dL (ref 6.0–8.5)

## 2016-09-07 LAB — LIPID PANEL
CHOL/HDL RATIO: 2.5 ratio (ref 0.0–4.4)
Cholesterol, Total: 168 mg/dL (ref 100–199)
HDL: 66 mg/dL (ref >39)
LDL Calculated: 84 mg/dL (ref 0–99)
Triglycerides: 88 mg/dL (ref 0–149)
VLDL CHOLESTEROL CAL: 18 mg/dL (ref 5–40)

## 2016-09-07 LAB — BAYER DCA HB A1C WAIVED: HB A1C (BAYER DCA - WAIVED): 6.1 % (ref <7.0)

## 2016-09-07 MED ORDER — ESOMEPRAZOLE MAGNESIUM 40 MG PO CPDR: 40.0000 mg | DELAYED_RELEASE_CAPSULE | Freq: Every day | ORAL | 5 refills | Status: DC | PRN

## 2016-09-07 MED ORDER — LISINOPRIL 10 MG PO TABS: 10.0000 mg | ORAL_TABLET | Freq: Every day | ORAL | 1 refills | Status: DC

## 2016-09-07 MED ORDER — ROSUVASTATIN CALCIUM 10 MG PO TABS: 10.0000 mg | ORAL_TABLET | Freq: Every day | ORAL | 5 refills | Status: DC

## 2016-09-07 MED ORDER — METFORMIN HCL 500 MG PO TABS: ORAL_TABLET | ORAL | 5 refills | Status: DC

## 2016-09-07 NOTE — Progress Notes (Signed)
Subjective:    Patient ID: Katherine Ryan, female    DOB: [birth year], 60 y.o.   MRN: [id number]  HPI Patient here today for follow up of chronic medical problems. No changes since last visit. No complaints today.  Outpatient Encounter Prescriptions as of 09/07/2016  Medication Sig  . Cholecalciferol (VITAMIN D) 2000 UNITS CAPS Take by mouth.    . cyclobenzaprine (FLEXERIL) 5 MG tablet Take 1 tablet (5 mg total) by mouth 3 (three) times daily as needed for muscle spasms.  Marland Kitchen esomeprazole (NEXIUM) 40 MG capsule Take 1 capsule (40 mg total) by mouth daily as needed.  Marland Kitchen ibuprofen (ADVIL,MOTRIN) 200 MG tablet Take 200 mg by mouth every 6 (six) hours as needed.  . metFORMIN (GLUCOPHAGE) 500 MG tablet TAKE ONE TABLET BY MOUTH ONCE DAILY WITH  BREAKFAST  . naproxen (NAPROSYN) 500 MG tablet Take 1 tablet (500 mg total) by mouth 2 (two) times daily with a meal.  . rosuvastatin (CRESTOR) 10 MG tablet Take 1 tablet (10 mg total) by mouth daily.   No facility-administered encounter medications on file as of 09/07/2016.       Hyperlipidemia  This is a chronic problem. The current episode started more than 1 year ago. The problem is controlled. Recent lipid tests were reviewed and are normal. Exacerbating diseases include diabetes and obesity. She has no history of hypothyroidism. Pertinent negatives include no myalgias. Current antihyperlipidemic treatment includes statins. The current treatment provides moderate improvement of lipids. Compliance problems include adherence to diet and adherence to exercise.   Diabetes  She presents for her follow-up diabetic visit. She has type 2 diabetes mellitus. No MedicAlert identification noted. Her disease course has been stable. Pertinent negatives for diabetes include no fatigue, no polydipsia, no polyphagia, no polyuria and no weakness. There are no hypoglycemic complications. Symptoms are stable. There are no diabetic complications. Risk factors for coronary artery  disease include diabetes mellitus, dyslipidemia, obesity and post-menopausal. Current diabetic treatment includes oral agent (monotherapy). She is compliant with treatment most of the time. Her weight is stable. She is following a diabetic diet. She has not had a previous visit with a dietitian. She participates in exercise daily. Her breakfast blood glucose range is generally 90-110 mg/dl. Her overall blood glucose range is 90-110 mg/dl. An ACE inhibitor/angiotensin II receptor blocker is not being taken. She does not see a podiatrist.Eye exam is current.  GERD Nexium works well- no symptoms when takes meds osteopenia  Tries to walk 2-3 x a week- no c/o back pain Blind in right eye Congential cataract Vitamin d def Currently not on anything   Review of Systems  Constitutional: Negative for fatigue.  HENT: Negative.   Eyes: Negative.   Respiratory: Negative.   Cardiovascular: Negative.   Gastrointestinal: Negative.   Endocrine: Negative for polydipsia, polyphagia and polyuria.  Genitourinary: Negative.   Musculoskeletal: Negative for myalgias.  Neurological: Negative.  Negative for weakness.  Psychiatric/Behavioral: Negative.        Objective:   Physical Exam  Constitutional: She is oriented to person, place, and time. She appears well-developed and well-nourished.  HENT:  Nose: Nose normal.  Mouth/Throat: Oropharynx is clear and moist.  Eyes: EOM are normal.  Neck: Trachea normal, normal range of motion and full passive range of motion without pain. Neck supple. No JVD present. Carotid bruit is not present. No thyromegaly present.  Cardiovascular: Normal rate, regular rhythm, normal heart sounds and intact distal pulses.  Exam reveals no gallop and no friction rub.  No murmur heard. Pulmonary/Chest: Effort normal and breath sounds normal.  Abdominal: Soft. Bowel sounds are normal. She exhibits no distension and no mass. There is tenderness (mild suprapubic pain on palpation).   Genitourinary:  Genitourinary Comments: No CVA tenderness  Musculoskeletal: Normal range of motion.  Lymphadenopathy:    She has no cervical adenopathy.  Neurological: She is alert and oriented to person, place, and time. She has normal reflexes.  Skin: Skin is warm and dry.  Psychiatric: She has a normal mood and affect. Her behavior is normal. Judgment and thought content normal.   BP (!) 144/88 (BP Location: Left Arm, Cuff Size: Normal)   Pulse 68   Temp 97.4 F (36.3 C) (Oral)   Ht '5\' 2"'  (1.575 m)   Wt 186 lb (84.4 kg)   LMP 04/04/2005   BMI 34.02 kg/m   hgba1c 6.1%       Assessment & Plan:  1. Gastroesophageal reflux disease without esophagitis Avoid spicy foods Do not eat 2 hours prior to bedtime - esomeprazole (NEXIUM) 40 MG capsule; Take 1 capsule (40 mg total) by mouth daily as needed.  Dispense: 30 capsule; Refill: 5  2. Type 2 diabetes mellitus without complication, without long-term current use of insulin (HCC) Continue  To watch cabs in diet - Bayer DCA Hb A1c Waived - metFORMIN (GLUCOPHAGE) 500 MG tablet; TAKE ONE TABLET BY MOUTH ONCE DAILY WITH  BREAKFAST  Dispense: 30 tablet; Refill: 5  3. BMI 33.0-33.9,adult Discussed diet and exercise for person with BMI >25 Will recheck weight in 3-6 months  4. Hyperlipidemia with target LDL less than 100 Low fat diet - CMP14+EGFR - Lipid panel - rosuvastatin (CRESTOR) 10 MG tablet; Take 1 tablet (10 mg total) by mouth daily.  Dispense: 30 tablet; Refill: 5  5. Vitamin D deficiency  6. Essential hypertension Do not add salt to diet This is a new diagnosis - lisinopril (PRINIVIL,ZESTRIL) 10 MG tablet; Take 1 tablet (10 mg total) by mouth daily.  Dispense: 90 tablet; Refill: 1    Labs pending Health maintenance reviewed Diet and exercise encouraged Continue all meds Follow up  In 3 months   [place], FNP

## 2016-11-29 ENCOUNTER — Other Ambulatory Visit: Payer: Self-pay | Admitting: Nurse Practitioner

## 2016-11-29 DIAGNOSIS — M545 Low back pain, unspecified: Secondary | ICD-10-CM

## 2016-11-29 NOTE — Telephone Encounter (Signed)
Patient last seen September 2017

## 2016-12-14 ENCOUNTER — Ambulatory Visit (INDEPENDENT_AMBULATORY_CARE_PROVIDER_SITE_OTHER): Payer: BLUE CROSS/BLUE SHIELD | Admitting: Nurse Practitioner

## 2016-12-14 ENCOUNTER — Encounter: Payer: Self-pay | Admitting: Nurse Practitioner

## 2016-12-14 VITALS — BP 132/76 | HR 66 | Temp 97.3°F | Ht 62.0 in | Wt 183.0 lb

## 2016-12-14 DIAGNOSIS — E559 Vitamin D deficiency, unspecified: Secondary | ICD-10-CM | POA: Diagnosis not present

## 2016-12-14 DIAGNOSIS — E119 Type 2 diabetes mellitus without complications: Secondary | ICD-10-CM

## 2016-12-14 DIAGNOSIS — E785 Hyperlipidemia, unspecified: Secondary | ICD-10-CM | POA: Diagnosis not present

## 2016-12-14 DIAGNOSIS — K219 Gastro-esophageal reflux disease without esophagitis: Secondary | ICD-10-CM

## 2016-12-14 DIAGNOSIS — Z6833 Body mass index (BMI) 33.0-33.9, adult: Secondary | ICD-10-CM | POA: Diagnosis not present

## 2016-12-14 DIAGNOSIS — I10 Essential (primary) hypertension: Secondary | ICD-10-CM | POA: Diagnosis not present

## 2016-12-14 DIAGNOSIS — M8580 Other specified disorders of bone density and structure, unspecified site: Secondary | ICD-10-CM

## 2016-12-14 LAB — BAYER DCA HB A1C WAIVED: HB A1C: 6.5 % (ref <7.0)

## 2016-12-14 MED ORDER — ROSUVASTATIN CALCIUM 10 MG PO TABS: 10.0000 mg | ORAL_TABLET | Freq: Every day | ORAL | 1 refills | Status: DC

## 2016-12-14 MED ORDER — ESOMEPRAZOLE MAGNESIUM 40 MG PO CPDR: 40.0000 mg | DELAYED_RELEASE_CAPSULE | Freq: Every day | ORAL | 1 refills | Status: DC | PRN

## 2016-12-14 MED ORDER — LISINOPRIL 10 MG PO TABS: 10.0000 mg | ORAL_TABLET | Freq: Every day | ORAL | 1 refills | Status: DC

## 2016-12-14 MED ORDER — METFORMIN HCL 500 MG PO TABS: ORAL_TABLET | ORAL | 1 refills | Status: DC

## 2016-12-14 NOTE — Progress Notes (Signed)
Subjective:    Patient ID: Katherine Ryan, female    DOB: Jan 19, 1956, 60 y.o.   MRN: [id number]  HPI Patient here today for follow up of chronic medical problems. No changes since last visit. No complaints today.  Outpatient Encounter Prescriptions as of 12/14/2016  Medication Sig  . cetirizine (ZYRTEC) 10 MG tablet Take 10 mg by mouth daily.  . Cholecalciferol (VITAMIN D) 2000 UNITS CAPS Take by mouth.    . cyclobenzaprine (FLEXERIL) 5 MG tablet TAKE ONE TABLET BY MOUTH THREE TIMES DAILY AS NEEDED FOR MUSCLE SPASMS  . esomeprazole (NEXIUM) 40 MG capsule Take 1 capsule (40 mg total) by mouth daily as needed.  Marland Kitchen ibuprofen (ADVIL,MOTRIN) 200 MG tablet Take 200 mg by mouth every 6 (six) hours as needed.  Marland Kitchen lisinopril (PRINIVIL,ZESTRIL) 10 MG tablet Take 1 tablet (10 mg total) by mouth daily.  . metFORMIN (GLUCOPHAGE) 500 MG tablet TAKE ONE TABLET BY MOUTH ONCE DAILY WITH  BREAKFAST  . naproxen (NAPROSYN) 500 MG tablet Take 1 tablet (500 mg total) by mouth 2 (two) times daily with a meal.  . rosuvastatin (CRESTOR) 10 MG tablet Take 1 tablet (10 mg total) by mouth daily.   No facility-administered encounter medications on file as of 12/14/2016.       Hyperlipidemia  This is a chronic problem. The current episode started more than 1 year ago. The problem is controlled. Recent lipid tests were reviewed and are normal. Exacerbating diseases include diabetes and obesity. She has no history of hypothyroidism. Pertinent negatives include no myalgias. Current antihyperlipidemic treatment includes statins. The current treatment provides moderate improvement of lipids. Compliance problems include adherence to diet and adherence to exercise.   Diabetes  She presents for her follow-up diabetic visit. She has type 2 diabetes mellitus. No MedicAlert identification noted. Her disease course has been stable. Pertinent negatives for diabetes include no fatigue, no polydipsia, no polyphagia, no polyuria and no  weakness. There are no hypoglycemic complications. Symptoms are stable. There are no diabetic complications. Risk factors for coronary artery disease include diabetes mellitus, dyslipidemia, obesity and post-menopausal. Current diabetic treatment includes oral agent (monotherapy). She is compliant with treatment most of the time. Her weight is stable. She is following a diabetic diet. She has not had a previous visit with a dietitian. She participates in exercise daily. Her breakfast blood glucose range is generally 90-110 mg/dl. Her overall blood glucose range is 90-110 mg/dl. An ACE inhibitor/angiotensin II receptor blocker is not being taken. She does not see a podiatrist.Eye exam is current.  GERD Nexium works well- no symptoms when takes meds osteopenia  Tries to walk 2-3 x a week- no c/o back pain Blind in right eye Congential cataract Vitamin d def Currently not on anything   Review of Systems  Constitutional: Negative for fatigue.  HENT: Negative.   Eyes: Negative.   Respiratory: Negative.   Cardiovascular: Negative.   Gastrointestinal: Negative.   Endocrine: Negative for polydipsia, polyphagia and polyuria.  Genitourinary: Negative.   Musculoskeletal: Negative for myalgias.  Neurological: Negative.  Negative for weakness.  Psychiatric/Behavioral: Negative.        Objective:   Physical Exam  Constitutional: She is oriented to person, place, and time. She appears well-developed and well-nourished.  HENT:  Nose: Nose normal.  Mouth/Throat: Oropharynx is clear and moist.  Eyes: EOM are normal.  Neck: Trachea normal, normal range of motion and full passive range of motion without pain. Neck supple. No JVD present. Carotid bruit is not present. No  thyromegaly present.  Cardiovascular: Normal rate, regular rhythm, normal heart sounds and intact distal pulses.  Exam reveals no gallop and no friction rub.   No murmur heard. Pulmonary/Chest: Effort normal and breath sounds normal.   Abdominal: Soft. Bowel sounds are normal. She exhibits no distension and no mass. There is tenderness (mild suprapubic pain on palpation).  Genitourinary:  Genitourinary Comments: No CVA tenderness  Musculoskeletal: Normal range of motion.  Lymphadenopathy:    She has no cervical adenopathy.  Neurological: She is alert and oriented to person, place, and time. She has normal reflexes.  Skin: Skin is warm and dry.  Psychiatric: She has a normal mood and affect. Her behavior is normal. Judgment and thought content normal.   BP 132/76   Pulse 66   Temp 97.3 F (36.3 C) (Oral)   Ht '5\' 2"'  (1.575 m)   Wt 183 lb (83 kg)   LMP 04/04/2005   BMI 33.47 kg/m    hgba1c 6.5%       Assessment & Plan:  1. Type 2 diabetes mellitus without complication, without long-term current use of insulin (HCC) Carb counting - Bayer DCA Hb A1c Waived - CMP14+EGFR - Microalbumin / creatinine urine ratio - metFORMIN (GLUCOPHAGE) 500 MG tablet; TAKE ONE TABLET BY MOUTH ONCE DAILY WITH  BREAKFAST  Dispense: 90 tablet; Refill: 1  2. Essential hypertension Low fat diet - lisinopril (PRINIVIL,ZESTRIL) 10 MG tablet; Take 1 tablet (10 mg total) by mouth daily.  Dispense: 90 tablet; Refill: 1  3. Gastroesophageal reflux disease without esophagitis Avoid spicy foods Do not eat 2 hours prior to bedtime - esomeprazole (NEXIUM) 40 MG capsule; Take 1 capsule (40 mg total) by mouth daily as needed.  Dispense: 90 capsule; Refill: 1  4. Osteopenia, unspecified location Weight bearing exercise  5. BMI 33.0-33.9,adult Discussed diet and exercise for person with BMI >25 Will recheck weight in 3-6 months  6. Hyperlipidemia with target LDL less than 100 Low fat diet - Lipid panel - rosuvastatin (CRESTOR) 10 MG tablet; Take 1 tablet (10 mg total) by mouth daily.  Dispense: 90 tablet; Refill: 1  7. Vitamin D deficiency    Labs pending Health maintenance reviewed Diet and exercise encouraged Continue all  meds Follow up  In 3 months   [place], FNP

## 2016-12-14 NOTE — Patient Instructions (Signed)

## 2016-12-15 LAB — LIPID PANEL
CHOL/HDL RATIO: 2.8 ratio (ref 0.0–4.4)
Cholesterol, Total: 177 mg/dL (ref 100–199)
HDL: 64 mg/dL (ref >39)
LDL CALC: 94 mg/dL (ref 0–99)
Triglycerides: 95 mg/dL (ref 0–149)
VLDL CHOLESTEROL CAL: 19 mg/dL (ref 5–40)

## 2016-12-15 LAB — CMP14+EGFR
ALK PHOS: 58 IU/L (ref 39–117)
ALT: 34 IU/L — ABNORMAL HIGH (ref 0–32)
AST: 23 IU/L (ref 0–40)
Albumin/Globulin Ratio: 1.7 (ref 1.2–2.2)
Albumin: 4.6 g/dL (ref 3.6–4.8)
BUN/Creatinine Ratio: 28 (ref 12–28)
BUN: 19 mg/dL (ref 8–27)
CHLORIDE: 97 mmol/L (ref 96–106)
CO2: 27 mmol/L (ref 18–29)
Calcium: 10.1 mg/dL (ref 8.7–10.3)
Creatinine, Ser: 0.67 mg/dL (ref 0.57–1.00)
GFR calc non Af Amer: 96 mL/min/{1.73_m2} (ref >59)
GFR, EST AFRICAN AMERICAN: 110 mL/min/{1.73_m2} (ref >59)
GLUCOSE: 120 mg/dL — AB (ref 65–99)
Globulin, Total: 2.7 g/dL (ref 1.5–4.5)
Potassium: 5 mmol/L (ref 3.5–5.2)
Sodium: 139 mmol/L (ref 134–144)
TOTAL PROTEIN: 7.3 g/dL (ref 6.0–8.5)

## 2016-12-15 LAB — MICROALBUMIN / CREATININE URINE RATIO: CREATININE, UR: 7.2 mg/dL

## 2017-03-17 ENCOUNTER — Encounter: Payer: Self-pay | Admitting: Nurse Practitioner

## 2017-03-17 ENCOUNTER — Ambulatory Visit (INDEPENDENT_AMBULATORY_CARE_PROVIDER_SITE_OTHER): Payer: BLUE CROSS/BLUE SHIELD | Admitting: Nurse Practitioner

## 2017-03-17 VITALS — BP 132/69 | HR 69 | Temp 97.4°F

## 2017-03-17 DIAGNOSIS — E119 Type 2 diabetes mellitus without complications: Secondary | ICD-10-CM | POA: Diagnosis not present

## 2017-03-17 DIAGNOSIS — I10 Essential (primary) hypertension: Secondary | ICD-10-CM | POA: Diagnosis not present

## 2017-03-17 DIAGNOSIS — E785 Hyperlipidemia, unspecified: Secondary | ICD-10-CM

## 2017-03-17 DIAGNOSIS — E559 Vitamin D deficiency, unspecified: Secondary | ICD-10-CM

## 2017-03-17 DIAGNOSIS — K219 Gastro-esophageal reflux disease without esophagitis: Secondary | ICD-10-CM

## 2017-03-17 LAB — BAYER DCA HB A1C WAIVED: HB A1C (BAYER DCA - WAIVED): 6.8 % (ref <7.0)

## 2017-03-17 MED ORDER — ROSUVASTATIN CALCIUM 10 MG PO TABS: 10.0000 mg | ORAL_TABLET | Freq: Every day | ORAL | 1 refills | Status: DC

## 2017-03-17 MED ORDER — LISINOPRIL 10 MG PO TABS: 10.0000 mg | ORAL_TABLET | Freq: Every day | ORAL | 1 refills | Status: DC

## 2017-03-17 MED ORDER — METFORMIN HCL 500 MG PO TABS: ORAL_TABLET | ORAL | 1 refills | Status: DC

## 2017-03-17 MED ORDER — LOSARTAN POTASSIUM 50 MG PO TABS: 50.0000 mg | ORAL_TABLET | Freq: Every day | ORAL | 3 refills | Status: DC

## 2017-03-17 MED ORDER — VITAMIN D 50 MCG (2000 UT) PO CAPS: 1.0000 | ORAL_CAPSULE | Freq: Every day | ORAL | 5 refills | Status: DC

## 2017-03-17 MED ORDER — ESOMEPRAZOLE MAGNESIUM 40 MG PO CPDR: 40.0000 mg | DELAYED_RELEASE_CAPSULE | Freq: Every day | ORAL | 1 refills | Status: DC | PRN

## 2017-03-17 NOTE — Progress Notes (Signed)
Subjective:    Patient ID: Katherine Ryan, female    DOB: 1956-10-12, 61 y.o.   MRN: [id number]  HPI Patient here today for follow up of chronic medical problems. No changes since last visit. No complaints today.  Outpatient Encounter Prescriptions as of 03/17/2017  Medication Sig  . Cholecalciferol (VITAMIN D) 2000 UNITS CAPS Take by mouth.    . cyclobenzaprine (FLEXERIL) 5 MG tablet TAKE ONE TABLET BY MOUTH THREE TIMES DAILY AS NEEDED FOR MUSCLE SPASMS  . esomeprazole (NEXIUM) 40 MG capsule Take 1 capsule (40 mg total) by mouth daily as needed.  Marland Kitchen ibuprofen (ADVIL,MOTRIN) 200 MG tablet Take 200 mg by mouth every 6 (six) hours as needed.  Marland Kitchen lisinopril (PRINIVIL,ZESTRIL) 10 MG tablet Take 1 tablet (10 mg total) by mouth daily.  . metFORMIN (GLUCOPHAGE) 500 MG tablet TAKE ONE TABLET BY MOUTH ONCE DAILY WITH  BREAKFAST  . rosuvastatin (CRESTOR) 10 MG tablet Take 1 tablet (10 mg total) by mouth daily.   No facility-administered encounter medications on file as of 03/17/2017.       Hyperlipidemia  This is a chronic problem. The current episode started more than 1 year ago. The problem is controlled. Recent lipid tests were reviewed and are normal. Exacerbating diseases include diabetes and obesity. She has no history of hypothyroidism. Pertinent negatives include no myalgias. Current antihyperlipidemic treatment includes statins. The current treatment provides moderate improvement of lipids. Compliance problems include adherence to diet and adherence to exercise.   Diabetes  She presents for her follow-up diabetic visit. She has type 2 diabetes mellitus. No MedicAlert identification noted. Her disease course has been stable. Pertinent negatives for diabetes include no fatigue, no polydipsia, no polyphagia, no polyuria and no weakness. There are no hypoglycemic complications. Symptoms are stable. There are no diabetic complications. Risk factors for coronary artery disease include diabetes mellitus,  dyslipidemia, obesity and post-menopausal. Current diabetic treatment includes oral agent (monotherapy). She is compliant with treatment most of the time. Her weight is stable. She is following a diabetic diet. She has not had a previous visit with a dietitian. She participates in exercise daily. Her breakfast blood glucose range is generally 90-110 mg/dl. Her overall blood glucose range is 90-110 mg/dl. An ACE inhibitor/angiotensin II receptor blocker is not being taken. She does not see a podiatrist.Eye exam is current.  GERD Nexium works well- no symptoms when takes meds osteopenia  Tries to walk 2-3 x a week- no c/o back pain Blind in right eye Congential cataract Vitamin d def Currently not on anything   Review of Systems  Constitutional: Negative for fatigue.  HENT: Negative.   Eyes: Negative.   Respiratory: Negative.   Cardiovascular: Negative.   Gastrointestinal: Negative.   Endocrine: Negative for polydipsia, polyphagia and polyuria.  Genitourinary: Negative.   Musculoskeletal: Negative for myalgias.  Neurological: Negative.  Negative for weakness.  Psychiatric/Behavioral: Negative.        Objective:   Physical Exam  Constitutional: She is oriented to person, place, and time. She appears well-developed and well-nourished.  HENT:  Nose: Nose normal.  Mouth/Throat: Oropharynx is clear and moist.  Dry cough  Eyes: EOM are normal.  Neck: Trachea normal, normal range of motion and full passive range of motion without pain. Neck supple. No JVD present. Carotid bruit is not present. No thyromegaly present.  Cardiovascular: Normal rate, regular rhythm, normal heart sounds and intact distal pulses.  Exam reveals no gallop and no friction rub.   No murmur heard. Pulmonary/Chest: Effort normal  and breath sounds normal.  Abdominal: Soft. Bowel sounds are normal. She exhibits no distension and no mass. Tenderness: mild suprapubic pain on palpation.  Genitourinary:  Genitourinary  Comments: No CVA tenderness  Musculoskeletal: Normal range of motion.  Lymphadenopathy:    She has no cervical adenopathy.  Neurological: She is alert and oriented to person, place, and time. She has normal reflexes.  Skin: Skin is warm and dry.  Psychiatric: She has a normal mood and affect. Her behavior is normal. Judgment and thought content normal.   LMP 04/04/2005   BP 132/69   Pulse 69   Temp 97.4 F (36.3 C) (Oral)   LMP 04/04/2005   hgba1c 6.8% up  from 6.5% last visit     Assessment & Plan:   1. Type 2 diabetes mellitus without complication, without long-term current use of insulin (HCC) Low carb diet and daily exercise.  - Bayer DCA Hb A1c Waived - metFORMIN (GLUCOPHAGE) 500 MG tablet; TAKE ONE TABLET BY MOUTH ONCE DAILY WITH  BREAKFAST  Dispense: 90 tablet; Refill: 1  2. Essential hypertension DASH diet.  - CMP14+EGFR -Changed from Lisinopril 35m to Losartan 592md/t cough.   3. Hyperlipidemia with target LDL less than 100 Low fat diet and daily exercise.  - Lipid panel - rosuvastatin (CRESTOR) 10 MG tablet; Take 1 tablet (10 mg total) by mouth daily.  Dispense: 90 tablet; Refill: 1  4. Gastroesophageal reflux disease without esophagitis Avoid spicy foods Do not eat 2 hours prior to bedtime - esomeprazole (NEXIUM) 40 MG capsule; Take 1 capsule (40 mg total) by mouth daily as needed.  Dispense: 90 capsule; Refill: 1  5. Vitamin D deficiency Take medication as prescribed.  - Cholecalciferol (VITAMIN D) 2000 units CAPS; Take 1 capsule (2,000 Units total) by mouth daily.  Dispense: 30 capsule; Refill: 5    Labs pending Health maintenance reviewed Diet and exercise encouraged Continue all meds Follow up  In 3 months   Mo[place]ionisio DavidFNP student [place]

## 2017-03-17 NOTE — Patient Instructions (Signed)
Carbohydrate Counting for Diabetes Mellitus, Adult Carbohydrate counting is a method for keeping track of how many carbohydrates you eat. Eating carbohydrates naturally increases the amount of sugar (glucose) in the blood. Counting how many carbohydrates you eat helps keep your blood glucose within normal limits, which helps you manage your diabetes (diabetes mellitus). It is important to know how many carbohydrates you can safely have in each meal. This is different for every person. A diet and nutrition specialist (registered dietitian) can help you make a meal plan and calculate how many carbohydrates you should have at each meal and snack. Carbohydrates are found in the following foods:  Grains, such as breads and cereals.  Dried beans and soy products.  Starchy vegetables, such as potatoes, peas, and corn.  Fruit and fruit juices.  Milk and yogurt.  Sweets and snack foods, such as cake, cookies, candy, chips, and soft drinks. How do I count carbohydrates? There are two ways to count carbohydrates in food. You can use either of the methods or a combination of both. Reading "Nutrition Facts" on packaged food  The "Nutrition Facts" list is included on the labels of almost all packaged foods and beverages in the [place] It includes:  The serving size.  Information about nutrients in each serving, including the grams (g) of carbohydrate per serving. To use the "Nutrition Facts":  Decide how many servings you will have.  Multiply the number of servings by the number of carbohydrates per serving.  The resulting number is the total amount of carbohydrates that you will be having. Learning standard serving sizes of other foods  When you eat foods containing carbohydrates that are not packaged or do not include "Nutrition Facts" on the label, you need to measure the servings in order to count the amount of carbohydrates:  Measure the foods that you will eat with a food scale or measuring  cup, if needed.  Decide how many standard-size servings you will eat.  Multiply the number of servings by 15. Most carbohydrate-rich foods have about 15 g of carbohydrates per serving.  For example, if you eat 8 oz (170 g) of strawberries, you will have eaten 2 servings and 30 g of carbohydrates (2 servings x 15 g = 30 g).  For foods that have more than one food mixed, such as soups and casseroles, you must count the carbohydrates in each food that is included. The following list contains standard serving sizes of common carbohydrate-rich foods. Each of these servings has about 15 g of carbohydrates:   hamburger bun or  English muffin.   oz (15 mL) syrup.   oz (14 g) jelly.  1 slice of bread.  1 six-inch tortilla.  3 oz (85 g) cooked rice or pasta.  4 oz (113 g) cooked dried beans.  4 oz (113 g) starchy vegetable, such as peas, corn, or potatoes.  4 oz (113 g) hot cereal.  4 oz (113 g) mashed potatoes or  of a large baked potato.  4 oz (113 g) canned or frozen fruit.  4 oz (120 mL) fruit juice.  4-6 crackers.  6 chicken nuggets.  6 oz (170 g) unsweetened dry cereal.  6 oz (170 g) plain fat-free yogurt or yogurt sweetened with artificial sweeteners.  8 oz (240 mL) milk.  8 oz (170 g) fresh fruit or one small piece of fruit.  24 oz (680 g) popped popcorn. Example of carbohydrate counting Sample meal  3 oz (85 g) chicken breast.  6 oz (  170 g) brown rice.  4 oz (113 g) corn.  8 oz (240 mL) milk.  8 oz (170 g) strawberries with sugar-free whipped topping. Carbohydrate calculation 1. Identify the foods that contain carbohydrates:  Rice.  Corn.  Milk.  Strawberries. 2. Calculate how many servings you have of each food:  2 servings rice.  1 serving corn.  1 serving milk.  1 serving strawberries. 3. Multiply each number of servings by 15 g:  2 servings rice x 15 g = 30 g.  1 serving corn x 15 g = 15 g.  1 serving milk x 15 g = 15  g.  1 serving strawberries x 15 g = 15 g. 4. Add together all of the amounts to find the total grams of carbohydrates eaten:  30 g + 15 g + 15 g + 15 g = 75 g of carbohydrates total. This information is not intended to replace advice given to you by your health care provider. Make sure you discuss any questions you have with your health care provider. Document Released: 12/13/2005 Document Revised: 07/02/2016 Document Reviewed: 05/26/2016 Elsevier Interactive Patient Education  2017 [employer]  

## 2017-03-18 LAB — CMP14+EGFR
ALBUMIN: 4.4 g/dL (ref 3.6–4.8)
ALK PHOS: 57 IU/L (ref 39–117)
ALT: 37 IU/L — ABNORMAL HIGH (ref 0–32)
AST: 26 IU/L (ref 0–40)
Albumin/Globulin Ratio: 1.5 (ref 1.2–2.2)
BUN / CREAT RATIO: 18 (ref 12–28)
BUN: 12 mg/dL (ref 8–27)
Bilirubin Total: 0.2 mg/dL (ref 0.0–1.2)
CO2: 26 mmol/L (ref 18–29)
CREATININE: 0.65 mg/dL (ref 0.57–1.00)
Calcium: 9.8 mg/dL (ref 8.7–10.3)
Chloride: 96 mmol/L (ref 96–106)
GFR calc non Af Amer: 97 mL/min/{1.73_m2} (ref >59)
GFR, EST AFRICAN AMERICAN: 112 mL/min/{1.73_m2} (ref >59)
GLOBULIN, TOTAL: 2.9 g/dL (ref 1.5–4.5)
Glucose: 114 mg/dL — ABNORMAL HIGH (ref 65–99)
Potassium: 5.2 mmol/L (ref 3.5–5.2)
SODIUM: 137 mmol/L (ref 134–144)
TOTAL PROTEIN: 7.3 g/dL (ref 6.0–8.5)

## 2017-03-18 LAB — LIPID PANEL
CHOL/HDL RATIO: 2.7 ratio (ref 0.0–4.4)
Cholesterol, Total: 179 mg/dL (ref 100–199)
HDL: 66 mg/dL (ref >39)
LDL CALC: 92 mg/dL (ref 0–99)
Triglycerides: 104 mg/dL (ref 0–149)
VLDL CHOLESTEROL CAL: 21 mg/dL (ref 5–40)

## 2017-03-22 ENCOUNTER — Other Ambulatory Visit (INDEPENDENT_AMBULATORY_CARE_PROVIDER_SITE_OTHER): Payer: BLUE CROSS/BLUE SHIELD

## 2017-03-22 DIAGNOSIS — Z1212 Encounter for screening for malignant neoplasm of rectum: Secondary | ICD-10-CM | POA: Diagnosis not present

## 2017-03-24 LAB — FECAL OCCULT BLOOD, IMMUNOCHEMICAL: Fecal Occult Bld: NEGATIVE

## 2017-04-06 IMAGING — MR MR HEAD WO/W CM
10 of 12 series · 25 of 48 positions shown · IV contrast (multihance)
Comparison: None.

CLINICAL DATA: Vertigo.

EXAM:
MRI HEAD WITHOUT AND WITH CONTRAST
TECHNIQUE: Multiplanar, multiecho pulse sequences of the brain and surrounding
structures were obtained without and with intravenous contrast.
CONTRAST:  16mL MULTIHANCE GADOBENATE DIMEGLUMINE 529 MG/ML IV SOLN

[Series 2: T2 · axial · 5.0mm · 0.43mm/px · z∈[-76,+73]mm · 3 of 26 slices shown]
[im 1/26]
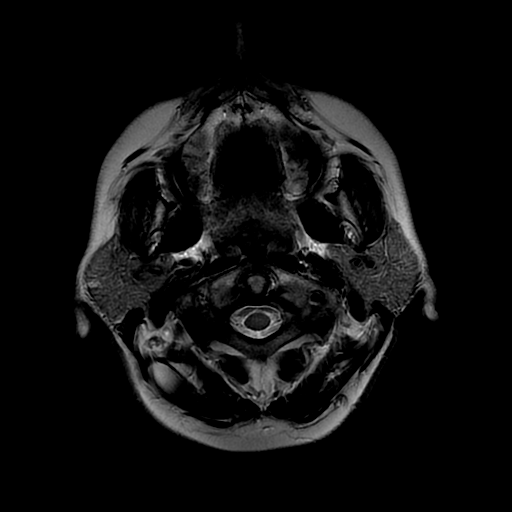
[im 13/26]
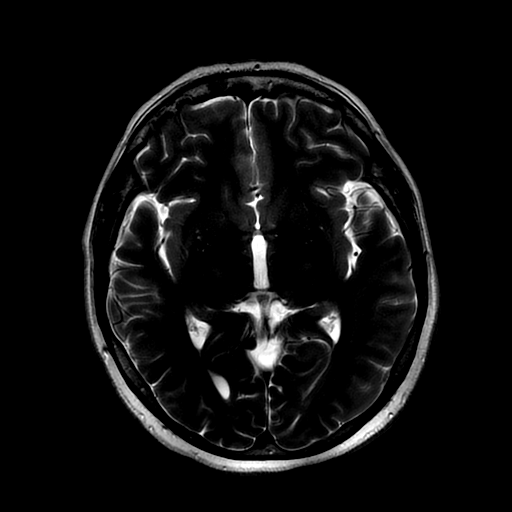
[im 26/26]
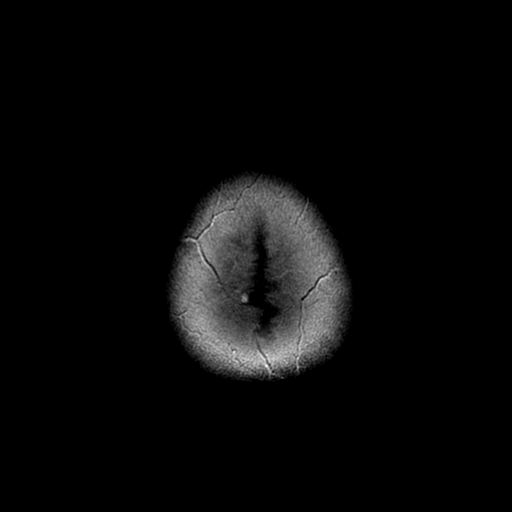

[Series 3: FLAIR · axial · 5.0mm · 0.43mm/px · z∈[-76,+73]mm · 3 of 26 slices shown (1 of 2)]
[im 1/26]
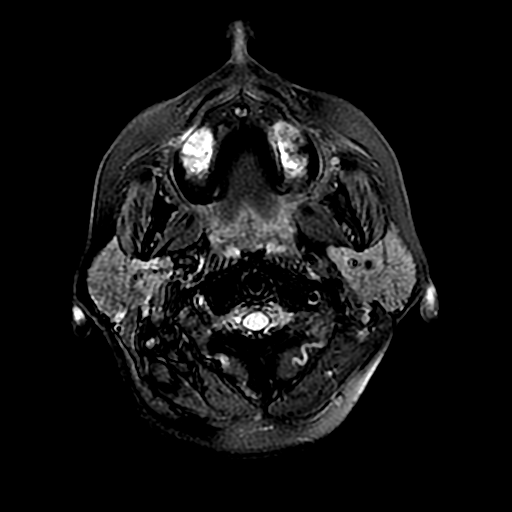
[im 13/26]
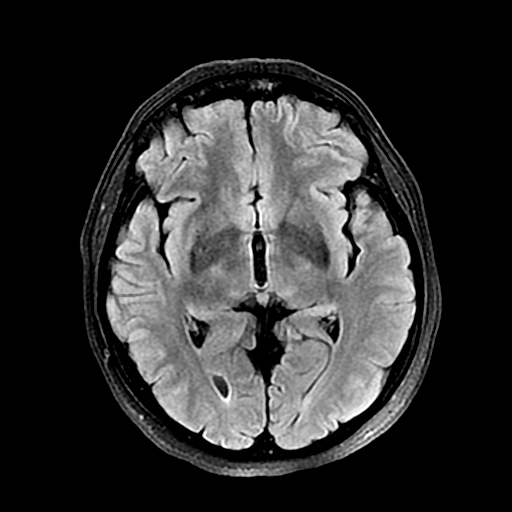
[im 26/26]
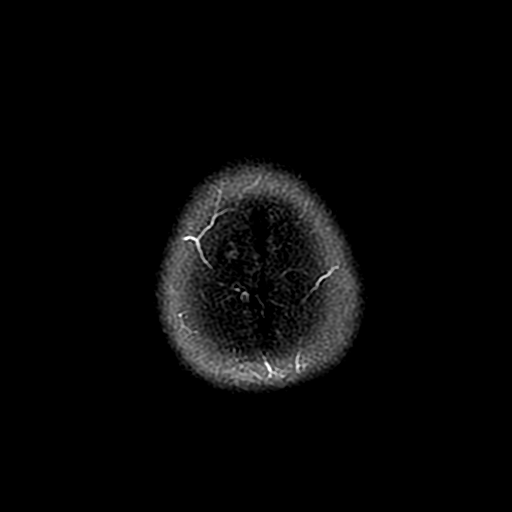

[Series 4: FLAIR · sagittal · 5.0mm · 0.47mm/px · 2 of 23 slices shown (2 of 2)]
[im 1/23]
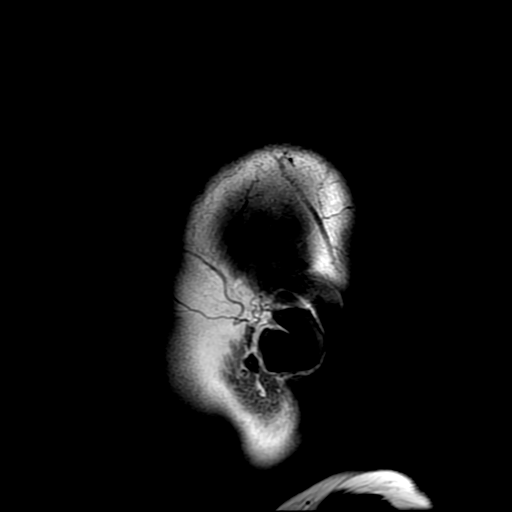
[im 23/23]
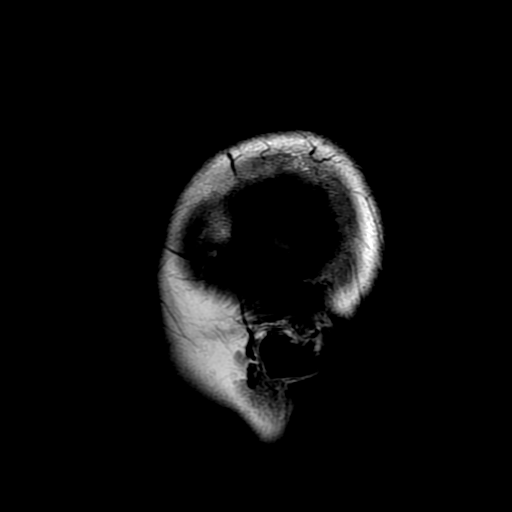

[Series 6: DWI · axial · 5.0mm · 1.09mm/px · z∈[-77,+72]mm · 6 of 62 slices shown (1 of 2)]
[im 1/62]
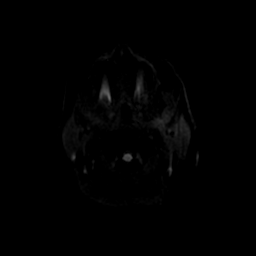
[im 13/62]
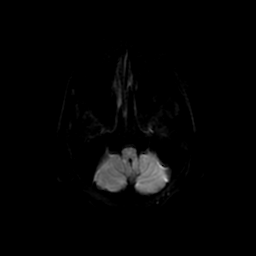
[im 25/62]
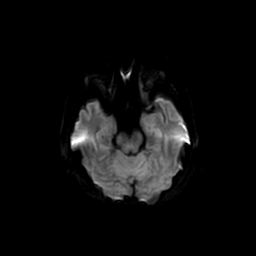
[im 37/62]
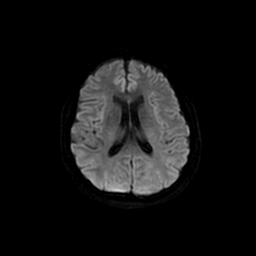
[im 49/62]
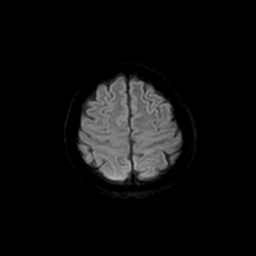
[im 62/62]
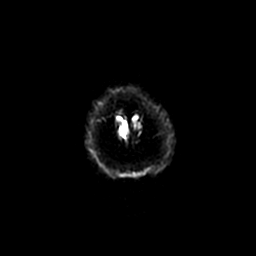

[Series 8: T1 · axial · 3.0mm · 0.35mm/px · 1 of 13 slices shown (1 of 5)]
[im 1/13]
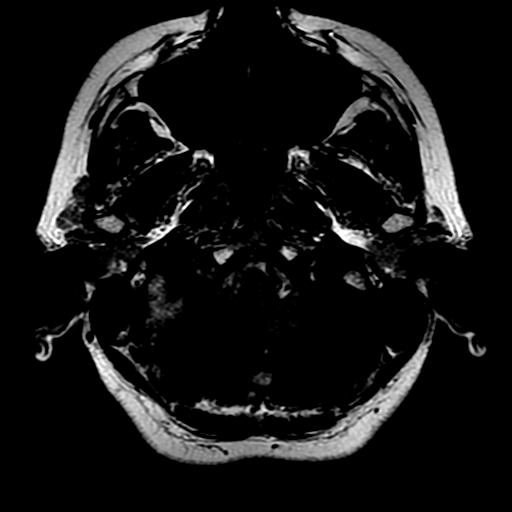

[Series 9: T1 · coronal · 3.0mm · 0.35mm/px · 2 of 19 slices shown (2 of 5)]
[im 1/19]
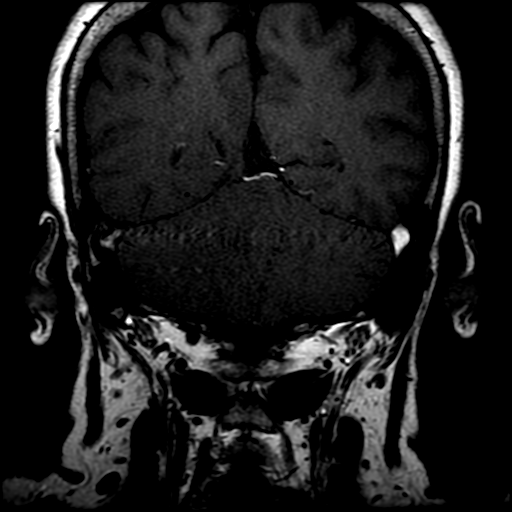
[im 19/19]
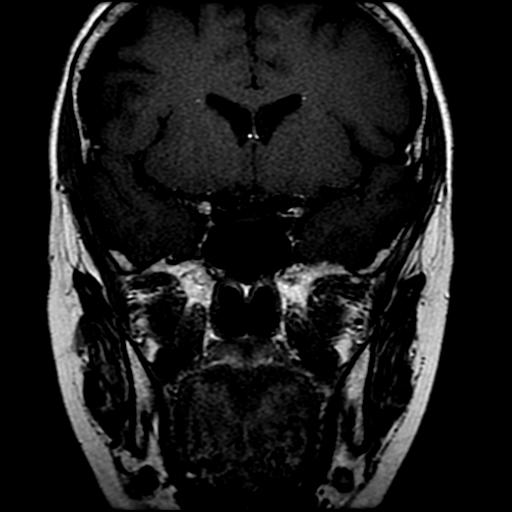

[Series 10: T1 · axial · 3.0mm · 0.35mm/px · 1 of 13 slices shown (3 of 5)]
[im 1/13]
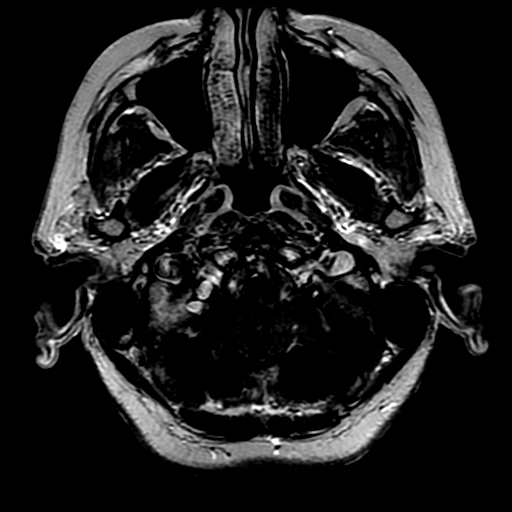

[Series 11: T1 · coronal · 3.0mm · 0.35mm/px · 2 of 19 slices shown (4 of 5)]
[im 1/19]
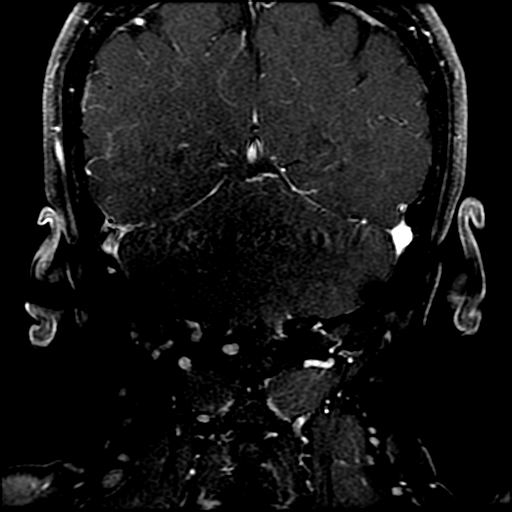
[im 19/19]
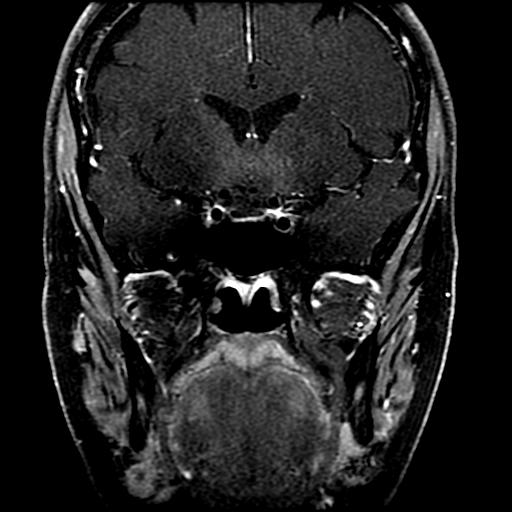

[Series 13: T1 · coronal · 3.0mm · 0.35mm/px · 2 of 19 slices shown (5 of 5)]
[im 1/19]
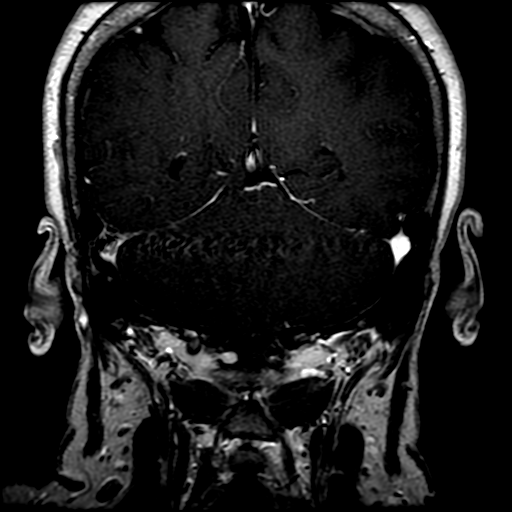
[im 19/19]
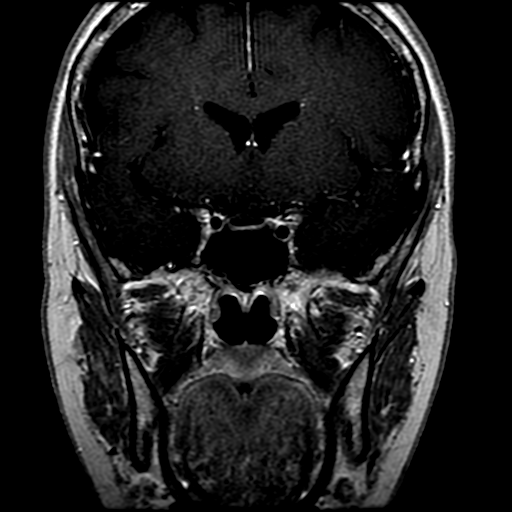

[Series 600: DWI · axial · 5.0mm · 1.09mm/px · z∈[-77,+72]mm · 3 of 31 slices shown (2 of 2)]
[im 1/31]
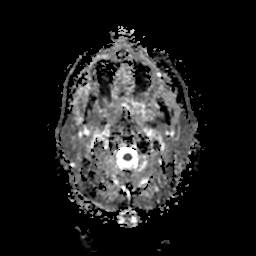
[im 16/31]
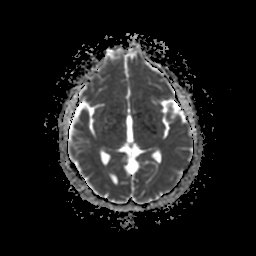
[im 31/31]
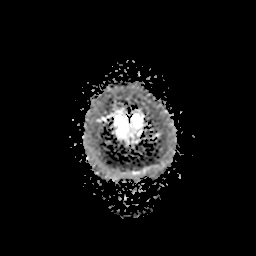

[25 of 48 positions shown; findings below may reference images not displayed]

FINDINGS: There is no evidence of acute infarct, intracranial hemorrhage,
mass, midline shift, or extra-axial fluid collection. Ventricles and
sulci are normal. No significant white matter disease is identified.
A tiny incidental developmental venous anomaly is noted in the left
frontal lobe.

The right ocular lens is dislocated posteriorly in the globe, and
right globe is also misshapen. Paranasal sinuses and mastoid air
cells are clear. Major intracranial vascular flow voids are
preserved.

Dedicated imaging through the internal auditory canals demonstrates
a normal course of cranial nerves VII and VIII without evidence of
mass or abnormal enhancement. Inner ear structures demonstrate
normal signal bilaterally. No mass is seen within the
cerebellopontine angles.
IMPRESSION: 1. Unremarkable appearance of the internal auditory canals.
2. Unremarkable appearance of the brain aside from an incidental
developmental venous anomaly.

## 2017-06-14 ENCOUNTER — Other Ambulatory Visit: Payer: Self-pay | Admitting: Nurse Practitioner

## 2017-06-14 DIAGNOSIS — M545 Low back pain, unspecified: Secondary | ICD-10-CM

## 2017-06-28 ENCOUNTER — Ambulatory Visit (INDEPENDENT_AMBULATORY_CARE_PROVIDER_SITE_OTHER): Payer: BLUE CROSS/BLUE SHIELD | Admitting: Nurse Practitioner

## 2017-06-28 ENCOUNTER — Encounter: Payer: Self-pay | Admitting: Nurse Practitioner

## 2017-06-28 VITALS — BP 134/83 | HR 67 | Temp 97.3°F | Ht 62.0 in | Wt 180.0 lb

## 2017-06-28 DIAGNOSIS — I10 Essential (primary) hypertension: Secondary | ICD-10-CM | POA: Diagnosis not present

## 2017-06-28 DIAGNOSIS — K219 Gastro-esophageal reflux disease without esophagitis: Secondary | ICD-10-CM | POA: Diagnosis not present

## 2017-06-28 DIAGNOSIS — E785 Hyperlipidemia, unspecified: Secondary | ICD-10-CM

## 2017-06-28 DIAGNOSIS — Z6833 Body mass index (BMI) 33.0-33.9, adult: Secondary | ICD-10-CM | POA: Diagnosis not present

## 2017-06-28 DIAGNOSIS — E119 Type 2 diabetes mellitus without complications: Secondary | ICD-10-CM | POA: Diagnosis not present

## 2017-06-28 DIAGNOSIS — E559 Vitamin D deficiency, unspecified: Secondary | ICD-10-CM | POA: Diagnosis not present

## 2017-06-28 DIAGNOSIS — R200 Anesthesia of skin: Secondary | ICD-10-CM | POA: Diagnosis not present

## 2017-06-28 DIAGNOSIS — M8580 Other specified disorders of bone density and structure, unspecified site: Secondary | ICD-10-CM

## 2017-06-28 DIAGNOSIS — H5440 Blindness, one eye, unspecified eye: Secondary | ICD-10-CM

## 2017-06-28 LAB — BAYER DCA HB A1C WAIVED: HB A1C: 6.7 % (ref <7.0)

## 2017-06-28 MED ORDER — ROSUVASTATIN CALCIUM 10 MG PO TABS: 10.0000 mg | ORAL_TABLET | Freq: Every day | ORAL | 1 refills | Status: DC

## 2017-06-28 MED ORDER — ESOMEPRAZOLE MAGNESIUM 40 MG PO CPDR: 40.0000 mg | DELAYED_RELEASE_CAPSULE | Freq: Every day | ORAL | 1 refills | Status: DC | PRN

## 2017-06-28 MED ORDER — METFORMIN HCL 500 MG PO TABS: ORAL_TABLET | ORAL | 1 refills | Status: DC

## 2017-06-28 NOTE — Patient Instructions (Signed)
Carpal Tunnel Syndrome Carpal tunnel syndrome is a condition that causes pain in your hand and arm. The carpal tunnel is a narrow area that is on the palm side of your wrist. Repeated wrist motion or certain diseases may cause swelling in the tunnel. This swelling can pinch the main nerve in the wrist (median nerve). Follow these instructions at home: If you have a splint:  Wear it as told by your doctor. Remove it only as told by your doctor.  Loosen the splint if your fingers: ? Become numb and tingle. ? Turn blue and cold.  Keep the splint clean and dry. General instructions  Take over-the-counter and prescription medicines only as told by your doctor.  Rest your wrist from any activity that may be causing your pain. If needed, talk to your employer about changes that can be made in your work, such as getting a wrist pad to use while typing.  If directed, apply ice to the painful area: ? Put ice in a plastic bag. ? Place a towel between your skin and the bag. ? Leave the ice on for 20 minutes, 2-3 times per day.  Keep all follow-up visits as told by your doctor. This is important.  Do any exercises as told by your doctor, physical therapist, or occupational therapist. Contact a doctor if:  You have new symptoms.  Medicine does not help your pain.  Your symptoms get worse. This information is not intended to replace advice given to you by your health care provider. Make sure you discuss any questions you have with your health care provider. Document Released: 12/02/2011 Document Revised: 05/20/2016 Document Reviewed: 04/30/2015 Elsevier Interactive Patient Education  2018 Elsevier Inc.  

## 2017-06-28 NOTE — Progress Notes (Signed)
Subjective:    Patient ID: Katherine Ryan, female    DOB: [birth year], 61 y.o.   MRN: [id number]  HPI Katherine Ryan is here today for follow up of chronic medical problem.  Outpatient Encounter Prescriptions as of 06/28/2017  Medication Sig  . Cholecalciferol (VITAMIN D) 2000 units CAPS Take 1 capsule (2,000 Units total) by mouth daily.  . cyclobenzaprine (FLEXERIL) 5 MG tablet TAKE ONE TABLET BY MOUTH THREE TIMES DAILY AS NEEDED FOR MUSCLE SPASM  . esomeprazole (NEXIUM) 40 MG capsule Take 1 capsule (40 mg total) by mouth daily as needed.  Marland Kitchen ibuprofen (ADVIL,MOTRIN) 200 MG tablet Take 200 mg by mouth every 6 (six) hours as needed.  Marland Kitchen losartan (COZAAR) 50 MG tablet Take 1 tablet (50 mg total) by mouth daily.  . metFORMIN (GLUCOPHAGE) 500 MG tablet TAKE ONE TABLET BY MOUTH ONCE DAILY WITH  BREAKFAST  . rosuvastatin (CRESTOR) 10 MG tablet Take 1 tablet (10 mg total) by mouth daily.   No facility-administered encounter medications on file as of 06/28/2017.     1. Type 2 diabetes mellitus without complication, without long-term current use of insulin ([place])  Patient taking metformin for blood glucose management.    2. Essential hypertension  Managed with losartan.    3. Hyperlipidemia with target LDL less than 100  Managed with rosuvastatin.  4. Gastroesophageal reflux disease without esophagitis  Symptoms management with esomeprazole.  Symptoms under control at this time.  5. Osteopenia, unspecified location  Weight-bearing exercise   6. Vitamin D deficiency  Vitamin D supplementation by mouth.  7. BMI 33.0-33.9,adult  No recent weight gain or loss.  8. Blind right eye  No change.    New complaints: Patient states she is losing feeling in her right fingers from work-induced carpal tunnel.  She is interested in being for surgery to repair.    Review of Systems  Constitutional: Negative for activity change, appetite change and fatigue.  HENT: Negative.   Respiratory: Negative for  shortness of breath and wheezing.   Cardiovascular: Negative for chest pain and palpitations.  Gastrointestinal: Negative for abdominal distention and abdominal pain.  Neurological: Negative for dizziness and headaches.  All other systems reviewed and are negative.      Objective:   Physical Exam  Constitutional: She is oriented to person, place, and time. She appears well-developed and well-nourished. No distress.  HENT:  Head: Normocephalic.  Right Ear: External ear normal.  Left Ear: External ear normal.  Mouth/Throat: Oropharynx is clear and moist.  Eyes: Pupils are equal, round, and reactive to light.  Neck: Normal range of motion. Neck supple. No JVD present. No thyromegaly present.  Cardiovascular: Normal rate, regular rhythm, normal heart sounds and intact distal pulses.   No murmur heard. Pulmonary/Chest: Effort normal and breath sounds normal. No respiratory distress. She has no wheezes.  Abdominal: Soft. Bowel sounds are normal. She exhibits no distension. There is no tenderness.  Musculoskeletal: Normal range of motion.  Lymphadenopathy:    She has no cervical adenopathy.  Neurological: She is alert and oriented to person, place, and time.  Skin: Skin is warm and dry.  Psychiatric: She has a normal mood and affect. Her behavior is normal. Judgment and thought content normal.   BP 134/83   Pulse 67   Temp (!) 97.3 F (36.3 C) (Oral)   Ht _0  (1.575 m)   Wt 180 lb (81.6 kg)   LMP 04/04/2005   BMI 32.92 kg/m  A1C: 6.7%  Assessment & Plan:  1. Type 2 diabetes mellitus without complication, without long-term current use of insulin ([facility]) Continue to watch carbs in diet - Bayer DCA Hb A1c Waived - metFORMIN (GLUCOPHAGE) 500 MG tablet; TAKE ONE TABLET BY MOUTH ONCE DAILY WITH  BREAKFAST  Dispense: 90 tablet; Refill: 1  2. Essential hypertension Low sodium diet - CMP14+EGFR  3. Hyperlipidemia with target LDL less than 100 Low fta diet - Lipid panel -  rosuvastatin (CRESTOR) 10 MG tablet; Take 1 tablet (10 mg total) by mouth daily.  Dispense: 90 tablet; Refill: 1  4. Gastroesophageal reflux disease without esophagitis Avoid spicy foods Do not eat 2 hours prior to bedtime - esomeprazole (NEXIUM) 40 MG capsule; Take 1 capsule (40 mg total) by mouth daily as needed.  Dispense: 90 capsule; Refill: 1  5. Osteopenia, unspecified location Weight bearing exercises  6. Vitamin D deficiency  7. BMI 33.0-33.9,adult Discussed diet and exercise for person with BMI >25 Will recheck weight in 3-6 months   8. Blind right eye  9. Finger numbness motirn otc as needed for pain - Ambulatory referral to Orthopedic Surgery    Labs pending Health maintenance reviewed Diet and exercise encouraged Continue all meds Follow up  In 3 months   [place], FNP

## 2017-06-29 LAB — CMP14+EGFR
A/G RATIO: 1.5 (ref 1.2–2.2)
ALT: 62 IU/L — AB (ref 0–32)
AST: 31 IU/L (ref 0–40)
Albumin: 4.4 g/dL (ref 3.6–4.8)
Alkaline Phosphatase: 64 IU/L (ref 39–117)
BUN / CREAT RATIO: 14 (ref 12–28)
BUN: 10 mg/dL (ref 8–27)
Bilirubin Total: 0.2 mg/dL (ref 0.0–1.2)
CHLORIDE: 102 mmol/L (ref 96–106)
CO2: 24 mmol/L (ref 20–29)
Calcium: 9.9 mg/dL (ref 8.7–10.3)
Creatinine, Ser: 0.72 mg/dL (ref 0.57–1.00)
GFR calc non Af Amer: 91 mL/min/{1.73_m2} (ref >59)
GFR, EST AFRICAN AMERICAN: 105 mL/min/{1.73_m2} (ref >59)
Globulin, Total: 2.9 g/dL (ref 1.5–4.5)
Glucose: 121 mg/dL — ABNORMAL HIGH (ref 65–99)
POTASSIUM: 5 mmol/L (ref 3.5–5.2)
Sodium: 141 mmol/L (ref 134–144)
TOTAL PROTEIN: 7.3 g/dL (ref 6.0–8.5)

## 2017-06-29 LAB — LIPID PANEL
Chol/HDL Ratio: 2.5 ratio (ref 0.0–4.4)
Cholesterol, Total: 165 mg/dL (ref 100–199)
HDL: 67 mg/dL (ref >39)
LDL Calculated: 81 mg/dL (ref 0–99)
Triglycerides: 85 mg/dL (ref 0–149)
VLDL Cholesterol Cal: 17 mg/dL (ref 5–40)

## 2017-07-21 DIAGNOSIS — G5601 Carpal tunnel syndrome, right upper limb: Secondary | ICD-10-CM | POA: Diagnosis not present

## 2017-08-08 DIAGNOSIS — G5601 Carpal tunnel syndrome, right upper limb: Secondary | ICD-10-CM | POA: Diagnosis not present

## 2017-08-10 DIAGNOSIS — H18421 Band keratopathy, right eye: Secondary | ICD-10-CM | POA: Diagnosis not present

## 2017-08-10 DIAGNOSIS — Z7984 Long term (current) use of oral hypoglycemic drugs: Secondary | ICD-10-CM | POA: Diagnosis not present

## 2017-08-10 DIAGNOSIS — H179 Unspecified corneal scar and opacity: Secondary | ICD-10-CM | POA: Diagnosis not present

## 2017-08-10 DIAGNOSIS — H2512 Age-related nuclear cataract, left eye: Secondary | ICD-10-CM | POA: Diagnosis not present

## 2017-08-18 DIAGNOSIS — G5601 Carpal tunnel syndrome, right upper limb: Secondary | ICD-10-CM | POA: Diagnosis not present

## 2017-08-18 DIAGNOSIS — G5602 Carpal tunnel syndrome, left upper limb: Secondary | ICD-10-CM | POA: Diagnosis not present

## 2017-08-28 ENCOUNTER — Other Ambulatory Visit: Payer: Self-pay | Admitting: Nurse Practitioner

## 2017-08-28 DIAGNOSIS — M545 Low back pain, unspecified: Secondary | ICD-10-CM

## 2017-09-14 DIAGNOSIS — G5601 Carpal tunnel syndrome, right upper limb: Secondary | ICD-10-CM | POA: Diagnosis not present

## 2017-09-27 DIAGNOSIS — G5601 Carpal tunnel syndrome, right upper limb: Secondary | ICD-10-CM | POA: Diagnosis not present

## 2017-10-07 ENCOUNTER — Encounter: Payer: Self-pay | Admitting: Nurse Practitioner

## 2017-10-07 ENCOUNTER — Ambulatory Visit (INDEPENDENT_AMBULATORY_CARE_PROVIDER_SITE_OTHER): Payer: BLUE CROSS/BLUE SHIELD | Admitting: Nurse Practitioner

## 2017-10-07 VITALS — BP 134/75 | HR 67 | Temp 97.7°F | Ht 62.0 in | Wt 183.0 lb

## 2017-10-07 DIAGNOSIS — I10 Essential (primary) hypertension: Secondary | ICD-10-CM | POA: Diagnosis not present

## 2017-10-07 DIAGNOSIS — E559 Vitamin D deficiency, unspecified: Secondary | ICD-10-CM

## 2017-10-07 DIAGNOSIS — E785 Hyperlipidemia, unspecified: Secondary | ICD-10-CM | POA: Diagnosis not present

## 2017-10-07 DIAGNOSIS — Z0000 Encounter for general adult medical examination without abnormal findings: Secondary | ICD-10-CM

## 2017-10-07 DIAGNOSIS — Z6833 Body mass index (BMI) 33.0-33.9, adult: Secondary | ICD-10-CM

## 2017-10-07 DIAGNOSIS — E119 Type 2 diabetes mellitus without complications: Secondary | ICD-10-CM

## 2017-10-07 DIAGNOSIS — K219 Gastro-esophageal reflux disease without esophagitis: Secondary | ICD-10-CM

## 2017-10-07 DIAGNOSIS — M8580 Other specified disorders of bone density and structure, unspecified site: Secondary | ICD-10-CM

## 2017-10-07 LAB — BAYER DCA HB A1C WAIVED: HB A1C (BAYER DCA - WAIVED): 6.5 % (ref <7.0)

## 2017-10-07 MED ORDER — ESOMEPRAZOLE MAGNESIUM 40 MG PO CPDR: 40.0000 mg | DELAYED_RELEASE_CAPSULE | Freq: Every day | ORAL | 1 refills | Status: DC | PRN

## 2017-10-07 MED ORDER — ROSUVASTATIN CALCIUM 10 MG PO TABS: 10.0000 mg | ORAL_TABLET | Freq: Every day | ORAL | 1 refills | Status: DC

## 2017-10-07 MED ORDER — METFORMIN HCL 500 MG PO TABS: ORAL_TABLET | ORAL | 1 refills | Status: DC

## 2017-10-07 NOTE — Patient Instructions (Signed)
Bone Health Bones protect organs, store calcium, and anchor muscles. Good health habits, such as eating nutritious foods and exercising regularly, are important for maintaining healthy bones. They can also help to prevent a condition that causes bones to lose density and become weak and brittle (osteoporosis). Why is bone mass important? Bone mass refers to the amount of bone tissue that you have. The higher your bone mass, the stronger your bones. An important step toward having healthy bones throughout life is to have strong and dense bones during childhood. A young adult who has a high bone mass is more likely to have a high bone mass later in life. Bone mass at its greatest it is called peak bone mass. A large decline in bone mass occurs in older adults. In women, it occurs about the time of menopause. During this time, it is important to practice good health habits, because if more bone is lost than what is replaced, the bones will become less healthy and more likely to break (fracture). If you find that you have a low bone mass, you may be able to prevent osteoporosis or further bone loss by changing your diet and lifestyle. How can I find out if my bone mass is low? Bone mass can be measured with an X-ray test that is called a bone mineral density (BMD) test. This test is recommended for all women who are age 65 or older. It may also be recommended for men who are age 70 or older, or for people who are more likely to develop osteoporosis due to:  Having bones that break easily.  Having a long-term disease that weakens bones, such as kidney disease or rheumatoid arthritis.  Having menopause earlier than normal.  Taking medicine that weakens bones, such as steroids, thyroid hormones, or hormone treatment for breast cancer or prostate cancer.  Smoking.  Drinking three or more alcoholic drinks each day.  What are the nutritional recommendations for healthy bones? To have healthy bones, you  need to get enough of the right minerals and vitamins. Most nutrition experts recommend getting these nutrients from the foods that you eat. Nutritional recommendations vary from person to person. Ask your health care provider what is healthy for you. Here are some general guidelines. Calcium Recommendations Calcium is the most important (essential) mineral for bone health. Most people can get enough calcium from their diet, but supplements may be recommended for people who are at risk for osteoporosis. Good sources of calcium include:  Dairy products, such as low-fat or nonfat milk, cheese, and yogurt.  Dark green leafy vegetables, such as bok choy and broccoli.  Calcium-fortified foods, such as orange juice, cereal, bread, soy beverages, and tofu products.  Nuts, such as almonds.  Follow these recommended amounts for daily calcium intake:  Children, age 1?3: 700 mg.  Children, age 4?8: 1,000 mg.  Children, age 9?13: 1,300 mg.  Teens, age 14?18: 1,300 mg.  Adults, age 19?50: 1,000 mg.  Adults, age 51?70: ? Men: 1,000 mg. ? Women: 1,200 mg.  Adults, age 71 or older: 1,200 mg.  Pregnant and breastfeeding females: ? Teens: 1,300 mg. ? Adults: 1,000 mg.  Vitamin D Recommendations Vitamin D is the most essential vitamin for bone health. It helps the body to absorb calcium. Sunlight stimulates the skin to make vitamin D, so be sure to get enough sunlight. If you live in a cold climate or you do not get outside often, your health care provider may recommend that you take vitamin   D supplements. Good sources of vitamin D in your diet include:  Egg yolks.  Saltwater fish.  Milk and cereal fortified with vitamin D.  Follow these recommended amounts for daily vitamin D intake:  Children and teens, age 1?18: 600 international units.  Adults, age 50 or younger: 400-800 international units.  Adults, age 51 or older: 800-1,000 international units.  Other Nutrients Other nutrients  for bone health include:  Phosphorus. This mineral is found in meat, poultry, dairy foods, nuts, and legumes. The recommended daily intake for adult men and adult women is 700 mg.  Magnesium. This mineral is found in seeds, nuts, dark green vegetables, and legumes. The recommended daily intake for adult men is 400?420 mg. For adult women, it is 310?320 mg.  Vitamin K. This vitamin is found in green leafy vegetables. The recommended daily intake is 120 mg for adult men and 90 mg for adult women.  What type of physical activity is best for building and maintaining healthy bones? Weight-bearing and strength-building activities are important for building and maintaining peak bone mass. Weight-bearing activities cause muscles and bones to work against gravity. Strength-building activities increases muscle strength that supports bones. Weight-bearing and muscle-building activities include:  Walking and hiking.  Jogging and running.  Dancing.  Gym exercises.  Lifting weights.  Tennis and racquetball.  Climbing stairs.  Aerobics.  Adults should get at least 30 minutes of moderate physical activity on most days. Children should get at least 60 minutes of moderate physical activity on most days. Ask your health care provide what type of exercise is best for you. Where can I find more information? For more information, check out the following websites:  National Osteoporosis Foundation: http://nof.org/learn/basics  National Institutes of Health: http://www.niams.nih.gov/Health_Info/Bone/Bone_Health/bone_health_for_life.asp  This information is not intended to replace advice given to you by your health care provider. Make sure you discuss any questions you have with your health care provider. Document Released: 03/04/2004 Document Revised: 07/02/2016 Document Reviewed: 12/18/2014 Elsevier Interactive Patient Education  2018 Elsevier Inc.  

## 2017-10-07 NOTE — Progress Notes (Signed)
Subjective:    Patient ID: Katherine Ryan, female    DOB: 1956/05/01, 61 y.o.   MRN: [id number]  HPI   Katherine Ryan is here today for follow up of chronic medical problem.  Outpatient Encounter Prescriptions as of 10/07/2017  Medication Sig  . Cholecalciferol (VITAMIN D) 2000 units CAPS Take 1 capsule (2,000 Units total) by mouth daily.  . cyclobenzaprine (FLEXERIL) 5 MG tablet TAKE 1 TABLET BY MOUTH THREE TIMES DAILY AS NEEDED FOR MUSCLE SPASM  . esomeprazole (NEXIUM) 40 MG capsule Take 1 capsule (40 mg total) by mouth daily as needed.  Marland Kitchen ibuprofen (ADVIL,MOTRIN) 200 MG tablet Take 200 mg by mouth every 6 (six) hours as needed.  Marland Kitchen losartan (COZAAR) 50 MG tablet Take 1 tablet (50 mg total) by mouth daily.  . metFORMIN (GLUCOPHAGE) 500 MG tablet TAKE ONE TABLET BY MOUTH ONCE DAILY WITH  BREAKFAST  . rosuvastatin (CRESTOR) 10 MG tablet Take 1 tablet (10 mg total) by mouth daily.   No facility-administered encounter medications on file as of 10/07/2017.     1. Annual physical exam   2. Essential hypertension  No c/o chest pain, sob or headache. Does not check blood pressures at home. BP Readings from Last 3 Encounters:  06/28/17 134/83  03/17/17 132/69  12/14/16 132/76     3. Gastroesophageal reflux disease without esophagitis  Takes nexium daily- works well for her  4. Type 2 diabetes mellitus without complication, without long-term current use of insulin ([place]) patient does not check blood sugars every day. No hypoglycemic episodes. Lab Results  Component Value Date   HGBA1C 6.5 02/10/2016     5. Osteopenia, unspecified location  No c/o back pain. Needs dexa scheduled  6. Vitamin D deficiency  Takes vitamin d daily  7. Hyperlipidemia with target LDL less than 100  Does not watch diet very closely  8. BMI 33.0-33.9,adult  No recent weight changes    New complaints: None today  Social history: still working- husband retired and stays home.   Review of Systems    Constitutional: Negative for activity change and appetite change.  HENT: Negative.   Eyes: Negative for pain.  Respiratory: Negative for shortness of breath.   Cardiovascular: Negative for chest pain, palpitations and leg swelling.  Gastrointestinal: Negative for abdominal pain.  Endocrine: Negative for polydipsia.  Genitourinary: Negative.   Skin: Negative for rash.  Neurological: Negative for dizziness, weakness and headaches.  Hematological: Does not bruise/bleed easily.  Psychiatric/Behavioral: Negative.   All other systems reviewed and are negative.      Objective:   Physical Exam  Constitutional: She is oriented to person, place, and time. She appears well-developed and well-nourished.  HENT:  Nose: Nose normal.  Mouth/Throat: Oropharynx is clear and moist.  Eyes: EOM are normal.    Scarring right tpupil  Neck: Trachea normal, normal range of motion and full passive range of motion without pain. Neck supple. No JVD present. Carotid bruit is not present. No thyromegaly present.  Cardiovascular: Normal rate, regular rhythm, normal heart sounds and intact distal pulses.  Exam reveals no gallop and no friction rub.   No murmur heard. Pulmonary/Chest: Effort normal and breath sounds normal. Right breast exhibits no inverted nipple, no mass, no nipple discharge, no skin change and no tenderness. Left breast exhibits no inverted nipple, no mass, no nipple discharge, no skin change and no tenderness.  Abdominal: Soft. Bowel sounds are normal. She exhibits no distension and no mass. There is no tenderness.  Musculoskeletal: Normal range of motion.  Lymphadenopathy:    She has no cervical adenopathy.  Neurological: She is alert and oriented to person, place, and time. She has normal reflexes.  Skin: Skin is warm and dry.  Psychiatric: She has a normal mood and affect. Her behavior is normal. Judgment and thought content normal.   BP 134/75   Pulse 67   Temp 97.7 F (36.5 C)  (Oral)   Ht _0  (1.575 m)   Wt 183 lb (83 kg)   LMP 04/04/2005   BMI 33.47 kg/m   hba1c 6.5%       Assessment & Plan:  1. Annual physical exam  2. Essential hypertension Low sodium diet - CMP14+EGFR  3. Gastroesophageal reflux disease without esophagitis Avoid spicy foods Do not eat 2 hours prior to bedtime - esomeprazole (NEXIUM) 40 MG capsule; Take 1 capsule (40 mg total) by mouth daily as needed.  Dispense: 90 capsule; Refill: 1  4. Type 2 diabetes mellitus without complication, without long-term current use of insulin ([facility]) Continue to watch carbsin diet - Bayer DCA Hb A1c Waived - metFORMIN (GLUCOPHAGE) 500 MG tablet; TAKE ONE TABLET BY MOUTH ONCE DAILY WITH  BREAKFAST  Dispense: 90 tablet; Refill: 1  5. Osteopenia, unspecified location Weight bearing exercises encouraged  6. Vitamin D deficiency - VITAMIN D 25 Hydroxy (Vit-D Deficiency, Fractures)  7. Hyperlipidemia with target LDL less than 100 Low fat diet - Lipid panel - rosuvastatin (CRESTOR) 10 MG tablet; Take 1 tablet (10 mg total) by mouth daily.  Dispense: 90 tablet; Refill: 1  8. BMI 33.0-33.9,adult Discussed diet and exercise for person with BMI >25 Will recheck weight in 3-6 months   Has eye exam in 1 week Labs pending Health maintenance reviewed Diet and exercise encouraged Continue all meds Follow up  In 3 months   [place], FNP

## 2017-10-07 NOTE — Addendum Note (Signed)
Addended by: Chevis Pretty on: 10/07/2017 11:50 AM   Modules accepted: Level of Service

## 2017-10-07 NOTE — Addendum Note (Signed)
Addended by: Chevis Pretty on: 10/07/2017 11:44 AM   Modules accepted: Orders

## 2017-10-09 LAB — CMP14+EGFR
ALBUMIN: 4.8 g/dL (ref 3.6–4.8)
ALK PHOS: 61 IU/L (ref 39–117)
ALT: 53 IU/L — ABNORMAL HIGH (ref 0–32)
AST: 26 IU/L (ref 0–40)
Albumin/Globulin Ratio: 1.7 (ref 1.2–2.2)
BUN / CREAT RATIO: 23 (ref 12–28)
BUN: 15 mg/dL (ref 8–27)
Bilirubin Total: 0.3 mg/dL (ref 0.0–1.2)
CALCIUM: 10.2 mg/dL (ref 8.7–10.3)
CO2: 23 mmol/L (ref 20–29)
CREATININE: 0.65 mg/dL (ref 0.57–1.00)
Chloride: 96 mmol/L (ref 96–106)
GFR, EST AFRICAN AMERICAN: 111 mL/min/{1.73_m2} (ref >59)
GFR, EST NON AFRICAN AMERICAN: 96 mL/min/{1.73_m2} (ref >59)
GLOBULIN, TOTAL: 2.8 g/dL (ref 1.5–4.5)
Glucose: 112 mg/dL — ABNORMAL HIGH (ref 65–99)
Potassium: 4.7 mmol/L (ref 3.5–5.2)
SODIUM: 139 mmol/L (ref 134–144)
TOTAL PROTEIN: 7.6 g/dL (ref 6.0–8.5)

## 2017-10-09 LAB — CBC WITH DIFFERENTIAL/PLATELET
BASOS: 0 %
Basophils Absolute: 0 10*3/uL (ref 0.0–0.2)
EOS (ABSOLUTE): 0.1 10*3/uL (ref 0.0–0.4)
EOS: 1 %
HEMATOCRIT: 41.6 % (ref 34.0–46.6)
HEMOGLOBIN: 13.9 g/dL (ref 11.1–15.9)
Immature Grans (Abs): 0 10*3/uL (ref 0.0–0.1)
Immature Granulocytes: 0 %
LYMPHS ABS: 1.7 10*3/uL (ref 0.7–3.1)
Lymphs: 29 %
MCH: 30.3 pg (ref 26.6–33.0)
MCHC: 33.4 g/dL (ref 31.5–35.7)
MCV: 91 fL (ref 79–97)
MONOCYTES: 8 %
Monocytes Absolute: 0.4 10*3/uL (ref 0.1–0.9)
NEUTROS ABS: 3.6 10*3/uL (ref 1.4–7.0)
Neutrophils: 62 %
Platelets: 225 10*3/uL (ref 150–379)
RBC: 4.59 x10E6/uL (ref 3.77–5.28)
RDW: 14.3 % (ref 12.3–15.4)
WBC: 5.8 10*3/uL (ref 3.4–10.8)

## 2017-10-09 LAB — LIPID PANEL
CHOL/HDL RATIO: 2.6 ratio (ref 0.0–4.4)
Cholesterol, Total: 172 mg/dL (ref 100–199)
HDL: 65 mg/dL (ref >39)
LDL CALC: 78 mg/dL (ref 0–99)
Triglycerides: 143 mg/dL (ref 0–149)
VLDL Cholesterol Cal: 29 mg/dL (ref 5–40)

## 2017-10-09 LAB — THYROID PANEL WITH TSH
Free Thyroxine Index: 1.7 (ref 1.2–4.9)
T3 Uptake Ratio: 21 % — ABNORMAL LOW (ref 24–39)
T4 TOTAL: 8 ug/dL (ref 4.5–12.0)
TSH: 2.25 u[IU]/mL (ref 0.450–4.500)

## 2017-10-09 LAB — VITAMIN D 25 HYDROXY (VIT D DEFICIENCY, FRACTURES): VIT D 25 HYDROXY: 34.2 ng/mL (ref 30.0–100.0)

## 2017-10-21 DIAGNOSIS — H15051 Scleromalacia perforans, right eye: Secondary | ICD-10-CM | POA: Diagnosis not present

## 2017-10-21 DIAGNOSIS — H541141 Blindness right eye category 4, low vision left eye category 1: Secondary | ICD-10-CM | POA: Diagnosis not present

## 2017-10-21 DIAGNOSIS — H5710 Ocular pain, unspecified eye: Secondary | ICD-10-CM | POA: Diagnosis not present

## 2017-10-21 DIAGNOSIS — H5461 Unqualified visual loss, right eye, normal vision left eye: Secondary | ICD-10-CM | POA: Diagnosis not present

## 2017-10-21 DIAGNOSIS — Z87828 Personal history of other (healed) physical injury and trauma: Secondary | ICD-10-CM | POA: Diagnosis not present

## 2017-10-21 DIAGNOSIS — H1701 Adherent leukoma, right eye: Secondary | ICD-10-CM | POA: Diagnosis not present

## 2017-10-21 DIAGNOSIS — H5711 Ocular pain, right eye: Secondary | ICD-10-CM | POA: Diagnosis not present

## 2017-11-03 DIAGNOSIS — M1990 Unspecified osteoarthritis, unspecified site: Secondary | ICD-10-CM | POA: Diagnosis not present

## 2017-11-03 DIAGNOSIS — H1589 Other disorders of sclera: Secondary | ICD-10-CM | POA: Diagnosis not present

## 2017-11-03 DIAGNOSIS — H541141 Blindness right eye category 4, low vision left eye category 1: Secondary | ICD-10-CM | POA: Diagnosis not present

## 2017-11-03 DIAGNOSIS — Z87891 Personal history of nicotine dependence: Secondary | ICD-10-CM | POA: Diagnosis not present

## 2017-11-03 DIAGNOSIS — H5711 Ocular pain, right eye: Secondary | ICD-10-CM | POA: Diagnosis not present

## 2017-11-03 DIAGNOSIS — Z888 Allergy status to other drugs, medicaments and biological substances status: Secondary | ICD-10-CM | POA: Diagnosis not present

## 2017-11-03 DIAGNOSIS — Z886 Allergy status to analgesic agent status: Secondary | ICD-10-CM | POA: Diagnosis not present

## 2017-11-03 DIAGNOSIS — H42 Glaucoma in diseases classified elsewhere: Secondary | ICD-10-CM | POA: Diagnosis not present

## 2017-11-03 DIAGNOSIS — Z79899 Other long term (current) drug therapy: Secondary | ICD-10-CM | POA: Diagnosis not present

## 2017-11-03 DIAGNOSIS — H409 Unspecified glaucoma: Secondary | ICD-10-CM | POA: Diagnosis not present

## 2017-11-03 DIAGNOSIS — E785 Hyperlipidemia, unspecified: Secondary | ICD-10-CM | POA: Diagnosis not present

## 2017-11-03 DIAGNOSIS — H5460 Unqualified visual loss, one eye, unspecified: Secondary | ICD-10-CM | POA: Diagnosis not present

## 2017-11-03 DIAGNOSIS — E1136 Type 2 diabetes mellitus with diabetic cataract: Secondary | ICD-10-CM | POA: Diagnosis not present

## 2017-11-03 DIAGNOSIS — Z7984 Long term (current) use of oral hypoglycemic drugs: Secondary | ICD-10-CM | POA: Diagnosis not present

## 2017-11-03 DIAGNOSIS — H16401 Unspecified corneal neovascularization, right eye: Secondary | ICD-10-CM | POA: Diagnosis not present

## 2017-11-03 DIAGNOSIS — E1139 Type 2 diabetes mellitus with other diabetic ophthalmic complication: Secondary | ICD-10-CM | POA: Diagnosis not present

## 2017-11-03 DIAGNOSIS — K219 Gastro-esophageal reflux disease without esophagitis: Secondary | ICD-10-CM | POA: Diagnosis not present

## 2017-11-03 DIAGNOSIS — H5440 Blindness, one eye, unspecified eye: Secondary | ICD-10-CM | POA: Diagnosis not present

## 2017-11-03 DIAGNOSIS — I10 Essential (primary) hypertension: Secondary | ICD-10-CM | POA: Diagnosis not present

## 2017-11-03 DIAGNOSIS — H0510 Unspecified chronic inflammatory disorders of orbit: Secondary | ICD-10-CM | POA: Diagnosis not present

## 2017-12-08 ENCOUNTER — Other Ambulatory Visit: Payer: Self-pay | Admitting: Nurse Practitioner

## 2017-12-08 DIAGNOSIS — M545 Low back pain, unspecified: Secondary | ICD-10-CM

## 2018-01-10 ENCOUNTER — Encounter: Payer: Self-pay | Admitting: Nurse Practitioner

## 2018-01-10 ENCOUNTER — Ambulatory Visit: Payer: BLUE CROSS/BLUE SHIELD | Admitting: Nurse Practitioner

## 2018-01-10 VITALS — BP 143/78 | HR 72 | Temp 97.5°F | Ht 62.0 in | Wt 192.0 lb

## 2018-01-10 DIAGNOSIS — H5440 Blindness, one eye, unspecified eye: Secondary | ICD-10-CM

## 2018-01-10 DIAGNOSIS — E119 Type 2 diabetes mellitus without complications: Secondary | ICD-10-CM | POA: Diagnosis not present

## 2018-01-10 DIAGNOSIS — E785 Hyperlipidemia, unspecified: Secondary | ICD-10-CM

## 2018-01-10 DIAGNOSIS — K219 Gastro-esophageal reflux disease without esophagitis: Secondary | ICD-10-CM

## 2018-01-10 DIAGNOSIS — Z6833 Body mass index (BMI) 33.0-33.9, adult: Secondary | ICD-10-CM | POA: Diagnosis not present

## 2018-01-10 DIAGNOSIS — E559 Vitamin D deficiency, unspecified: Secondary | ICD-10-CM | POA: Diagnosis not present

## 2018-01-10 DIAGNOSIS — I10 Essential (primary) hypertension: Secondary | ICD-10-CM | POA: Diagnosis not present

## 2018-01-10 LAB — LIPID PANEL
CHOLESTEROL TOTAL: 165 mg/dL (ref 100–199)
Chol/HDL Ratio: 2.5 ratio (ref 0.0–4.4)
HDL: 67 mg/dL (ref >39)
LDL CALC: 78 mg/dL (ref 0–99)
TRIGLYCERIDES: 98 mg/dL (ref 0–149)
VLDL CHOLESTEROL CAL: 20 mg/dL (ref 5–40)

## 2018-01-10 LAB — CMP14+EGFR
ALT: 51 IU/L — AB (ref 0–32)
AST: 31 IU/L (ref 0–40)
Albumin/Globulin Ratio: 1.6 (ref 1.2–2.2)
Albumin: 4.6 g/dL (ref 3.6–4.8)
Alkaline Phosphatase: 70 IU/L (ref 39–117)
BUN/Creatinine Ratio: 15 (ref 12–28)
BUN: 10 mg/dL (ref 8–27)
Bilirubin Total: 0.2 mg/dL (ref 0.0–1.2)
CALCIUM: 10.4 mg/dL — AB (ref 8.7–10.3)
CO2: 24 mmol/L (ref 20–29)
CREATININE: 0.67 mg/dL (ref 0.57–1.00)
Chloride: 101 mmol/L (ref 96–106)
GFR calc Af Amer: 110 mL/min/{1.73_m2} (ref >59)
GFR calc non Af Amer: 95 mL/min/{1.73_m2} (ref >59)
GLOBULIN, TOTAL: 2.9 g/dL (ref 1.5–4.5)
GLUCOSE: 115 mg/dL — AB (ref 65–99)
Potassium: 5 mmol/L (ref 3.5–5.2)
Sodium: 142 mmol/L (ref 134–144)
Total Protein: 7.5 g/dL (ref 6.0–8.5)

## 2018-01-10 LAB — BAYER DCA HB A1C WAIVED: HB A1C (BAYER DCA - WAIVED): 6.7 % (ref <7.0)

## 2018-01-10 NOTE — Progress Notes (Signed)
Subjective:    Patient ID: Katherine Ryan, female    DOB: 18-Mar-1956, 62 y.o.   MRN: [id number]  HPI  Katherine Ryan is here today for follow up of chronic medical problem.  Outpatient Encounter Medications as of 01/10/2018  Medication Sig  . Cholecalciferol (VITAMIN D) 2000 units CAPS Take 1 capsule (2,000 Units total) by mouth daily.  . cyclobenzaprine (FLEXERIL) 5 MG tablet TAKE 1 TABLET BY MOUTH THREE TIMES DAILY AS NEEDED FOR MUSCLE SPASMS  . esomeprazole (NEXIUM) 40 MG capsule Take 1 capsule (40 mg total) by mouth daily as needed.  Marland Kitchen ibuprofen (ADVIL,MOTRIN) 200 MG tablet Take 200 mg by mouth every 6 (six) hours as needed.  Marland Kitchen losartan (COZAAR) 50 MG tablet Take 1 tablet (50 mg total) by mouth daily.  . metFORMIN (GLUCOPHAGE) 500 MG tablet TAKE ONE TABLET BY MOUTH ONCE DAILY WITH  BREAKFAST  . pyridOXINE (VITAMIN B-6) 100 MG tablet Take 100 mg by mouth daily.  . rosuvastatin (CRESTOR) 10 MG tablet Take 1 tablet (10 mg total) by mouth daily.     1. Essential hypertension  No c/o chest pain, sob or headache. Does not check blood pressures at home. BP Readings from Last 3 Encounters:  10/07/17 134/75  06/28/17 134/83  03/17/17 132/69     2. Hyperlipidemia with target LDL less than 100  Wife tries to watch diet  3. Type 2 diabetes mellitus without complication, without long-term current use of insulin ([place]) last hgba1c was 6.5%. She watches diet. Does not exercise other than walking at work. Blood sugars are usually below 120 fasting in mornings.  4. Gastroesophageal reflux disease without esophagitis  Takes nexium dia ly to keep symptoms under control.  5. Vitamin D deficiency  Takes daily supplement  6. Blind right eye  Has been blind for many years. Had ev=ye removed about 3 months ago. Doing well. Will get artificial eye soon.  7. BMI 33.0-33.9,adult  No recent weight changes    New complaints: None today  Social history: Lives with husband who has dementia- she has  to leave him home by himself when she works. 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  HENT: Negative.   Eyes: Negative for pain.  Respiratory: Negative for shortness of breath.   Cardiovascular: Negative for chest pain, palpitations and leg swelling.  Gastrointestinal: Negative for abdominal pain.  Endocrine: Negative for polydipsia.  Genitourinary: Negative.   Skin: Negative for rash.  Neurological: Negative for dizziness, weakness and headaches.  Hematological: Does not bruise/bleed easily.  Psychiatric/Behavioral: Negative.   All other systems reviewed and are negative.      Objective:   Physical Exam  Constitutional: She is oriented to person, place, and time. She appears well-developed and well-nourished.  HENT:  Nose: Nose normal.  Mouth/Throat: Oropharynx is clear and moist.  Eyes: EOM are normal.  Neck: Trachea normal, normal range of motion and full passive range of motion without pain. Neck supple. No JVD present. Carotid bruit is not present. No thyromegaly present.  Cardiovascular: Normal rate, regular rhythm, normal heart sounds and intact distal pulses. Exam reveals no gallop and no friction rub.  No murmur heard. Pulmonary/Chest: Effort normal and breath sounds normal.  Abdominal: Soft. Bowel sounds are normal. She exhibits no distension and no mass. There is no tenderness.  Musculoskeletal: Normal range of motion.  Lymphadenopathy:    She has no cervical adenopathy.  Neurological: She  is alert and oriented to person, place, and time. She has normal reflexes.  Skin: Skin is warm and dry.  Psychiatric: She has a normal mood and affect. Her behavior is normal. Judgment and thought content normal.   LMP 04/04/2005   BP (!) 143/78   Pulse 72   Temp (!) 97.5 F (36.4 C) (Oral)   Ht '5\' 2"'  (1.575 m)   Wt 192 lb (87.1 kg)   LMP 04/04/2005   BMI 35.12 kg/m   hgba1c 6.7%       Assessment & Plan:  1. Essential hypertension Low osdium diet - CMP14+EGFR  2. Hyperlipidemia with target LDL less than 100 Low fat idet - Lipid panel  3. Type 2 diabetes mellitus without complication, without long-term current use of insulin (HCC) Continue to watch carbs in diet - Bayer DCA Hb A1c Waived  4. Gastroesophageal reflux disease without esophagitis Avoid spicy foods Do not eat 2 hours prior to bedtime  5. Vitamin D deficiency  6. Blind right eye/removed  7. BMI 33.0-33.9,adult Discussed diet and exercise for person with BMI >25 Will recheck weight in 3-6 months    Labs pending Health maintenance reviewed Diet and exercise encouraged Continue all meds Follow up  In 3 months   [place], FNP

## 2018-01-10 NOTE — Patient Instructions (Signed)

## 2018-01-31 DIAGNOSIS — Z4421 Encounter for fitting and adjustment of artificial right eye: Secondary | ICD-10-CM | POA: Diagnosis not present

## 2018-02-10 LAB — HM DIABETES EYE EXAM

## 2018-02-16 ENCOUNTER — Other Ambulatory Visit: Payer: Self-pay | Admitting: Nurse Practitioner

## 2018-02-16 DIAGNOSIS — M545 Low back pain, unspecified: Secondary | ICD-10-CM

## 2018-03-16 ENCOUNTER — Telehealth: Payer: Self-pay | Admitting: *Deleted

## 2018-03-16 MED ORDER — LOSARTAN POTASSIUM 100 MG PO TABS: 50.0000 mg | ORAL_TABLET | Freq: Every day | ORAL | 3 refills | Status: DC

## 2018-03-16 NOTE — Telephone Encounter (Signed)
Crestor was causing severe myalgias Pt d/ced medication.  No longer having muscle pain Will you change to something else

## 2018-03-16 NOTE — Telephone Encounter (Signed)
Losartan changed to 100mg  daily and patient is on ly to take 1/2 tablet

## 2018-03-16 NOTE — Telephone Encounter (Signed)
Losartan 50mg  is on backorder. Please change to 25mg  take 2 or 100mg  take 1/2. Please advise

## 2018-03-17 NOTE — Telephone Encounter (Signed)
Has she been on lipitor in past

## 2018-03-20 NOTE — Telephone Encounter (Signed)
Patient says she has not been on Lipitor before. Willing to try it

## 2018-03-21 MED ORDER — ATORVASTATIN CALCIUM 80 MG PO TABS: 80.0000 mg | ORAL_TABLET | Freq: Every day | ORAL | 3 refills | Status: DC

## 2018-04-18 ENCOUNTER — Encounter: Payer: Self-pay | Admitting: Nurse Practitioner

## 2018-04-18 ENCOUNTER — Ambulatory Visit: Payer: BLUE CROSS/BLUE SHIELD | Admitting: Nurse Practitioner

## 2018-04-18 VITALS — BP 132/76 | HR 79 | Temp 97.5°F | Ht 62.0 in | Wt 180.0 lb

## 2018-04-18 DIAGNOSIS — K219 Gastro-esophageal reflux disease without esophagitis: Secondary | ICD-10-CM

## 2018-04-18 DIAGNOSIS — M8580 Other specified disorders of bone density and structure, unspecified site: Secondary | ICD-10-CM

## 2018-04-18 DIAGNOSIS — E559 Vitamin D deficiency, unspecified: Secondary | ICD-10-CM | POA: Diagnosis not present

## 2018-04-18 DIAGNOSIS — I10 Essential (primary) hypertension: Secondary | ICD-10-CM | POA: Diagnosis not present

## 2018-04-18 DIAGNOSIS — E785 Hyperlipidemia, unspecified: Secondary | ICD-10-CM | POA: Diagnosis not present

## 2018-04-18 DIAGNOSIS — Z6833 Body mass index (BMI) 33.0-33.9, adult: Secondary | ICD-10-CM | POA: Diagnosis not present

## 2018-04-18 DIAGNOSIS — E119 Type 2 diabetes mellitus without complications: Secondary | ICD-10-CM

## 2018-04-18 LAB — CMP14+EGFR
ALT: 43 IU/L — ABNORMAL HIGH (ref 0–32)
AST: 31 IU/L (ref 0–40)
Albumin/Globulin Ratio: 1.6 (ref 1.2–2.2)
Albumin: 4.2 g/dL (ref 3.6–4.8)
Alkaline Phosphatase: 57 IU/L (ref 39–117)
BUN/Creatinine Ratio: 19 (ref 12–28)
BUN: 13 mg/dL (ref 8–27)
Bilirubin Total: 0.2 mg/dL (ref 0.0–1.2)
CALCIUM: 9.4 mg/dL (ref 8.7–10.3)
CO2: 25 mmol/L (ref 20–29)
Chloride: 99 mmol/L (ref 96–106)
Creatinine, Ser: 0.7 mg/dL (ref 0.57–1.00)
GFR, EST AFRICAN AMERICAN: 108 mL/min/{1.73_m2} (ref >59)
GFR, EST NON AFRICAN AMERICAN: 94 mL/min/{1.73_m2} (ref >59)
Globulin, Total: 2.7 g/dL (ref 1.5–4.5)
Glucose: 116 mg/dL — ABNORMAL HIGH (ref 65–99)
POTASSIUM: 4.6 mmol/L (ref 3.5–5.2)
Sodium: 138 mmol/L (ref 134–144)
TOTAL PROTEIN: 6.9 g/dL (ref 6.0–8.5)

## 2018-04-18 LAB — LIPID PANEL
CHOL/HDL RATIO: 2.5 ratio (ref 0.0–4.4)
Cholesterol, Total: 141 mg/dL (ref 100–199)
HDL: 56 mg/dL (ref >39)
LDL Calculated: 66 mg/dL (ref 0–99)
Triglycerides: 93 mg/dL (ref 0–149)
VLDL CHOLESTEROL CAL: 19 mg/dL (ref 5–40)

## 2018-04-18 LAB — BAYER DCA HB A1C WAIVED: HB A1C: 6.4 % (ref <7.0)

## 2018-04-18 MED ORDER — ATORVASTATIN CALCIUM 80 MG PO TABS: 80.0000 mg | ORAL_TABLET | Freq: Every day | ORAL | 3 refills | Status: DC

## 2018-04-18 MED ORDER — METFORMIN HCL 500 MG PO TABS: ORAL_TABLET | ORAL | 1 refills | Status: DC

## 2018-04-18 MED ORDER — LOSARTAN POTASSIUM 100 MG PO TABS: 50.0000 mg | ORAL_TABLET | Freq: Every day | ORAL | 3 refills | Status: DC

## 2018-04-18 NOTE — Patient Instructions (Signed)

## 2018-04-18 NOTE — Progress Notes (Signed)
Subjective:    Patient ID: Katherine Ryan, female    DOB: 01/26/1956, 62 y.o.   MRN: [id number]  HPI  Katherine Ryan is here today for follow up of chronic medical problem.  Outpatient Encounter Medications as of 04/18/2018  Medication Sig  . atorvastatin (LIPITOR) 80 MG tablet Take 1 tablet (80 mg total) by mouth daily.  . Cholecalciferol (VITAMIN D) 2000 units CAPS Take 1 capsule (2,000 Units total) by mouth daily.  . cyclobenzaprine (FLEXERIL) 5 MG tablet TAKE 1 TABLET BY MOUTH THREE TIMES DAILY AS NEEDED FOR MUSCLE SPASMS  . esomeprazole (NEXIUM) 40 MG capsule Take 1 capsule (40 mg total) by mouth daily as needed.  Marland Kitchen ibuprofen (ADVIL,MOTRIN) 200 MG tablet Take 200 mg by mouth every 6 (six) hours as needed.  Marland Kitchen losartan (COZAAR) 100 MG tablet Take 0.5 tablets (50 mg total) by mouth daily.  . metFORMIN (GLUCOPHAGE) 500 MG tablet TAKE ONE TABLET BY MOUTH ONCE DAILY WITH  BREAKFAST  . pyridOXINE (VITAMIN B-6) 100 MG tablet Take 100 mg by mouth daily.     1. Essential hypertension  No c/o chest pain, sob or headache. Does not check blood pressure at home. BP Readings from Last 3 Encounters:  01/10/18 (!) 143/78  10/07/17 134/75  06/28/17 134/83     2. Gastroesophageal reflux disease without esophagitis  Takes nexium daily which works well to keep symptoms under control  3. Type 2 diabetes mellitus without complication, without long-term current use of insulin ([place]) last hgba1c was 6.7%. She does not check blood sugars everyday. She does watch diet. She denies any symptoms of hypoglycemia. She says she has really ben watch diet  4. Osteopenia, unspecified location  Last dexascan was  11/28/13. T score was -1.0. She denies any back pain.  5. Vitamin D deficiency  Will check levels today  6. Hyperlipidemia with target LDL less than 100  Tries to watch diet . Does most days.  7. BMI 33.0-33.9,adult  No recent weight changes    New complaints: None  today  Social history: Lives  with her husband. She still works and he stays home by himself.  * has had right eye removed and prosthesis put in since last visit. She is dong well without any problems.  Review of Systems  Constitutional: Negative for activity change and appetite change.  HENT: Negative.   Eyes: Negative for pain.  Respiratory: Negative for shortness of breath.   Cardiovascular: Negative for chest pain, palpitations and leg swelling.  Gastrointestinal: Negative for abdominal pain.  Endocrine: Negative for polydipsia.  Genitourinary: Negative.   Skin: Negative for rash.  Neurological: Negative for dizziness, weakness and headaches.  Hematological: Does not bruise/bleed easily.  Psychiatric/Behavioral: Negative.   All other systems reviewed and are negative.      Objective:   Physical Exam  Constitutional: She is oriented to person, place, and time. She appears well-developed and well-nourished.  HENT:  Nose: Nose normal.  Mouth/Throat: Oropharynx is clear and moist.  Eyes: EOM are normal.  EOM normal in left eye. Right eye prosthesis tracks well with left eye  Neck: Trachea normal, normal range of motion and full passive range of motion without pain. Neck supple. No JVD present. Carotid bruit is not present. No thyromegaly present.  Cardiovascular: Normal rate, regular rhythm, normal heart sounds and intact distal pulses. Exam reveals no gallop and no friction rub.  No murmur heard. Pulmonary/Chest: Effort normal and breath sounds normal.  Abdominal: Soft. Bowel sounds are normal.  She exhibits no distension and no mass. There is no tenderness.  Musculoskeletal: Normal range of motion.  Lymphadenopathy:    She has no cervical adenopathy.  Neurological: She is alert and oriented to person, place, and time. She has normal reflexes.  Skin: Skin is warm and dry.  Psychiatric: She has a normal mood and affect. Her behavior is normal. Judgment and thought content normal.   BP 132/76   Pulse 79    Temp (!) 97.5 F (36.4 C) (Oral)   Ht '5\' 2"'  (1.575 m)   Wt 180 lb (81.6 kg)   LMP 04/04/2005   BMI 32.92 kg/m   hgba1c 6.4%     Assessment & Plan:  1. Essential hypertension Low sodium diet - CMP14+EGFR - losartan (COZAAR) 100 MG tablet; Take 0.5 tablets (50 mg total) by mouth daily.  Dispense: 90 tablet; Refill: 3  2. Gastroesophageal reflux disease without esophagitis Avoid spicy foods Do not eat 2 hours prior to bedtime  3. Type 2 diabetes mellitus without complication, without long-term current use of insulin (HCC) Continue to watch carbs in diet - Bayer DCA Hb A1c Waived - Microalbumin / creatinine urine ratio - metFORMIN (GLUCOPHAGE) 500 MG tablet; TAKE ONE TABLET BY MOUTH ONCE DAILY WITH  BREAKFAST  Dispense: 90 tablet; Refill: 1  4. Osteopenia, unspecified location Weight bearing exercises encouraged - DG [facility] DEXA  5. Vitamin D deficiency cntinue vitamin d daly  6. Hyperlipidemia with target LDL less than 100 Low fat diet - Lipid panel - atorvastatin (LIPITOR) 80 MG tablet; Take 1 tablet (80 mg total) by mouth daily.  Dispense: 90 tablet; Refill: 3  7. BMI 33.0-33.9,adult Discussed diet and exercise for person with BMI >25 Will recheck weight in 3-6 months    Labs pending Health maintenance reviewed Diet and exercise encouraged Continue all meds Follow up  In 3 months   [place], FNP

## 2018-04-26 ENCOUNTER — Other Ambulatory Visit: Payer: Self-pay | Admitting: Nurse Practitioner

## 2018-04-26 DIAGNOSIS — M545 Low back pain, unspecified: Secondary | ICD-10-CM

## 2018-04-26 NOTE — Telephone Encounter (Signed)
Last seen 04/18/18  MMM 

## 2018-05-01 ENCOUNTER — Ambulatory Visit (INDEPENDENT_AMBULATORY_CARE_PROVIDER_SITE_OTHER): Payer: BLUE CROSS/BLUE SHIELD

## 2018-05-01 DIAGNOSIS — M8580 Other specified disorders of bone density and structure, unspecified site: Secondary | ICD-10-CM

## 2018-05-04 DIAGNOSIS — Z780 Asymptomatic menopausal state: Secondary | ICD-10-CM | POA: Diagnosis not present

## 2018-05-04 DIAGNOSIS — M8589 Other specified disorders of bone density and structure, multiple sites: Secondary | ICD-10-CM | POA: Diagnosis not present

## 2018-06-21 ENCOUNTER — Other Ambulatory Visit: Payer: Self-pay | Admitting: Nurse Practitioner

## 2018-06-21 DIAGNOSIS — M545 Low back pain, unspecified: Secondary | ICD-10-CM

## 2018-07-25 ENCOUNTER — Ambulatory Visit: Payer: BLUE CROSS/BLUE SHIELD | Admitting: Nurse Practitioner

## 2018-07-25 ENCOUNTER — Encounter: Payer: Self-pay | Admitting: Nurse Practitioner

## 2018-07-25 VITALS — BP 142/80 | HR 63 | Temp 97.1°F | Ht 62.0 in | Wt 171.1 lb

## 2018-07-25 DIAGNOSIS — Z970 Presence of artificial eye: Secondary | ICD-10-CM

## 2018-07-25 DIAGNOSIS — I10 Essential (primary) hypertension: Secondary | ICD-10-CM

## 2018-07-25 DIAGNOSIS — E559 Vitamin D deficiency, unspecified: Secondary | ICD-10-CM | POA: Diagnosis not present

## 2018-07-25 DIAGNOSIS — K219 Gastro-esophageal reflux disease without esophagitis: Secondary | ICD-10-CM

## 2018-07-25 DIAGNOSIS — E785 Hyperlipidemia, unspecified: Secondary | ICD-10-CM

## 2018-07-25 DIAGNOSIS — Z6831 Body mass index (BMI) 31.0-31.9, adult: Secondary | ICD-10-CM

## 2018-07-25 DIAGNOSIS — E119 Type 2 diabetes mellitus without complications: Secondary | ICD-10-CM | POA: Diagnosis not present

## 2018-07-25 LAB — CMP14+EGFR
A/G RATIO: 1.6 (ref 1.2–2.2)
ALT: 29 IU/L (ref 0–32)
AST: 19 IU/L (ref 0–40)
Albumin: 4.5 g/dL (ref 3.6–4.8)
Alkaline Phosphatase: 59 IU/L (ref 39–117)
BUN/Creatinine Ratio: 20 (ref 12–28)
BUN: 14 mg/dL (ref 8–27)
Bilirubin Total: <0.2 mg/dL (ref 0.0–1.2)
CALCIUM: 9.7 mg/dL (ref 8.7–10.3)
CO2: 26 mmol/L (ref 20–29)
Chloride: 100 mmol/L (ref 96–106)
Creatinine, Ser: 0.7 mg/dL (ref 0.57–1.00)
GFR, EST AFRICAN AMERICAN: 108 mL/min/{1.73_m2} (ref >59)
GFR, EST NON AFRICAN AMERICAN: 94 mL/min/{1.73_m2} (ref >59)
Globulin, Total: 2.8 g/dL (ref 1.5–4.5)
Glucose: 102 mg/dL — ABNORMAL HIGH (ref 65–99)
Potassium: 4.7 mmol/L (ref 3.5–5.2)
Sodium: 141 mmol/L (ref 134–144)
TOTAL PROTEIN: 7.3 g/dL (ref 6.0–8.5)

## 2018-07-25 LAB — BAYER DCA HB A1C WAIVED: HB A1C: 5.8 % (ref <7.0)

## 2018-07-25 LAB — LIPID PANEL
Chol/HDL Ratio: 4.2 ratio (ref 0.0–4.4)
Cholesterol, Total: 262 mg/dL — ABNORMAL HIGH (ref 100–199)
HDL: 62 mg/dL (ref >39)
LDL Calculated: 167 mg/dL — ABNORMAL HIGH (ref 0–99)
Triglycerides: 164 mg/dL — ABNORMAL HIGH (ref 0–149)
VLDL Cholesterol Cal: 33 mg/dL (ref 5–40)

## 2018-07-25 MED ORDER — ESOMEPRAZOLE MAGNESIUM 40 MG PO CPDR: 40.0000 mg | DELAYED_RELEASE_CAPSULE | Freq: Every day | ORAL | 1 refills | Status: DC | PRN

## 2018-07-25 MED ORDER — METFORMIN HCL 500 MG PO TABS: ORAL_TABLET | ORAL | 1 refills | Status: DC

## 2018-07-25 NOTE — Patient Instructions (Signed)
Fat and Cholesterol Restricted Diet High levels of fat and cholesterol in your blood may lead to various health problems, such as diseases of the heart, blood vessels, gallbladder, liver, and pancreas. Fats are concentrated sources of energy that come in various forms. Certain types of fat, including saturated fat, may be harmful in excess. Cholesterol is a substance needed by your body in small amounts. Your body makes all the cholesterol it needs. Excess cholesterol comes from the food you eat. When you have high levels of cholesterol and saturated fat in your blood, health problems can develop because the excess fat and cholesterol will gather along the walls of your blood vessels, causing them to narrow. Choosing the right foods will help you control your intake of fat and cholesterol. This will help keep the levels of these substances in your blood within normal limits and reduce your risk of disease. What is my plan? Your health care provider recommends that you:  Limit your fat intake to ______% or less of your total calories per day.  Limit the amount of cholesterol in your diet to less than _________mg per day.  Eat 20-30 grams of fiber each day.  What types of fat should I choose?  Choose healthy fats more often. Choose monounsaturated and polyunsaturated fats, such as olive and canola oil, flaxseeds, walnuts, almonds, and seeds.  Eat more omega-3 fats. Good choices include salmon, mackerel, sardines, tuna, flaxseed oil, and ground flaxseeds. Aim to eat fish at least two times a week.  Limit saturated fats. Saturated fats are primarily found in animal products, such as meats, butter, and cream. Plant sources of saturated fats include palm oil, palm kernel oil, and coconut oil.  Avoid foods with partially hydrogenated oils in them. These contain trans fats. Examples of foods that contain trans fats are stick margarine, some tub margarines, cookies, crackers, and other baked goods. What  general guidelines do I need to follow? These guidelines for healthy eating will help you control your intake of fat and cholesterol:  Check food labels carefully to identify foods with trans fats or high amounts of saturated fat.  Fill one half of your plate with vegetables and green salads.  Fill one fourth of your plate with whole grains. Look for the word "whole" as the first word in the ingredient list.  Fill one fourth of your plate with lean protein foods.  Limit fruit to two servings a day. Choose fruit instead of juice.  Eat more foods that contain fiber, such as apples, broccoli, carrots, beans, peas, and barley.  Eat more home-cooked food and less restaurant, buffet, and fast food.  Limit or avoid alcohol.  Limit foods high in starch and sugar.  Limit fried foods.  Cook foods using methods other than frying. Baking, boiling, grilling, and broiling are all great options.  Lose weight if you are overweight. Losing just 5-10% of your initial body weight can help your overall health and prevent diseases such as diabetes and heart disease.  What foods can I eat? Grains  Whole grains, such as whole wheat or whole grain breads, crackers, cereals, and pasta. Unsweetened oatmeal, bulgur, barley, quinoa, or brown rice. Corn or whole wheat flour tortillas. Vegetables  Fresh or frozen vegetables (raw, steamed, roasted, or grilled). Green salads. Fruits  All fresh, canned (in natural juice), or frozen fruits. Meats and other protein foods  Ground beef (85% or leaner), grass-fed beef, or beef trimmed of fat. Skinless chicken or turkey. Ground chicken or turkey.   Pork trimmed of fat. All fish and seafood. Eggs. Dried beans, peas, or lentils. Unsalted nuts or seeds. Unsalted canned or dry beans. Dairy  Low-fat dairy products, such as skim or 1% milk, 2% or reduced-fat cheeses, low-fat ricotta or cottage cheese, or plain low-fat yo Fats and oils  Tub margarines without trans  fats. Light or reduced-fat mayonnaise and salad dressings. Avocado. Olive, canola, sesame, or safflower oils. Natural peanut or almond butter (choose ones without added sugar and oil). The items listed above may not be a complete list of recommended foods or beverages. Contact your dietitian for more options. Foods to avoid Grains  White bread. White pasta. White rice. Cornbread. Bagels, pastries, and croissants. Crackers that contain trans fat. Vegetables  White potatoes. Corn. Creamed or fried vegetables. Vegetables in a cheese sauce. Fruits  Dried fruits. Canned fruit in light or heavy syrup. Fruit juice. Meats and other protein foods  Fatty cuts of meat. Ribs, chicken wings, bacon, sausage, bologna, salami, chitterlings, fatback, hot dogs, bratwurst, and packaged luncheon meats. Liver and organ meats. Dairy  Whole or 2% milk, cream, half-and-half, and cream cheese. Whole milk cheeses. Whole-fat or sweetened yogurt. Full-fat cheeses. Nondairy creamers and whipped toppings. Processed cheese, cheese spreads, or cheese curds. Beverages  Alcohol. Sweetened drinks (such as sodas, lemonade, and fruit drinks or punches). Fats and oils  Butter, stick margarine, lard, shortening, ghee, or bacon fat. Coconut, palm kernel, or palm oils. Sweets and desserts  Corn syrup, sugars, honey, and molasses. Candy. Jam and jelly. Syrup. Sweetened cereals. Cookies, pies, cakes, donuts, muffins, and ice cream. The items listed above may not be a complete list of foods and beverages to avoid. Contact your dietitian for more information. This information is not intended to replace advice given to you by your health care provider. Make sure you discuss any questions you have with your health care provider. Document Released: 12/13/2005 Document Revised: 01/03/2015 Document Reviewed: 03/13/2014 Elsevier Interactive Patient Education  2018 [employer]  

## 2018-07-25 NOTE — Progress Notes (Signed)
Subjective:    Patient ID: Katherine Ryan, female    DOB: 26-Aug-1956, 62 y.o.   MRN: [id number]   Chief Complaint: Medical management of chronic issues  HPI:  1. Essential hypertension  No c/o chest pain, sob or headache. Does not check blood pressure at home. BP Readings from Last 3 Encounters:  04/18/18 132/76  01/10/18 (!) 143/78  10/07/17 134/75     2. Gastroesophageal reflux disease without esophagitis  takes nexium daily. Works well to keep symptoms under control.  3. Type 2 diabetes mellitus without complication, without long-term current use of insulin ([place]) last hgba1c was 6.4% . She is very good about watching her diet but does not check blood sugars everyday.  4. Vitamin D deficiency  Takes vitamin d supplement daily  5. Hyperlipidemia with target LDL less than 100  Does watch fats in diet but does not exercise.  6. BMI 33.0-33.9,adult  Weight is down 9lbs.    Outpatient Encounter Medications as of 07/25/2018  Medication Sig  . atorvastatin (LIPITOR) 80 MG tablet Take 1 tablet (80 mg total) by mouth daily.  . Cholecalciferol (VITAMIN D) 2000 units CAPS Take 1 capsule (2,000 Units total) by mouth daily.  . cyclobenzaprine (FLEXERIL) 5 MG tablet TAKE 1 TABLET BY MOUTH THREE TIMES DAILY AS NEEDED FOR MUSCLE SPASM  . esomeprazole (NEXIUM) 40 MG capsule Take 1 capsule (40 mg total) by mouth daily as needed.  Marland Kitchen ibuprofen (ADVIL,MOTRIN) 200 MG tablet Take 200 mg by mouth every 6 (six) hours as needed.  Marland Kitchen losartan (COZAAR) 100 MG tablet Take 0.5 tablets (50 mg total) by mouth daily.  . metFORMIN (GLUCOPHAGE) 500 MG tablet TAKE ONE TABLET BY MOUTH ONCE DAILY WITH  BREAKFAST  . pyridOXINE (VITAMIN B-6) 100 MG tablet Take 100 mg by mouth daily.    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   Review of Systems  Constitutional: Negative for  activity change and appetite change.  HENT: Negative.   Eyes: Negative for pain.  Respiratory: Negative for shortness of breath.   Cardiovascular: Negative for chest pain, palpitations and leg swelling.  Gastrointestinal: Negative for abdominal pain.  Endocrine: Negative for polydipsia.  Genitourinary: Negative.   Skin: Negative for rash.  Neurological: Negative for dizziness, weakness and headaches.  Hematological: Does not bruise/bleed easily.  Psychiatric/Behavioral: Negative.   All other systems reviewed and are negative.      Objective:   Physical Exam  Constitutional: She is oriented to person, place, and time. She appears well-developed and well-nourished. No distress.  HENT:  Head: Normocephalic.  Nose: Nose normal.  Mouth/Throat: Oropharynx is clear and moist.  Eyes: Pupils are equal, round, and reactive to light. EOM are normal.  Neck: Normal range of motion. Neck supple. No JVD present. Carotid bruit is not present.  Cardiovascular: Normal rate, regular rhythm, normal heart sounds and intact distal pulses.  Large varicose veins bil lower ext.  Pulmonary/Chest: Effort normal and breath sounds normal. No respiratory distress. She has no wheezes. She has no rales. She exhibits no tenderness.  Abdominal: Soft. Normal appearance, normal aorta and bowel sounds are normal. She exhibits no distension, no abdominal bruit, no pulsatile midline mass and no mass. There is no splenomegaly or hepatomegaly. There is no tenderness.  Musculoskeletal: Normal range of motion. She exhibits no edema.  Lymphadenopathy:  She has no cervical adenopathy.  Neurological: She is alert and oriented to person, place, and time. She has normal reflexes.  Skin: Skin is warm and dry.  Psychiatric: She has a normal mood and affect. Her behavior is normal. Judgment and thought content normal.    hgba1c 5.8%  BP (!) 142/80   Pulse 63   Temp (!) 97.1 F (36.2 C) (Oral)   Ht 5\' 2"  (1.575 m)   Wt  171 lb 2 oz (77.6 kg)   LMP 04/04/2005   BMI 31.30 kg/m      Assessment & Plan:  Katherine Ryan comes in today with chief complaint of No chief complaint on file.   Diagnosis and orders addressed:  1. Essential hypertension Low sodium diet  2. Gastroesophageal reflux disease without esophagitis Avoid spicy foods Do not eat 2 hours prior to bedtime - esomeprazole (NEXIUM) 40 MG capsule; Take 1 capsule (40 mg total) by mouth daily as needed.  Dispense: 90 capsule; Refill: 1  3. Type 2 diabetes mellitus without complication, without long-term current use of insulin (HCC) Continue to watch carbs in diet - Bayer DCA Hb A1c Waived - metFORMIN (GLUCOPHAGE) 500 MG tablet; TAKE ONE TABLET BY MOUTH ONCE DAILY WITH  BREAKFAST  Dispense: 90 tablet; Refill: 1  4. Vitamin D deficiency  5. Hyperlipidemia with target LDL less than 100 Low fat diet  6. BMI 31.0-31.9,adult Discussed diet and exercise for person with BMI >25 Will recheck weight in 3-6 months  7. Presence of artificial eye   Labs pending Health Maintenance reviewed Diet and exercise encouraged  Follow up plan: 3 months   [place], FNP

## 2018-07-25 NOTE — Addendum Note (Signed)
Addended by: Chevis Pretty on: 07/25/2018 11:12 AM   Modules accepted: Orders

## 2018-08-18 ENCOUNTER — Ambulatory Visit: Payer: BLUE CROSS/BLUE SHIELD | Admitting: Family Medicine

## 2018-08-18 ENCOUNTER — Encounter: Payer: Self-pay | Admitting: *Deleted

## 2018-08-18 ENCOUNTER — Encounter: Payer: Self-pay | Admitting: Family Medicine

## 2018-08-18 VITALS — BP 153/80 | HR 63 | Temp 98.7°F | Ht 62.0 in | Wt 168.0 lb

## 2018-08-18 DIAGNOSIS — S39012A Strain of muscle, fascia and tendon of lower back, initial encounter: Secondary | ICD-10-CM

## 2018-08-18 MED ORDER — CYCLOBENZAPRINE HCL 5 MG PO TABS: 5.0000 mg | ORAL_TABLET | Freq: Three times a day (TID) | ORAL | 2 refills | Status: DC | PRN

## 2018-08-18 MED ORDER — PREDNISONE 20 MG PO TABS: ORAL_TABLET | ORAL | 0 refills | Status: DC

## 2018-08-18 NOTE — Progress Notes (Signed)
BP (!) 153/80   Pulse 63   Temp 98.7 F (37.1 C) (Oral)   Ht 5\' 2"  (1.575 m)   Wt 168 lb (76.2 kg)   LMP 04/04/2005   BMI 30.73 kg/m    Subjective:    Patient ID: Katherine Ryan, female    DOB: 1956/03/28, 62 y.o.   MRN: [id number]  HPI: Katherine Ryan is a 62 y.o. female presenting on 08/18/2018 for Back Pain (low back )   HPI Low back pain Patient has had issues with her low back before but on this occasion that started just yesterday.  She says she was bending down to feed her cats and then when she came up she had a sharp twist and some sharp pains in her back.  Patient rates the pain is moderate in intensity and hurts more when she tries to bend forward and come up or when she tries to get up from a sitting or laying position.  She says is very stiff.  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  She denies any numbness or weakness or any shooting pain, just mainly on her left lower back where it hurts.  She says on previous times is been her right lower back.  She did take some Flexeril which did help  Relevant past medical, surgical, family and social history reviewed and updated as indicated. Interim medical history since our last visit reviewed. Allergies and medications reviewed and updated.  Review of Systems  Constitutional: Negative for chills and fever.  Eyes: Negative for visual disturbance.  Respiratory: Negative for chest tightness and shortness of breath.   Cardiovascular: Negative for chest pain and leg swelling.  Musculoskeletal: Positive for back pain. Negative for gait problem.  Skin: Negative for rash.  Neurological: Negative for light-headedness and headaches.  Psychiatric/Behavioral: Negative for agitation and behavioral problems.  All other systems reviewed and are negative.   Per HPI unless specifically indicated above        Objective:    BP (!)  153/80   Pulse 63   Temp 98.7 F (37.1 C) (Oral)   Ht 5\' 2"  (1.575 m)   Wt 168 lb (76.2 kg)   LMP 04/04/2005   BMI 30.73 kg/m   Wt Readings from Last 3 Encounters:  08/18/18 168 lb (76.2 kg)  07/25/18 171 lb 2 oz (77.6 kg)  04/18/18 180 lb (81.6 kg)    Physical Exam  Constitutional: She is oriented to person, place, and time. She appears well-developed and well-nourished. No distress.  Eyes: Conjunctivae are normal.  Musculoskeletal: Normal range of motion. She exhibits tenderness (Left lower lumbar tenderness, negative straight leg raise). She exhibits no edema.  Neurological: She is alert and oriented to person, place, and time. Coordination normal.  Skin: Skin is warm and dry. No rash noted. She is not diaphoretic.  Psychiatric: She has a normal mood and affect. Her behavior is normal.  Nursing note and vitals reviewed.       Assessment & Plan:   Problem List Items Addressed This Visit    None    Visit Diagnoses    Strain of lumbar region, initial encounter    -  Primary   Relevant Medications   cyclobenzaprine (FLEXERIL) 5 MG tablet   predniSONE (DELTASONE) 20 MG tablet       Follow up plan: Return if symptoms worsen or fail  to improve.  Counseling provided for all of the vaccine components No orders of the defined types were placed in this encounter.   Caryl Pina, MD [facility] 08/18/2018, 11:26 AM

## 2018-10-31 ENCOUNTER — Ambulatory Visit: Payer: BLUE CROSS/BLUE SHIELD | Admitting: Nurse Practitioner

## 2018-10-31 ENCOUNTER — Encounter: Payer: Self-pay | Admitting: Nurse Practitioner

## 2018-10-31 VITALS — BP 132/82 | HR 69 | Temp 97.0°F | Ht 62.0 in | Wt 162.0 lb

## 2018-10-31 DIAGNOSIS — Z6829 Body mass index (BMI) 29.0-29.9, adult: Secondary | ICD-10-CM

## 2018-10-31 DIAGNOSIS — K219 Gastro-esophageal reflux disease without esophagitis: Secondary | ICD-10-CM

## 2018-10-31 DIAGNOSIS — I10 Essential (primary) hypertension: Secondary | ICD-10-CM | POA: Diagnosis not present

## 2018-10-31 DIAGNOSIS — E785 Hyperlipidemia, unspecified: Secondary | ICD-10-CM

## 2018-10-31 DIAGNOSIS — E119 Type 2 diabetes mellitus without complications: Secondary | ICD-10-CM | POA: Diagnosis not present

## 2018-10-31 DIAGNOSIS — M8580 Other specified disorders of bone density and structure, unspecified site: Secondary | ICD-10-CM

## 2018-10-31 DIAGNOSIS — Z970 Presence of artificial eye: Secondary | ICD-10-CM

## 2018-10-31 DIAGNOSIS — E559 Vitamin D deficiency, unspecified: Secondary | ICD-10-CM

## 2018-10-31 LAB — CMP14+EGFR
ALBUMIN: 4.3 g/dL (ref 3.6–4.8)
ALK PHOS: 55 IU/L (ref 39–117)
ALT: 22 IU/L (ref 0–32)
AST: 21 IU/L (ref 0–40)
Albumin/Globulin Ratio: 1.5 (ref 1.2–2.2)
BUN / CREAT RATIO: 23 (ref 12–28)
BUN: 16 mg/dL (ref 8–27)
Bilirubin Total: 0.2 mg/dL (ref 0.0–1.2)
CO2: 26 mmol/L (ref 20–29)
CREATININE: 0.7 mg/dL (ref 0.57–1.00)
Calcium: 9.5 mg/dL (ref 8.7–10.3)
Chloride: 99 mmol/L (ref 96–106)
GFR calc Af Amer: 107 mL/min/{1.73_m2} (ref >59)
GFR calc non Af Amer: 93 mL/min/{1.73_m2} (ref >59)
GLUCOSE: 105 mg/dL — AB (ref 65–99)
Globulin, Total: 2.8 g/dL (ref 1.5–4.5)
Potassium: 4.1 mmol/L (ref 3.5–5.2)
Sodium: 140 mmol/L (ref 134–144)
TOTAL PROTEIN: 7.1 g/dL (ref 6.0–8.5)

## 2018-10-31 LAB — LIPID PANEL
Chol/HDL Ratio: 4.1 ratio (ref 0.0–4.4)
Cholesterol, Total: 253 mg/dL — ABNORMAL HIGH (ref 100–199)
HDL: 62 mg/dL (ref >39)
LDL CALC: 165 mg/dL — AB (ref 0–99)
Triglycerides: 130 mg/dL (ref 0–149)
VLDL CHOLESTEROL CAL: 26 mg/dL (ref 5–40)

## 2018-10-31 LAB — BAYER DCA HB A1C WAIVED: HB A1C: 5.7 % (ref <7.0)

## 2018-10-31 MED ORDER — ESOMEPRAZOLE MAGNESIUM 40 MG PO CPDR: 40.0000 mg | DELAYED_RELEASE_CAPSULE | Freq: Every day | ORAL | 1 refills | Status: DC | PRN

## 2018-10-31 MED ORDER — LOSARTAN POTASSIUM 100 MG PO TABS: 50.0000 mg | ORAL_TABLET | Freq: Every day | ORAL | 1 refills | Status: DC

## 2018-10-31 MED ORDER — METFORMIN HCL 500 MG PO TABS: ORAL_TABLET | ORAL | 1 refills | Status: DC

## 2018-10-31 NOTE — Patient Instructions (Signed)
Diabetes Mellitus and Nutrition When you have diabetes (diabetes mellitus), it is very important to have healthy eating habits because your blood sugar (glucose) levels are greatly affected by what you eat and drink. Eating healthy foods in the appropriate amounts, at about the same times every day, can help you:  Control your blood glucose.  Lower your risk of heart disease.  Improve your blood pressure.  Reach or maintain a healthy weight.  Every person with diabetes is different, and each person has different needs for a meal plan. Your health care provider may recommend that you work with a diet and nutrition specialist (dietitian) to make a meal plan that is best for you. Your meal plan may vary depending on factors such as:  The calories you need.  The medicines you take.  Your weight.  Your blood glucose, blood pressure, and cholesterol levels.  Your activity level.  Other health conditions you have, such as heart or kidney disease.  How do carbohydrates affect me? Carbohydrates affect your blood glucose level more than any other type of food. Eating carbohydrates naturally increases the amount of glucose in your blood. Carbohydrate counting is a method for keeping track of how many carbohydrates you eat. Counting carbohydrates is important to keep your blood glucose at a healthy level, especially if you use insulin or take certain oral diabetes medicines. It is important to know how many carbohydrates you can safely have in each meal. This is different for every person. Your dietitian can help you calculate how many carbohydrates you should have at each meal and for snack. Foods that contain carbohydrates include:  Bread, cereal, rice, pasta, and crackers.  Potatoes and corn.  Peas, beans, and lentils.  Milk and yogurt.  Fruit and juice.  Desserts, such as cakes, cookies, ice cream, and candy.  How does alcohol affect me? Alcohol can cause a sudden decrease in blood  glucose (hypoglycemia), especially if you use insulin or take certain oral diabetes medicines. Hypoglycemia can be a life-threatening condition. Symptoms of hypoglycemia (sleepiness, dizziness, and confusion) are similar to symptoms of having too much alcohol. If your health care provider says that alcohol is safe for you, follow these guidelines:  Limit alcohol intake to no more than 1 drink per day for nonpregnant women and 2 drinks per day for men. One drink equals 12 oz of beer, 5 oz of wine, or 1 oz of hard liquor.  Do not drink on an empty stomach.  Keep yourself hydrated with water, diet soda, or unsweetened iced tea.  Keep in mind that regular soda, juice, and other mixers may contain a lot of sugar and must be counted as carbohydrates.  What are tips for following this plan? Reading food labels  Start by checking the serving size on the label. The amount of calories, carbohydrates, fats, and other nutrients listed on the label are based on one serving of the food. Many foods contain more than one serving per package.  Check the total grams (g) of carbohydrates in one serving. You can calculate the number of servings of carbohydrates in one serving by dividing the total carbohydrates by 15. For example, if a food has 30 g of total carbohydrates, it would be equal to 2 servings of carbohydrates.  Check the number of grams (g) of saturated and trans fats in one serving. Choose foods that have low or no amount of these fats.  Check the number of milligrams (mg) of sodium in one serving. Most people   should limit total sodium intake to less than 2,300 mg per day.  Always check the nutrition information of foods labeled as "low-fat" or "nonfat". These foods may be higher in added sugar or refined carbohydrates and should be avoided.  Talk to your dietitian to identify your daily goals for nutrients listed on the label. Shopping  Avoid buying canned, premade, or processed foods. These  foods tend to be high in fat, sodium, and added sugar.  Shop around the outside edge of the grocery store. This includes fresh fruits and vegetables, bulk grains, fresh meats, and fresh dairy. Cooking  Use low-heat cooking methods, such as baking, instead of high-heat cooking methods like deep frying.  Cook using healthy oils, such as olive, canola, or sunflower oil.  Avoid cooking with butter, cream, or high-fat meats. Meal planning  Eat meals and snacks regularly, preferably at the same times every day. Avoid going long periods of time without eating.  Eat foods high in fiber, such as fresh fruits, vegetables, beans, and whole grains. Talk to your dietitian about how many servings of carbohydrates you can eat at each meal.  Eat 4-6 ounces of lean protein each day, such as lean meat, chicken, fish, eggs, or tofu. 1 ounce is equal to 1 ounce of meat, chicken, or fish, 1 egg, or 1/4 cup of tofu.  Eat some foods each day that contain healthy fats, such as avocado, nuts, seeds, and fish. Lifestyle   Check your blood glucose regularly.  Exercise at least 30 minutes 5 or more days each week, or as told by your health care provider.  Take medicines as told by your health care provider.  Do not use any products that contain nicotine or tobacco, such as cigarettes and e-cigarettes. If you need help quitting, ask your health care provider.  Work with a counselor or diabetes educator to identify strategies to manage stress and any emotional and social challenges. What are some questions to ask my health care provider?  Do I need to meet with a diabetes educator?  Do I need to meet with a dietitian?  What number can I call if I have questions?  When are the best times to check my blood glucose? Where to find more information:  American Diabetes Association: diabetes.org/food-and-fitness/food  Academy of Nutrition and Dietetics:  www.eatright.org/resources/health/diseases-and-conditions/diabetes  National Institute of Diabetes and Digestive and Kidney Diseases (NIH): www.niddk.nih.gov/health-information/diabetes/overview/diet-eating-physical-activity Summary  A healthy meal plan will help you control your blood glucose and maintain a healthy lifestyle.  Working with a diet and nutrition specialist (dietitian) can help you make a meal plan that is best for you.  Keep in mind that carbohydrates and alcohol have immediate effects on your blood glucose levels. It is important to count carbohydrates and to use alcohol carefully. This information is not intended to replace advice given to you by your health care provider. Make sure you discuss any questions you have with your health care provider. Document Released: 09/09/2005 Document Revised: 01/17/2017 Document Reviewed: 01/17/2017 Elsevier Interactive Patient Education  2018 Elsevier Inc.  

## 2018-10-31 NOTE — Progress Notes (Signed)
Subjective:    Patient ID: Katherine Ryan, female    DOB: October 21, 1956, 62 y.o.   MRN: [id number]   Chief Complaint: .medical management of chronic issues  HPI:  1. Essential hypertension  No c/o chest pain, sob or headache. Does not check blood pressure at home. BP Readings from Last 3 Encounters:  08/18/18 (!) 153/80  07/25/18 (!) 142/80  04/18/18 132/76     2. Gastroesophageal reflux disease without esophagitis  Is currently on nexium daily, which works well to keep symptoms under control.  3. Type 2 diabetes mellitus without complication, without long-term current use of insulin ([place]) last hgab1c was 5.8%. She watches diet but does not do much exercise.  4. Osteopenia, unspecified location  Last dexascan was 05/04/18 with t score of -1.2.   5. Presence of artificial eye  She had right eye removed and artificial eye put in. It tracks well with other eye. Not having any problems with it.  6. Vitamin D deficiency  Takes a daily vitamin d supplement  7. Hyperlipidemia with target LDL less than 100  Watches diet. But does not exercise very much.  8. BMI 33.0-33.9,adult  Weight down 6lbs from previous visit    Outpatient Encounter Medications as of 10/31/2018  Medication Sig  . Cholecalciferol (VITAMIN D) 2000 units CAPS Take 1 capsule (2,000 Units total) by mouth daily.  . cyclobenzaprine (FLEXERIL) 5 MG tablet Take 1 tablet (5 mg total) by mouth 3 (three) times daily as needed. for muscle spams  . esomeprazole (NEXIUM) 40 MG capsule Take 1 capsule (40 mg total) by mouth daily as needed.  Marland Kitchen ibuprofen (ADVIL,MOTRIN) 200 MG tablet Take 200 mg by mouth every 6 (six) hours as needed.  Marland Kitchen losartan (COZAAR) 100 MG tablet Take 0.5 tablets (50 mg total) by mouth daily.  . metFORMIN (GLUCOPHAGE) 500 MG tabllet TAKE ONE TABLET BY MOUTH ONCE DAILY WITH  BREAKFAST  . 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   Review of Systems  Constitutional: Negative for activity change and appetite change.  HENT: Negative.   Eyes: Negative for pain.  Respiratory: Negative for shortness of breath.   Cardiovascular: Negative for chest pain, palpitations and leg swelling.  Gastrointestinal: Negative for abdominal pain.  Endocrine: Negative for polydipsia.  Genitourinary: Negative.   Skin: Negative for rash.  Neurological: Negative for dizziness, weakness and headaches.  Hematological: Does not bruise/bleed easily.  Psychiatric/Behavioral: Negative.   All other systems reviewed and are negative.      Objective:   Physical Exam  Constitutional: She is oriented to person, place, and time. She appears well-developed and well-nourished. No distress.  HENT:  Head: Normocephalic.  Nose: Nose normal.  Mouth/Throat: Oropharynx is clear and moist.  Eyes: Pupils are equal, round, and reactive to light. EOM are normal.  Right eye is artificial- ptosis  Neck: Normal range of motion. Neck supple. No JVD present. Carotid bruit is not present.  Cardiovascular: Normal rate, regular rhythm, normal heart sounds and intact distal pulses.  Pulmonary/Chest: Effort normal and breath sounds normal. No respiratory distress. She has no wheezes. She has no rales. She exhibits no tenderness.  Abdominal: Soft. Normal appearance, normal aorta and bowel sounds are normal. She exhibits no distension, no abdominal bruit, no pulsatile midline mass and no mass. There is no splenomegaly or hepatomegaly. There  is no tenderness.  Musculoskeletal: Normal range of motion. She exhibits no edema.  Lymphadenopathy:    She has no cervical adenopathy.  Neurological: She is alert and oriented to person, place, and time. She has normal reflexes.  Skin: Skin is warm and dry.  Psychiatric: She has a normal mood and affect. Her behavior is normal. Judgment and thought content normal.   Nursing note and vitals reviewed.   BP 132/82   Pulse 69   Temp (!) 97 F (36.1 C) (Oral)   Ht '5\' 2"'  (1.575 m)   Wt 162 lb (73.5 kg)   LMP 04/04/2005   BMI 29.63 kg/m     hgba1c 5.7% Assessment & Plan:  Katherine Ryan comes in today with chief complaint of Medical Management of Chronic Issues   Diagnosis and orders addressed:  1. Essential hypertension Low sodium diet - CMP14+EGFR - losartan (COZAAR) 100 MG tablet; Take 0.5 tablets (50 mg total) by mouth daily.  Dispense: 90 tablet; Refill: 1  2. Gastroesophageal reflux disease without esophagitis Avoid spicy foods Do not eat 2 hours prior to bedtime - esomeprazole (NEXIUM) 40 MG capsule; Take 1 capsule (40 mg total) by mouth daily as needed.  Dispense: 90 capsule; Refill: 1  3. Type 2 diabetes mellitus without complication, without long-term current use of insulin (HCC) Continue to watch carbs in diet - Bayer DCA Hb A1c Waived - Microalbumin / creatinine urine ratio - metFORMIN (GLUCOPHAGE) 500 MG tablet; TAKE ONE TABLET BY MOUTH ONCE DAILY WITH  BREAKFAST  Dispense: 90 tablet; Refill: 1  4. Osteopenia, unspecified location Weight bearing exerises Continue vitamin d supplement  5. Presence of artificial eye  6. Vitamin D deficiency Continue daily vitamin d supplement  7. Hyperlipidemia with target LDL less than 100 Low fat diet - Lipid panel  8. BMI 29.0-29.9,adult Discussed diet and exercise for person with BMI >25 Will recheck weight in 3-6 months   Labs pending Health Maintenance reviewed Diet and exercise encouraged  Follow up plan: 3 months   [name], FNP

## 2018-11-01 LAB — MICROALBUMIN / CREATININE URINE RATIO
Creatinine, Urine: 14.8 mg/dL
Microalbumin, Urine: <3 ug/mL

## 2019-02-05 ENCOUNTER — Encounter: Payer: Self-pay | Admitting: Nurse Practitioner

## 2019-02-05 ENCOUNTER — Ambulatory Visit: Payer: BLUE CROSS/BLUE SHIELD | Admitting: Nurse Practitioner

## 2019-02-05 VITALS — BP 130/79 | HR 54 | Temp 97.0°F | Ht 62.0 in | Wt 158.0 lb

## 2019-02-05 DIAGNOSIS — E119 Type 2 diabetes mellitus without complications: Secondary | ICD-10-CM

## 2019-02-05 DIAGNOSIS — M8580 Other specified disorders of bone density and structure, unspecified site: Secondary | ICD-10-CM

## 2019-02-05 DIAGNOSIS — E785 Hyperlipidemia, unspecified: Secondary | ICD-10-CM | POA: Diagnosis not present

## 2019-02-05 DIAGNOSIS — I10 Essential (primary) hypertension: Secondary | ICD-10-CM | POA: Diagnosis not present

## 2019-02-05 DIAGNOSIS — K219 Gastro-esophageal reflux disease without esophagitis: Secondary | ICD-10-CM | POA: Diagnosis not present

## 2019-02-05 DIAGNOSIS — E559 Vitamin D deficiency, unspecified: Secondary | ICD-10-CM

## 2019-02-05 DIAGNOSIS — Z6828 Body mass index (BMI) 28.0-28.9, adult: Secondary | ICD-10-CM

## 2019-02-05 LAB — BAYER DCA HB A1C WAIVED: HB A1C (BAYER DCA - WAIVED): 5.6 % (ref <7.0)

## 2019-02-05 NOTE — Patient Instructions (Signed)

## 2019-02-05 NOTE — Progress Notes (Signed)
Subjective:    Patient ID: Katherine Ryan, female    DOB: [birth year], 63 y.o.   MRN: [id number]   Chief Complaint: medical management of chronic issues  HPI:  1. Type 2 diabetes mellitus without complication, without long-term current use of insulin ([place]) last hgab1c was 5.7% . She watches her diet and blood sugars range around 120-140 fasting.  2. Essential hypertension  No c/o chest pain, sob or headache.. does not check blood pressure at home. BP Readings from Last 3 Encounters:  10/31/18 132/82  08/18/18 (!) 153/80  07/25/18 (!) 142/80     3. Hyperlipidemia with target LDL less than 100  Dos some exercise on days she is not working. Avoids fried foods.  4. Gastroesophageal reflux disease without esophagitis  Takes nexium daily which works well to keep symptoms under control.  5. Vitamin D deficiency  Takes a daily vitamin d supplement  6. Osteopenia, unspecified location  Last dexascan was done 05/01/18. B with t score of -1.2.   7. BMI 33.0-33.9,adult  Weight is down 4lbs since last visit but 15 lbs over 6 months    Outpatient Encounter Medications as of 02/05/2019  Medication Sig  . Cholecalciferol (VITAMIN D) 2000 units CAPS Take 1 capsule (2,000 Units total) by mouth daily.  . cyclobenzaprine (FLEXERIL) 5 MG tablet Take 1 tablet (5 mg total) by mouth 3 (three) times daily as needed. for muscle spams  . esomeprazole (NEXIUM) 40 MG capsule Take 1 capsule (40 mg total) by mouth daily as needed.  Marland Kitchen ibuprofen (ADVIL,MOTRIN) 200 MG tablet Take 200 mg by mouth every 6 (six) hours as needed.  Marland Kitchen losartan (COZAAR) 100 MG tablet Take 0.5 tablets (50 mg total) by mouth daily.  . 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    Review of Systems  Constitutional: Negative for activity change and appetite change.  HENT:  Negative.   Eyes: Negative for pain.  Respiratory: Negative for shortness of breath.   Cardiovascular: Negative for chest pain, palpitations and leg swelling.  Gastrointestinal: Negative for abdominal pain.  Endocrine: Negative for polydipsia.  Genitourinary: Negative.   Skin: Negative for rash.  Neurological: Negative for dizziness, weakness and headaches.  Hematological: Does not bruise/bleed easily.  Psychiatric/Behavioral: Negative.   All other systems reviewed and are negative.      Objective:   Physical Exam Vitals signs and nursing note reviewed.  Constitutional:      General: She is not in acute distress.    Appearance: Normal appearance. She is well-developed.  HENT:     Head: Normocephalic.     Nose: Nose normal.  Eyes:     Pupils: Pupils are equal, round, and reactive to light.     Comments: Right artificial eye  Neck:     Musculoskeletal: Normal range of motion and neck supple.     Vascular: No carotid bruit or JVD.  Cardiovascular:     Rate and Rhythm: Normal rate and regular rhythm.     Heart sounds: Normal heart sounds.  Pulmonary:     Effort: Pulmonary effort is normal. No respiratory distress.     Breath sounds: Normal breath sounds. No wheezing or rales.  Chest:     Chest wall: No tenderness.  Abdominal:     General: Bowel sounds are normal. There is no distension  or abdominal bruit.     Palpations: Abdomen is soft. There is no hepatomegaly, splenomegaly, mass or pulsatile mass.     Tenderness: There is no abdominal tenderness.  Musculoskeletal: Normal range of motion.  Lymphadenopathy:     Cervical: No cervical adenopathy.  Skin:    General: Skin is warm and dry.  Neurological:     Mental Status: She is alert and oriented to person, place, and time.     Deep Tendon Reflexes: Reflexes are normal and symmetric.  Psychiatric:        Behavior: Behavior normal.        Thought Content: Thought content normal.        Judgment: Judgment normal.      BP 130/79   Pulse (!) 54   Temp (!) 97 F (36.1 C) (Oral)   Ht '5\' 2"'  (1.575 m)   Wt 158 lb (71.7 kg)   LMP 04/04/2005   BMI 28.90 kg/m    hgba1c 5.6%    Assessment & Plan:  Kemia Wendel comes in today with chief complaint of Medical Management of Chronic Issues   Diagnosis and orders addressed:  1. Type 2 diabetes mellitus without complication, without long-term current use of insulin ([facility]) Continue to wtahc carbsin diet - Bayer DCA Hb A1c Waived  2. Essential hypertension Low sodium diet - CMP14+EGFR  3. Hyperlipidemia with target LDL less than 100 Continue to watch fat in diet - Lipid panel  4. Gastroesophageal reflux disease without esophagitis Avoid spicy foods Do not eat 2 hours prior to bedtime  5. Vitamin D deficiency Continue daily vitamin d supplement   6. Osteopenia, unspecified location Add calcium supplement to meds  7. BMI 28.0-28.9,adult Discussed diet and exercise for person with BMI >25 Will recheck weight in 3-6 months   Labs pending Health Maintenance reviewed Diet and exercise encouraged  Follow up plan: 3 months   [name], FNP

## 2019-02-06 LAB — CMP14+EGFR
ALBUMIN: 4.2 g/dL (ref 3.8–4.8)
ALK PHOS: 72 IU/L (ref 39–117)
ALT: 35 IU/L — ABNORMAL HIGH (ref 0–32)
AST: 24 IU/L (ref 0–40)
Albumin/Globulin Ratio: 1.6 (ref 1.2–2.2)
BUN / CREAT RATIO: 25 (ref 12–28)
BUN: 19 mg/dL (ref 8–27)
CHLORIDE: 98 mmol/L (ref 96–106)
CO2: 25 mmol/L (ref 20–29)
Calcium: 10.1 mg/dL (ref 8.7–10.3)
Creatinine, Ser: 0.76 mg/dL (ref 0.57–1.00)
GFR calc Af Amer: 97 mL/min/{1.73_m2} (ref >59)
GFR calc non Af Amer: 84 mL/min/{1.73_m2} (ref >59)
GLUCOSE: 111 mg/dL — AB (ref 65–99)
Globulin, Total: 2.6 g/dL (ref 1.5–4.5)
Potassium: 4.6 mmol/L (ref 3.5–5.2)
SODIUM: 141 mmol/L (ref 134–144)
Total Protein: 6.8 g/dL (ref 6.0–8.5)

## 2019-02-06 LAB — LIPID PANEL
CHOLESTEROL TOTAL: 226 mg/dL — AB (ref 100–199)
Chol/HDL Ratio: 3.7 ratio (ref 0.0–4.4)
HDL: 61 mg/dL (ref >39)
LDL Calculated: 141 mg/dL — ABNORMAL HIGH (ref 0–99)
TRIGLYCERIDES: 119 mg/dL (ref 0–149)
VLDL Cholesterol Cal: 24 mg/dL (ref 5–40)

## 2019-02-14 DIAGNOSIS — Z4421 Encounter for fitting and adjustment of artificial right eye: Secondary | ICD-10-CM | POA: Diagnosis not present

## 2019-02-23 DIAGNOSIS — Q111 Other anophthalmos: Secondary | ICD-10-CM | POA: Diagnosis not present

## 2019-02-23 DIAGNOSIS — H02401 Unspecified ptosis of right eyelid: Secondary | ICD-10-CM | POA: Diagnosis not present

## 2019-02-23 DIAGNOSIS — Z9001 Acquired absence of eye: Secondary | ICD-10-CM | POA: Diagnosis not present

## 2019-05-11 ENCOUNTER — Other Ambulatory Visit: Payer: Self-pay

## 2019-05-14 ENCOUNTER — Other Ambulatory Visit: Payer: Self-pay

## 2019-05-14 ENCOUNTER — Encounter: Payer: Self-pay | Admitting: Nurse Practitioner

## 2019-05-14 ENCOUNTER — Ambulatory Visit: Payer: BLUE CROSS/BLUE SHIELD | Admitting: Nurse Practitioner

## 2019-05-14 ENCOUNTER — Ambulatory Visit (INDEPENDENT_AMBULATORY_CARE_PROVIDER_SITE_OTHER): Payer: BLUE CROSS/BLUE SHIELD | Admitting: Nurse Practitioner

## 2019-05-14 VITALS — BP 140/67 | HR 55 | Temp 98.2°F | Ht 62.0 in | Wt 157.0 lb

## 2019-05-14 DIAGNOSIS — Z0000 Encounter for general adult medical examination without abnormal findings: Secondary | ICD-10-CM

## 2019-05-14 DIAGNOSIS — Z8639 Personal history of other endocrine, nutritional and metabolic disease: Secondary | ICD-10-CM | POA: Diagnosis not present

## 2019-05-14 DIAGNOSIS — K219 Gastro-esophageal reflux disease without esophagitis: Secondary | ICD-10-CM

## 2019-05-14 DIAGNOSIS — Z0001 Encounter for general adult medical examination with abnormal findings: Secondary | ICD-10-CM | POA: Diagnosis not present

## 2019-05-14 DIAGNOSIS — I10 Essential (primary) hypertension: Secondary | ICD-10-CM | POA: Diagnosis not present

## 2019-05-14 DIAGNOSIS — E785 Hyperlipidemia, unspecified: Secondary | ICD-10-CM

## 2019-05-14 DIAGNOSIS — E119 Type 2 diabetes mellitus without complications: Secondary | ICD-10-CM | POA: Diagnosis not present

## 2019-05-14 DIAGNOSIS — Z1151 Encounter for screening for human papillomavirus (HPV): Secondary | ICD-10-CM | POA: Diagnosis not present

## 2019-05-14 DIAGNOSIS — Z6833 Body mass index (BMI) 33.0-33.9, adult: Secondary | ICD-10-CM

## 2019-05-14 DIAGNOSIS — Z01411 Encounter for gynecological examination (general) (routine) with abnormal findings: Secondary | ICD-10-CM | POA: Diagnosis not present

## 2019-05-14 LAB — BAYER DCA HB A1C WAIVED: HB A1C (BAYER DCA - WAIVED): 5.5 % (ref <7.0)

## 2019-05-14 MED ORDER — METFORMIN HCL 500 MG PO TABS: ORAL_TABLET | ORAL | 1 refills | Status: DC

## 2019-05-14 MED ORDER — LOSARTAN POTASSIUM 100 MG PO TABS: 50.0000 mg | ORAL_TABLET | Freq: Every day | ORAL | 1 refills | Status: DC

## 2019-05-14 MED ORDER — ESOMEPRAZOLE MAGNESIUM 40 MG PO CPDR: 40.0000 mg | DELAYED_RELEASE_CAPSULE | Freq: Every day | ORAL | 1 refills | Status: DC | PRN

## 2019-05-14 NOTE — Progress Notes (Signed)
Subjective:    Patient ID: Katherine Ryan, female    DOB: Jun 17, 1956, 63 y.o.   MRN: [id number]   Chief Complaint: No chief complaint on file.    HPI:  1. Annual physical exam Last pap was done many years ago. She denies ever having abnormal pap  2. Essential hypertension No c/o chest pain, sob or headache. Does not check blood pressure at home. BP Readings from Last 3 Encounters:  02/05/19 130/79  10/31/18 132/82  08/18/18 (!) 153/80     3. Type 2 diabetes mellitus without complication, without long-term current use of insulin (HCC) Last hgba1c was 5.6%. her blood sugars stay below 120 fasting. Sh e denies any symptoms of low blood sugar.  4. Hyperlipidemia with target LDL less than 100 She does try to watch her diet but doe not do much exercise.  5. Gastroesophageal reflux disease without esophagitis She is on a daily nexium and is dong well. Very seldom has flare ups.  6. BMI 33.0-33.9,adult No recent weight changes    Outpatient Encounter Medications as of 05/14/2019  Medication Sig  . Cholecalciferol (VITAMIN D) 2000 units CAPS Take 1 capsule (2,000 Units total) by mouth daily.  . cyclobenzaprine (FLEXERIL) 5 MG tablet Take 1 tablet (5 mg total) by mouth 3 (three) times daily as needed. for muscle spams  . esomeprazole (NEXIUM) 40 MG capsule Take 1 capsule (40 mg total) by mouth daily as needed.  Marland Kitchen ibuprofen (ADVIL,MOTRIN) 200 MG tablet Take 200 mg by mouth every 6 (six) hours as needed.  Marland Kitchen losartan (COZAAR) 100 MG tablet Take 0.5 tablets (50 mg total) by mouth daily.  . metFORMIN (GLUCOPHAGE) 500 MG tablet TAKE ONE TABLET BY MOUTH ONCE DAILY WITH  BREAKFAST     New complaints: None today  Social history: Lives with her husband who is on disabilty- she is his caregiver.      Review of Systems  Eyes: Negative for redness.  Gastrointestinal: Negative for nausea.  Genitourinary: Negative for hematuria.  Musculoskeletal: Negative for back pain.   Neurological: Negative for dizziness and seizures.  Psychiatric/Behavioral: The patient is not nervous/anxious.   All other systems reviewed and are negative.      Objective:   Physical Exam Constitutional:      Appearance: She is well-developed.  HENT:     Nose: Nose normal.  Neck:     Musculoskeletal: Full passive range of motion without pain, normal range of motion and neck supple.     Thyroid: No thyromegaly.     Vascular: No carotid bruit or JVD.     Trachea: Trachea normal.  Cardiovascular:     Rate and Rhythm: Normal rate and regular rhythm.     Heart sounds: Normal heart sounds. No murmur. No friction rub. No gallop.   Pulmonary:     Effort: Pulmonary effort is normal.     Breath sounds: Normal breath sounds.  Abdominal:     General: Bowel sounds are normal. There is no distension.     Palpations: Abdomen is soft. There is no mass.     Tenderness: There is no abdominal tenderness.  Genitourinary:    Comments: Cervix os parous and pink No vaginal discharge No adnexal masses or tenderness Musculoskeletal: Normal range of motion.  Lymphadenopathy:     Cervical: No cervical adenopathy.  Skin:    General: Skin is warm and dry.  Neurological:     Mental Status: She is alert and oriented to person, place, and time.  Deep Tendon Reflexes: Reflexes are normal and symmetric.  Psychiatric:        Behavior: Behavior normal.        Thought Content: Thought content normal.        Judgment: Judgment normal.    BP 140/67   Pulse (!) 55   Temp 98.2 F (36.8 C) (Oral)   Ht '5\' 2"'  (1.575 m)   Wt 157 lb (71.2 kg)   LMP 04/04/2005   BMI 28.72 kg/m         Assessment & Plan:  Katherine Ryan comes in today with chief complaint of Annual Exam   Diagnosis and orders addressed:  1. Annual physical exam - Thyroid Panel With TSH - CBC with Differential/Platelet - IGP, Aptima HPV, rfx 16/18,45  2. Essential hypertension No c/o chest pain, sob or headache -  CMP14+EGFR - losartan (COZAAR) 100 MG tablet; Take 0.5 tablets (50 mg total) by mouth daily.  Dispense: 90 tablet; Refill: 1  3. Type 2 diabetes mellitus without complication, without long-term current use of insulin (HCC) Continue to watch carbs in diet - Bayer DCA Hb A1c Waived - metFORMIN (GLUCOPHAGE) 500 MG tablet; TAKE ONE TABLET BY MOUTH ONCE DAILY WITH  BREAKFAST  Dispense: 90 tablet; Refill: 1  4. Hyperlipidemia with target LDL less than 100 Low fat diet - Lipid panel  5. Gastroesophageal reflux disease without esophagitis Avoid spicy foods Do not eat 2 hours prior to bedtime - esomeprazole (NEXIUM) 40 MG capsule; Take 1 capsule (40 mg total) by mouth daily as needed.  Dispense: 90 capsule; Refill: 1  6. BMI 33.0-33.9,adult Discussed diet and exercise for person with BMI >25 Will recheck weight in 3-6 months   Labs pending Health Maintenance reviewed Diet and exercise encouraged  Follow up plan: 3 months   [name], FNP

## 2019-05-15 LAB — CMP14+EGFR
ALT: 48 IU/L — ABNORMAL HIGH (ref 0–32)
AST: 28 IU/L (ref 0–40)
Albumin/Globulin Ratio: 1.7 (ref 1.2–2.2)
Albumin: 4.7 g/dL (ref 3.8–4.8)
Alkaline Phosphatase: 87 IU/L (ref 39–117)
BUN/Creatinine Ratio: 22 (ref 12–28)
BUN: 15 mg/dL (ref 8–27)
Bilirubin Total: 0.3 mg/dL (ref 0.0–1.2)
CO2: 26 mmol/L (ref 20–29)
Calcium: 9.8 mg/dL (ref 8.7–10.3)
Chloride: 98 mmol/L (ref 96–106)
Creatinine, Ser: 0.67 mg/dL (ref 0.57–1.00)
GFR calc Af Amer: 109 mL/min/{1.73_m2} (ref >59)
GFR calc non Af Amer: 95 mL/min/{1.73_m2} (ref >59)
Globulin, Total: 2.8 g/dL (ref 1.5–4.5)
Glucose: 106 mg/dL — ABNORMAL HIGH (ref 65–99)
Potassium: 5.2 mmol/L (ref 3.5–5.2)
Sodium: 137 mmol/L (ref 134–144)
Total Protein: 7.5 g/dL (ref 6.0–8.5)

## 2019-05-15 LAB — CBC WITH DIFFERENTIAL/PLATELET
Basophils Absolute: 0 10*3/uL (ref 0.0–0.2)
Basos: 0 %
EOS (ABSOLUTE): 0.1 10*3/uL (ref 0.0–0.4)
Eos: 2 %
Hematocrit: 39 % (ref 34.0–46.6)
Hemoglobin: 13.2 g/dL (ref 11.1–15.9)
Immature Grans (Abs): 0 10*3/uL (ref 0.0–0.1)
Immature Granulocytes: 0 %
Lymphocytes Absolute: 1.5 10*3/uL (ref 0.7–3.1)
Lymphs: 32 %
MCH: 31.9 pg (ref 26.6–33.0)
MCHC: 33.8 g/dL (ref 31.5–35.7)
MCV: 94 fL (ref 79–97)
Monocytes Absolute: 0.6 10*3/uL (ref 0.1–0.9)
Monocytes: 12 %
Neutrophils Absolute: 2.5 10*3/uL (ref 1.4–7.0)
Neutrophils: 54 %
Platelets: 222 10*3/uL (ref 150–450)
RBC: 4.14 x10E6/uL (ref 3.77–5.28)
RDW: 13.3 % (ref 11.7–15.4)
WBC: 4.7 10*3/uL (ref 3.4–10.8)

## 2019-05-15 LAB — THYROID PANEL WITH TSH
Free Thyroxine Index: 1.4 (ref 1.2–4.9)
T3 Uptake Ratio: 17 % — ABNORMAL LOW (ref 24–39)
T4, Total: 8.5 ug/dL (ref 4.5–12.0)
TSH: 2.76 u[IU]/mL (ref 0.450–4.500)

## 2019-05-15 LAB — LIPID PANEL
Chol/HDL Ratio: 4.5 ratio — ABNORMAL HIGH (ref 0.0–4.4)
Cholesterol, Total: 274 mg/dL — ABNORMAL HIGH (ref 100–199)
HDL: 61 mg/dL (ref >39)
LDL Calculated: 173 mg/dL — ABNORMAL HIGH (ref 0–99)
Triglycerides: 198 mg/dL — ABNORMAL HIGH (ref 0–149)
VLDL Cholesterol Cal: 40 mg/dL (ref 5–40)

## 2019-05-23 LAB — IGP, APTIMA HPV, RFX 16/18,45: HPV Aptima: NEGATIVE

## 2019-05-24 DIAGNOSIS — Q111 Other anophthalmos: Secondary | ICD-10-CM | POA: Diagnosis not present

## 2019-05-24 DIAGNOSIS — H02401 Unspecified ptosis of right eyelid: Secondary | ICD-10-CM | POA: Diagnosis not present

## 2019-05-24 DIAGNOSIS — Z9001 Acquired absence of eye: Secondary | ICD-10-CM | POA: Diagnosis not present

## 2019-06-29 LAB — HM DIABETES EYE EXAM

## 2019-07-05 DIAGNOSIS — Z9001 Acquired absence of eye: Secondary | ICD-10-CM | POA: Diagnosis not present

## 2019-07-05 DIAGNOSIS — Q111 Other anophthalmos: Secondary | ICD-10-CM | POA: Diagnosis not present

## 2019-07-05 DIAGNOSIS — H02401 Unspecified ptosis of right eyelid: Secondary | ICD-10-CM | POA: Diagnosis not present

## 2019-07-19 DIAGNOSIS — Q111 Other anophthalmos: Secondary | ICD-10-CM | POA: Diagnosis not present

## 2019-07-19 DIAGNOSIS — Z1159 Encounter for screening for other viral diseases: Secondary | ICD-10-CM | POA: Diagnosis not present

## 2019-07-19 DIAGNOSIS — H02401 Unspecified ptosis of right eyelid: Secondary | ICD-10-CM | POA: Diagnosis not present

## 2019-07-19 DIAGNOSIS — Z01812 Encounter for preprocedural laboratory examination: Secondary | ICD-10-CM | POA: Diagnosis not present

## 2019-07-23 DIAGNOSIS — Q111 Other anophthalmos: Secondary | ICD-10-CM | POA: Diagnosis not present

## 2019-07-23 DIAGNOSIS — H02401 Unspecified ptosis of right eyelid: Secondary | ICD-10-CM | POA: Diagnosis not present

## 2019-07-23 HISTORY — PX: BELPHAROPTOSIS REPAIR: SHX369

## 2019-08-22 ENCOUNTER — Other Ambulatory Visit: Payer: Self-pay | Admitting: Family Medicine

## 2019-08-22 DIAGNOSIS — S39012A Strain of muscle, fascia and tendon of lower back, initial encounter: Secondary | ICD-10-CM

## 2019-08-23 ENCOUNTER — Other Ambulatory Visit: Payer: Self-pay

## 2019-08-24 ENCOUNTER — Encounter: Payer: Self-pay | Admitting: Nurse Practitioner

## 2019-08-24 ENCOUNTER — Ambulatory Visit: Payer: BC Managed Care – PPO | Admitting: Nurse Practitioner

## 2019-08-24 VITALS — BP 131/69 | HR 53 | Temp 98.6°F | Ht 62.0 in | Wt 139.0 lb

## 2019-08-24 DIAGNOSIS — I10 Essential (primary) hypertension: Secondary | ICD-10-CM | POA: Diagnosis not present

## 2019-08-24 DIAGNOSIS — E559 Vitamin D deficiency, unspecified: Secondary | ICD-10-CM

## 2019-08-24 DIAGNOSIS — Z970 Presence of artificial eye: Secondary | ICD-10-CM

## 2019-08-24 DIAGNOSIS — K219 Gastro-esophageal reflux disease without esophagitis: Secondary | ICD-10-CM | POA: Diagnosis not present

## 2019-08-24 DIAGNOSIS — E119 Type 2 diabetes mellitus without complications: Secondary | ICD-10-CM | POA: Diagnosis not present

## 2019-08-24 DIAGNOSIS — E785 Hyperlipidemia, unspecified: Secondary | ICD-10-CM | POA: Diagnosis not present

## 2019-08-24 DIAGNOSIS — Z6825 Body mass index (BMI) 25.0-25.9, adult: Secondary | ICD-10-CM

## 2019-08-24 DIAGNOSIS — M8580 Other specified disorders of bone density and structure, unspecified site: Secondary | ICD-10-CM

## 2019-08-24 LAB — BAYER DCA HB A1C WAIVED: HB A1C (BAYER DCA - WAIVED): 5.6 % (ref <7.0)

## 2019-08-24 MED ORDER — LOSARTAN POTASSIUM 100 MG PO TABS: 50.0000 mg | ORAL_TABLET | Freq: Every day | ORAL | 1 refills | Status: DC

## 2019-08-24 MED ORDER — METFORMIN HCL 500 MG PO TABS: ORAL_TABLET | ORAL | 1 refills | Status: DC

## 2019-08-24 NOTE — Patient Instructions (Signed)

## 2019-08-24 NOTE — Progress Notes (Signed)
 Subjective:    Patient ID: Katherine Ryan, female    DOB: 09/08/1956, 62 y.o.   MRN: [id number]   Chief Complaint: medical management of chronic issues   HPI:  1. Essential hypertension No c/o chest pain, sob or headache. Does not check blood pressure at home. BP Readings from Last 3 Encounters:  05/14/19 140/67  02/05/19 130/79  10/31/18 132/82     2. Type 2 diabetes mellitus without complication, without long-term current use of insulin (HCC) Fasting blood sugars have been good, running below 140. Katherine Ryan does watch Katherine Ryan diet. Katherine Ryan deneis any symptoms of low blood sugar. Lab Results  Component Value Date   HGBA1C 5.5 05/14/2019     3. Hyperlipidemia with target LDL less than 100 Does watch diet but does no exercise. Lab Results  Component Value Date   CHOL 274 (H) 05/14/2019   HDL 61 05/14/2019   LDLCALC 173 (H) 05/14/2019   TRIG 198 (H) 05/14/2019   CHOLHDL 4.5 (H) 05/14/2019     4. Gastroesophageal reflux disease without esophagitis Takes nexium daily and works well to keep symptom under control.  5. Osteopenia, unspecified location Last dexascan was done 04/18/18 wit tscore of -1.2. Katherine Ryan does not due any weight bearing exercises  6. Presence of artificial right eye Doing well with artificial eye  7. Vitamin D deficiency Takes daily vitamin d supplement  8. BMI 33.0-33.9,adult Weight is down 24lbs since last visit    Outpatient Encounter Medications as of 08/24/2019  Medication Sig  . Cholecalciferol (VITAMIN D) 2000 units CAPS Take 1 capsule (2,000 Units total) by mouth daily.  . cyclobenzaprine (FLEXERIL) 5 MG tablet Take 1 tablet by mouth three times daily as needed for muscle spasm  . esomeprazole (NEXIUM) 40 MG capsule Take 1 capsule (40 mg total) by mouth daily as needed.  . ibuprofen (ADVIL,MOTRIN) 200 MG tablet Take 200 mg by mouth every 6 (six) hours as needed.  . losartan (COZAAR) 100 MG tablet Take 0.5 tablets (50 mg total) by mouth daily.  .  metFORMIN (GLUCOPHAGE) 500 MG tablet TAKE ONE TABLET BY MOUTH ONCE DAILY WITH  BREAKFAST    Past Surgical History:  Procedure Laterality Date  . CARPAL TUNNEL RELEASE Right   . CATARACT PEDIATRIC     right    Family History  Problem Relation Age of Onset  . Diabetes Mother   . Hypertension Mother   . Asthma Father   . Arthritis Father     New complaints: None today  Social history: Has recently gone back to work and is currently working  Night shift  Controlled substance contract: n/a    Review of Systems  Constitutional: Negative for activity change and appetite change.  HENT: Negative.   Eyes: Negative for pain.  Respiratory: Negative for shortness of breath.   Cardiovascular: Negative for chest pain, palpitations and leg swelling.  Gastrointestinal: Negative for abdominal pain.  Endocrine: Negative for polydipsia.  Genitourinary: Negative.   Skin: Negative for rash.  Neurological: Negative for dizziness, weakness and headaches.  Hematological: Does not bruise/bleed easily.  Psychiatric/Behavioral: Negative.   All other systems reviewed and are negative.      Objective:   Physical Exam Vitals signs and nursing note reviewed.  Constitutional:      General: Katherine Ryan is not in acute distress.    Appearance: Normal appearance. Katherine Ryan is well-developed.  HENT:     Head: Normocephalic.     Nose: Nose normal.  Eyes:     Pupils:   Pupils are equal, round, and reactive to light.  Neck:     Musculoskeletal: Normal range of motion and neck supple.     Vascular: No carotid bruit or JVD.  Cardiovascular:     Rate and Rhythm: Normal rate and regular rhythm.     Heart sounds: Normal heart sounds.  Pulmonary:     Effort: Pulmonary effort is normal. No respiratory distress.     Breath sounds: Normal breath sounds. No wheezing or rales.  Chest:     Chest wall: No tenderness.  Abdominal:     General: Bowel sounds are normal. There is no distension or abdominal bruit.      Palpations: Abdomen is soft. There is no hepatomegaly, splenomegaly, mass or pulsatile mass.     Tenderness: There is no abdominal tenderness.  Musculoskeletal: Normal range of motion.  Lymphadenopathy:     Cervical: No cervical adenopathy.  Skin:    General: Skin is warm and dry.  Neurological:     Mental Status: Katherine Ryan is alert and oriented to person, place, and time.     Deep Tendon Reflexes: Reflexes are normal and symmetric.  Psychiatric:        Behavior: Behavior normal.        Thought Content: Thought content normal.        Judgment: Judgment normal.    hgba1c 5.6  BP 131/69   Pulse (!) 53   Temp 98.6 F (37 C) (Oral)   Ht 5' 2" (1.575 m)   Wt 139 lb (63 kg)   LMP 04/04/2005   BMI 25.42 kg/m      Assessment & Plan:  Katherine Ryan comes in today with chief complaint of Medical Management of Chronic Issues   Diagnosis and orders addressed:  1. Essential hypertension Low sodium diet - CMP14+EGFR - losartan (COZAAR) 100 MG tablet; Take 0.5 tablets (50 mg total) by mouth daily.  Dispense: 90 tablet; Refill: 1  2. Type 2 diabetes mellitus without complication, without long-term current use of insulin (HCC) Continue to watch carbs in diet - Bayer DCA Hb A1c Waived - metFORMIN (GLUCOPHAGE) 500 MG tablet; TAKE ONE TABLET BY MOUTH ONCE DAILY WITH  BREAKFAST  Dispense: 90 tablet; Refill: 1  3. Hyperlipidemia with target LDL less than 100 Low fat diet - Lipid panel  4. Gastroesophageal reflux disease without esophagitis Avoid spicy foods Do not eat 2 hours prior to bedtime  5. Osteopenia, unspecified location Weight bearing exercise  6. Presence of artificial right eye  7. Vitamin D deficiency   8. BMI 25.0-25.9,adult Discussed diet and exercise for person with BMI >25 Will recheck weight in 3-6 months    Labs pending Health Maintenance reviewed Diet and exercise encouraged  Follow up plan: 6 months   [name], FNP

## 2019-08-25 LAB — CMP14+EGFR
ALT: 21 IU/L (ref 0–32)
AST: 26 IU/L (ref 0–40)
Albumin/Globulin Ratio: 1.8 (ref 1.2–2.2)
Albumin: 4.8 g/dL (ref 3.8–4.8)
Alkaline Phosphatase: 65 IU/L (ref 39–117)
BUN/Creatinine Ratio: 28 (ref 12–28)
BUN: 20 mg/dL (ref 8–27)
Bilirubin Total: 0.3 mg/dL (ref 0.0–1.2)
CO2: 27 mmol/L (ref 20–29)
Calcium: 9.7 mg/dL (ref 8.7–10.3)
Chloride: 98 mmol/L (ref 96–106)
Creatinine, Ser: 0.72 mg/dL (ref 0.57–1.00)
GFR calc Af Amer: 104 mL/min/{1.73_m2} (ref >59)
GFR calc non Af Amer: 90 mL/min/{1.73_m2} (ref >59)
Globulin, Total: 2.7 g/dL (ref 1.5–4.5)
Glucose: 80 mg/dL (ref 65–99)
Potassium: 4.3 mmol/L (ref 3.5–5.2)
Sodium: 138 mmol/L (ref 134–144)
Total Protein: 7.5 g/dL (ref 6.0–8.5)

## 2019-08-25 LAB — LIPID PANEL
Chol/HDL Ratio: 3.1 ratio (ref 0.0–4.4)
Cholesterol, Total: 239 mg/dL — ABNORMAL HIGH (ref 100–199)
HDL: 76 mg/dL (ref >39)
LDL Calculated: 147 mg/dL — ABNORMAL HIGH (ref 0–99)
Triglycerides: 79 mg/dL (ref 0–149)
VLDL Cholesterol Cal: 16 mg/dL (ref 5–40)

## 2019-09-10 ENCOUNTER — Encounter: Payer: Self-pay | Admitting: Family Medicine

## 2019-09-10 ENCOUNTER — Ambulatory Visit: Payer: BC Managed Care – PPO | Admitting: Family Medicine

## 2019-09-10 ENCOUNTER — Other Ambulatory Visit: Payer: Self-pay

## 2019-09-10 VITALS — BP 162/77 | HR 58 | Temp 96.6°F | Resp 18 | Ht 62.0 in | Wt 135.8 lb

## 2019-09-10 DIAGNOSIS — M62830 Muscle spasm of back: Secondary | ICD-10-CM | POA: Diagnosis not present

## 2019-09-10 MED ORDER — CYCLOBENZAPRINE HCL 5 MG PO TABS: ORAL_TABLET | ORAL | 1 refills | Status: DC

## 2019-09-10 MED ORDER — PREDNISONE 10 MG PO TABS: ORAL_TABLET | ORAL | 0 refills | Status: DC

## 2019-09-10 NOTE — Patient Instructions (Signed)

## 2019-09-10 NOTE — Progress Notes (Signed)
Chief Complaint  Patient presents with  . Back Pain    Patient states it has been on and off x 7 years and this morning she turned and her back popped and her pain has gotten worse.    HPI  Patient presents today for recurrent lumbar pain. Flares every couple of years. NKI. Started today when she shifted her weight on a toilet seat. Pain now  8/10. She got some relief with a left over muscle relaxer she had on hand.  PMH: Smoking status noted ROS: Per HPI  Objective: BP (!) 162/77   Pulse (!) 58   Temp (!) 96.6 F (35.9 C) (Temporal)   Resp 18   Ht 5\' 2"  (1.575 m)   Wt 135 lb 12.8 oz (61.6 kg)   LMP 04/04/2005   SpO2 99%   BMI 24.84 kg/m  Gen: NAD, alert, cooperative with exam HEENT: NCAT, EOMI, PERRL CV: RRR, good S1/S2, no murmur Resp: CTABL, no wheezes, non-labored Abd: SNTND, BS present, no guarding or organomegaly Ext: Tender (DAS-28): - spasm palpable at lumbar paraspinous group about L4, bilaterally. Painful ROM for spinal flexion. Neuro: Alert and oriented, No gross deficits  Assessment and plan:  1. Lumbar paraspinal muscle spasm     Meds ordered this encounter  Medications  . DISCONTD: cyclobenzaprine (FLEXERIL) 5 MG tablet    Sig: 5 mg three  timesn  Daily  as needed for Muscle spasms.    Dispense:  30 tablet    Refill:  1  . predniSONE (DELTASONE) 10 MG tablet    Sig: Take 5 daily for 2 days followed by 4,3,2 and 1 for 2 days each.    Dispense:  30 tablet    Refill:  0  . cyclobenzaprine (FLEXERIL) 5 MG tablet    Sig: 5 mg three  Times daily  as needed for Muscle spasms.    Dispense:  30 tablet    Refill:  1   Back exercise sheet printed  Follow up as needed.  Claretta Fraise, MD

## 2019-09-17 ENCOUNTER — Encounter: Payer: Self-pay | Admitting: Family Medicine

## 2020-01-28 ENCOUNTER — Ambulatory Visit: Payer: Self-pay | Admitting: Nurse Practitioner

## 2020-02-22 ENCOUNTER — Other Ambulatory Visit: Payer: Self-pay

## 2020-02-25 ENCOUNTER — Ambulatory Visit: Payer: BC Managed Care – PPO | Admitting: Nurse Practitioner

## 2020-02-25 ENCOUNTER — Other Ambulatory Visit: Payer: Self-pay

## 2020-02-25 ENCOUNTER — Ambulatory Visit (INDEPENDENT_AMBULATORY_CARE_PROVIDER_SITE_OTHER): Payer: BC Managed Care – PPO

## 2020-02-25 ENCOUNTER — Encounter: Payer: Self-pay | Admitting: Nurse Practitioner

## 2020-02-25 VITALS — BP 120/71 | HR 62 | Temp 99.1°F | Resp 20 | Ht 62.0 in | Wt 132.0 lb

## 2020-02-25 DIAGNOSIS — I10 Essential (primary) hypertension: Secondary | ICD-10-CM

## 2020-02-25 DIAGNOSIS — Z6833 Body mass index (BMI) 33.0-33.9, adult: Secondary | ICD-10-CM

## 2020-02-25 DIAGNOSIS — M8580 Other specified disorders of bone density and structure, unspecified site: Secondary | ICD-10-CM

## 2020-02-25 DIAGNOSIS — E785 Hyperlipidemia, unspecified: Secondary | ICD-10-CM

## 2020-02-25 DIAGNOSIS — E559 Vitamin D deficiency, unspecified: Secondary | ICD-10-CM

## 2020-02-25 DIAGNOSIS — K219 Gastro-esophageal reflux disease without esophagitis: Secondary | ICD-10-CM | POA: Diagnosis not present

## 2020-02-25 DIAGNOSIS — E119 Type 2 diabetes mellitus without complications: Secondary | ICD-10-CM | POA: Diagnosis not present

## 2020-02-25 DIAGNOSIS — M7712 Lateral epicondylitis, left elbow: Secondary | ICD-10-CM

## 2020-02-25 LAB — BAYER DCA HB A1C WAIVED: HB A1C (BAYER DCA - WAIVED): 5.6 % (ref <7.0)

## 2020-02-25 MED ORDER — METFORMIN HCL 500 MG PO TABS: ORAL_TABLET | ORAL | 1 refills | Status: DC

## 2020-02-25 MED ORDER — LOSARTAN POTASSIUM 100 MG PO TABS: 50.0000 mg | ORAL_TABLET | Freq: Every day | ORAL | 1 refills | Status: DC

## 2020-02-25 NOTE — Progress Notes (Addendum)
Subjective:    Patient ID: Katherine Ryan, female    DOB: 03-27-1956, 64 y.o.   MRN: [id number]   Chief Complaint: medical management of chronic issues   HPI:  1. Essential hypertension No c/o chest pain, sob or headache. Does not check blood pressure at home. BP Readings from Last 3 Encounters:  09/10/19 (!) 162/77  08/24/19 131/69  05/14/19 140/67     2. Gastroesophageal reflux disease without esophagitis Reflux is doing well since Katherine Ryan has been doing intermittent fasting diet.  3. Type 2 diabetes mellitus without complication, without long-term current use of insulin (HCC) Fasting blood sugars have been running around Lab Results  Component Value Date   HGBA1C 5.6 08/24/2019     4. Hyperlipidemia with target LDL less than 100 Katherine Ryan watches diet very strictly. Does some walking exercising. Lab Results  Component Value Date   CHOL 239 (H) 08/24/2019   HDL 76 08/24/2019   LDLCALC 147 (H) 08/24/2019   TRIG 79 08/24/2019   CHOLHDL 3.1 08/24/2019     5. Vitamin D deficiency Takes a daily vitamin d supplement  6. Osteopenia, unspecified location Last dexascan was Ryan 05/01/18 with t score of -1.2. does weight bearing exercises daily.  7. BMI 33.0-33.9,adult Weight is down 3lbs from previous Wt Readings from Last 3 Encounters:  02/25/20 132 lb (59.9 kg)  09/10/19 135 lb 12.8 oz (61.6 kg)  08/24/19 139 lb (63 kg)   BMI Readings from Last 3 Encounters:  02/25/20 24.14 kg/m  09/10/19 24.84 kg/m  08/24/19 25.42 kg/m      Outpatient Encounter Medications as of 02/25/2020  Medication Sig  . Biotin-D POWD by Does not apply route.  . Calcium Carbonate (CALCIUM 500 PO) Take by mouth.  . Cholecalciferol (VITAMIN D) 2000 units CAPS Take 1 capsule (2,000 Units total) by mouth daily.  . cyclobenzaprine (FLEXERIL) 5 MG tablet 5 mg three  Times daily  as needed for Muscle spasms.  Marland Kitchen ibuprofen (ADVIL,MOTRIN) 200 MG tablet Take 200 mg by mouth every 6 (six) hours as  needed.  Marland Kitchen losartan (COZAAR) 100 MG tablet Take 0.5 tablets (50 mg total) by mouth daily.  . Magnesium 200 MG TABS Take by mouth.  . metFORMIN (GLUCOPHAGE) 500 MG tablet TAKE ONE TABLET BY MOUTH ONCE DAILY WITH  BREAKFAST  . predniSONE (DELTASONE) 10 MG tablet Take 5 daily for 2 days followed by 4,3,2 and 1 for 2 days each.    Past Surgical History:  Procedure Laterality Date  . BELPHAROPTOSIS REPAIR Right 07/23/2019  . CARPAL TUNNEL RELEASE Right   . CATARACT PEDIATRIC     right    Family History  Problem Relation Age of Onset  . Diabetes Mother   . Hypertension Mother   . Asthma Father   . Arthritis Father     New complaints: Left elbow pain for over a month. Hurts worse when Katherine Ryan is working  Social history: Lives with husband who has dementia  Controlled substance contract: n/a    Review of Systems  Constitutional: Negative for diaphoresis.  Eyes: Negative for pain.  Respiratory: Negative for shortness of breath.   Cardiovascular: Negative for chest pain, palpitations and leg swelling.  Gastrointestinal: Negative for abdominal pain.  Endocrine: Negative for polydipsia.  Musculoskeletal: Arthralgias: left elbow.  Skin: Negative for rash.  Neurological: Negative for dizziness, weakness and headaches.  Hematological: Does not bruise/bleed easily.  All other systems reviewed and are negative.      Objective:   Physical Exam  Vitals and nursing note reviewed.  Constitutional:      General: Katherine Ryan is not in acute distress.    Appearance: Normal appearance. Katherine Ryan is well-developed.  HENT:     Head: Normocephalic.     Nose: Nose normal.  Eyes:     Pupils: Pupils are equal, round, and reactive to light.  Neck:     Vascular: No carotid bruit or JVD.  Cardiovascular:     Rate and Rhythm: Normal rate and regular rhythm.     Heart sounds: Normal heart sounds.  Pulmonary:     Effort: Pulmonary effort is normal. No respiratory distress.     Breath sounds: Normal breath  sounds. No wheezing or rales.  Chest:     Chest wall: No tenderness.  Abdominal:     General: Bowel sounds are normal. There is no distension or abdominal bruit.     Palpations: Abdomen is soft. There is no hepatomegaly, splenomegaly, mass or pulsatile mass.     Tenderness: There is no abdominal tenderness.  Musculoskeletal:        General: Normal range of motion.     Cervical back: Normal range of motion and neck supple.     Comments: Point tenderness to laterally - left elbow  Lymphadenopathy:     Cervical: No cervical adenopathy.  Skin:    General: Skin is warm and dry.  Neurological:     Mental Status: Katherine Ryan is alert and oriented to person, place, and time.     Deep Tendon Reflexes: Reflexes are normal and symmetric.  Psychiatric:        Behavior: Behavior normal.        Thought Content: Thought content normal.        Judgment: Judgment normal.     BP 120/71   Pulse 62   Temp 99.1 F (37.3 C) (Temporal)   Resp 20   Ht '5\' 2"'$  (1.575 m)   Wt 132 lb (59.9 kg)   LMP 04/04/2005   SpO2 99%   BMI 24.14 kg/m   Hgba1c 5.6%  ekg- sinus bradycardia-Katherine Hassell Done, FNP  Chest xray- no cardiopulmonary abnormalities-Preliminary reading by Katherine Collum, FNP  Katherine Ryan     Assessment & Plan:  Katherine Ryan comes in today with chief complaint of Medical Management of Chronic Issues   Diagnosis and orders addressed:  1. Essential hypertension Low sodium diet - CBC with Differential/Platelet - CMP14+EGFR - DG Chest 2 View; Future - EKG 12-Lead - losartan (COZAAR) 100 MG tablet; Take 0.5 tablets (50 mg total) by mouth daily.  Dispense: 90 tablet; Refill: 1  2. Gastroesophageal reflux disease without esophagitis Avoid spicy foods Do not eat 2 hours prior to bedtime  3. Type 2 diabetes mellitus without complication, without long-term current use of insulin (HCC) Continue to watch carbs in diet - Bayer DCA Hb A1c Waived - Microalbumin / creatinine urine ratio - metFORMIN  (GLUCOPHAGE) 500 MG tablet; TAKE ONE TABLET BY MOUTH ONCE DAILY WITH  BREAKFAST  Dispense: 90 tablet; Refill: 1  4. Hyperlipidemia with target LDL less than 100 low fat diet - Lipid panel  5. Vitamin D deficiency Continue daily vitamin d   6. Osteopenia, unspecified location continue weight bearing exercises  7. BMI 33.0-33.9,adult Discussed diet and exercise for person with BMI >25 Will recheck weight in 3-6 months  8. Lateral epicondysis Tennis elbow strap- showed how to wear motrin as needed  Labs pending Health Maintenance reviewed Diet and exercise encouraged  Follow up plan: 6 months  Katherine Hassell Done, FNP

## 2020-02-26 LAB — CMP14+EGFR
ALT: 51 IU/L — ABNORMAL HIGH (ref 0–32)
AST: 38 IU/L (ref 0–40)
Albumin/Globulin Ratio: 1.8 (ref 1.2–2.2)
Albumin: 4.6 g/dL (ref 3.8–4.8)
Alkaline Phosphatase: 90 IU/L (ref 39–117)
BUN/Creatinine Ratio: 26 (ref 12–28)
BUN: 18 mg/dL (ref 8–27)
Bilirubin Total: <0.2 mg/dL (ref 0.0–1.2)
CO2: 24 mmol/L (ref 20–29)
Calcium: 9.7 mg/dL (ref 8.7–10.3)
Chloride: 101 mmol/L (ref 96–106)
Creatinine, Ser: 0.7 mg/dL (ref 0.57–1.00)
GFR calc Af Amer: 107 mL/min/{1.73_m2} (ref >59)
GFR calc non Af Amer: 93 mL/min/{1.73_m2} (ref >59)
Globulin, Total: 2.5 g/dL (ref 1.5–4.5)
Glucose: 104 mg/dL — ABNORMAL HIGH (ref 65–99)
Potassium: 4 mmol/L (ref 3.5–5.2)
Sodium: 140 mmol/L (ref 134–144)
Total Protein: 7.1 g/dL (ref 6.0–8.5)

## 2020-02-26 LAB — CBC WITH DIFFERENTIAL/PLATELET
Basophils Absolute: 0 10*3/uL (ref 0.0–0.2)
Basos: 1 %
EOS (ABSOLUTE): 0.1 10*3/uL (ref 0.0–0.4)
Eos: 2 %
Hematocrit: 39.3 % (ref 34.0–46.6)
Hemoglobin: 13.2 g/dL (ref 11.1–15.9)
Immature Grans (Abs): 0 10*3/uL (ref 0.0–0.1)
Immature Granulocytes: 0 %
Lymphocytes Absolute: 1.1 10*3/uL (ref 0.7–3.1)
Lymphs: 27 %
MCH: 30.5 pg (ref 26.6–33.0)
MCHC: 33.6 g/dL (ref 31.5–35.7)
MCV: 91 fL (ref 79–97)
Monocytes Absolute: 0.4 10*3/uL (ref 0.1–0.9)
Monocytes: 9 %
Neutrophils Absolute: 2.6 10*3/uL (ref 1.4–7.0)
Neutrophils: 61 %
Platelets: 217 10*3/uL (ref 150–450)
RBC: 4.33 x10E6/uL (ref 3.77–5.28)
RDW: 13 % (ref 11.7–15.4)
WBC: 4.2 10*3/uL (ref 3.4–10.8)

## 2020-02-26 LAB — LIPID PANEL
Chol/HDL Ratio: 3 ratio (ref 0.0–4.4)
Cholesterol, Total: 313 mg/dL — ABNORMAL HIGH (ref 100–199)
HDL: 104 mg/dL (ref >39)
LDL Chol Calc (NIH): 201 mg/dL — ABNORMAL HIGH (ref 0–99)
Triglycerides: 62 mg/dL (ref 0–149)
VLDL Cholesterol Cal: 8 mg/dL (ref 5–40)

## 2020-02-27 ENCOUNTER — Other Ambulatory Visit: Payer: BC Managed Care – PPO

## 2020-02-27 ENCOUNTER — Other Ambulatory Visit: Payer: Self-pay

## 2020-02-27 DIAGNOSIS — E119 Type 2 diabetes mellitus without complications: Secondary | ICD-10-CM | POA: Diagnosis not present

## 2020-02-28 LAB — MICROALBUMIN / CREATININE URINE RATIO
Creatinine, Urine: 5.2 mg/dL
Microalbumin, Urine: <3 ug/mL

## 2020-03-03 ENCOUNTER — Telehealth: Payer: Self-pay | Admitting: Nurse Practitioner

## 2020-03-03 NOTE — Telephone Encounter (Signed)
Refer to lab notes 

## 2020-03-20 DIAGNOSIS — Z23 Encounter for immunization: Secondary | ICD-10-CM | POA: Diagnosis not present

## 2020-04-12 DIAGNOSIS — Z23 Encounter for immunization: Secondary | ICD-10-CM | POA: Diagnosis not present

## 2020-05-07 DIAGNOSIS — S0571XD Avulsion of right eye, subsequent encounter: Secondary | ICD-10-CM | POA: Diagnosis not present

## 2020-05-12 ENCOUNTER — Encounter: Payer: Self-pay | Admitting: Family Medicine

## 2020-05-12 ENCOUNTER — Telehealth (INDEPENDENT_AMBULATORY_CARE_PROVIDER_SITE_OTHER): Payer: BC Managed Care – PPO | Admitting: Family Medicine

## 2020-05-12 DIAGNOSIS — R21 Rash and other nonspecific skin eruption: Secondary | ICD-10-CM

## 2020-05-12 DIAGNOSIS — W57XXXA Bitten or stung by nonvenomous insect and other nonvenomous arthropods, initial encounter: Secondary | ICD-10-CM

## 2020-05-12 MED ORDER — DOXYCYCLINE HYCLATE 100 MG PO TABS: 100.0000 mg | ORAL_TABLET | Freq: Two times a day (BID) | ORAL | 0 refills | Status: AC

## 2020-05-12 NOTE — Patient Instructions (Signed)

## 2020-05-12 NOTE — Progress Notes (Signed)
Telephone visit  Subjective: CC: Tick bite PCP: Chevis Pretty, FNP KN:[id number] Katherine Ryan is a 64 y.o. female. Patient provides verbal consent for consult held via phone (video was attempted but unfortunately we were unable to connect).  Due to COVID-19 pandemic this visit was conducted virtually. This visit type was conducted due to national recommendations for restrictions regarding the COVID-19 Pandemic (e.g. social distancing, sheltering in place) in an effort to limit this patient's exposure and mitigate transmission in our community. All issues noted in this document were discussed and addressed.  A physical exam was not performed with this format.   Location of patient: home  Location of provider: [facility] Others present for call: none  1. Tick bite Onset last week on Saturday.  She reports this Saturday she started having sweating/ chills and now she has mild swelling and redness at the tick bite site.  She denies nausea, vomiting, headache, arthralgia.  ROS: Per HPI  Allergies  Allergen Reactions  . Asa Arthritis Strength-Antacid [Aspirin Buffered] Anaphylaxis    GI upset  . Ace Inhibitors Cough  . Nsaids    Past Medical History:  Diagnosis Date  . Blindness of right eye with low vision in contralateral eye   . Diabetes mellitus without complication ([place])   . GERD (gastroesophageal reflux disease)   . Hyperlipidemia     Current Outpatient Medications:  .  Biotin-D POWD, by Does not apply route., Disp: , Rfl:  .  Calcium Carbonate (CALCIUM 500 PO), Take by mouth., Disp: , Rfl:  .  Cholecalciferol (VITAMIN D) 2000 units CAPS, Take 1 capsule (2,000 Units total) by mouth daily., Disp: 30 capsule, Rfl: 5 .  cyclobenzaprine (FLEXERIL) 5 MG tablet, 5 mg three  Times daily  as needed for Muscle spasms., Disp: 30 tablet, Rfl: 1 .  ibuprofen (ADVIL,MOTRIN) 200 MG tablet, Take 200 mg by mouth every 6 (six) hours as needed., Disp: , Rfl:  .  losartan (COZAAR) 100 MG tablet, Take  0.5 tablets (50 mg total) by mouth daily., Disp: 90 tablet, Rfl: 1 .  Magnesium 200 MG TABS, Take by mouth., Disp: , Rfl:  .  metFORMIN (GLUCOPHAGE) 500 MG tablet, TAKE ONE TABLET BY MOUTH ONCE DAILY WITH  BREAKFAST, Disp: 90 tablet, Rfl: 1  Assessment/ Plan: 64 y.o. female   1. Tick bite, initial encounter Given rash, chills will empirically treat for 2 weeks with doxycycline.  We discussed reasons for reevaluation and potentially prolonged course.  She will follow-up as needed - doxycycline (VIBRA-TABS) 100 MG tablet; Take 1 tablet (100 mg total) by mouth 2 (two) times daily for 14 days.  Dispense: 28 tablet; Refill: 0  2. Rash and nonspecific skin eruption - doxycycline (VIBRA-TABS) 100 MG tablet; Take 1 tablet (100 mg total) by mouth 2 (two) times daily for 14 days.  Dispense: 28 tablet; Refill: 0   Start time: 5:37pm End time: 5:46pm  Total time spent on patient care (including video visit/ documentation): 15 minutes  Katherine Ryan, Katherine Ryan [phone]

## 2020-05-30 ENCOUNTER — Other Ambulatory Visit: Payer: Self-pay

## 2020-05-30 ENCOUNTER — Ambulatory Visit: Payer: BC Managed Care – PPO | Admitting: Nurse Practitioner

## 2020-05-30 ENCOUNTER — Encounter: Payer: Self-pay | Admitting: Nurse Practitioner

## 2020-05-30 VITALS — BP 134/69 | HR 57 | Temp 98.5°F | Ht 62.0 in | Wt 137.1 lb

## 2020-05-30 DIAGNOSIS — E119 Type 2 diabetes mellitus without complications: Secondary | ICD-10-CM

## 2020-05-30 DIAGNOSIS — K219 Gastro-esophageal reflux disease without esophagitis: Secondary | ICD-10-CM | POA: Diagnosis not present

## 2020-05-30 DIAGNOSIS — M8580 Other specified disorders of bone density and structure, unspecified site: Secondary | ICD-10-CM

## 2020-05-30 DIAGNOSIS — E785 Hyperlipidemia, unspecified: Secondary | ICD-10-CM

## 2020-05-30 DIAGNOSIS — E559 Vitamin D deficiency, unspecified: Secondary | ICD-10-CM

## 2020-05-30 DIAGNOSIS — I10 Essential (primary) hypertension: Secondary | ICD-10-CM

## 2020-05-30 DIAGNOSIS — Z6825 Body mass index (BMI) 25.0-25.9, adult: Secondary | ICD-10-CM

## 2020-05-30 LAB — BAYER DCA HB A1C WAIVED: HB A1C (BAYER DCA - WAIVED): 5.5 % (ref <7.0)

## 2020-05-30 MED ORDER — METFORMIN HCL 500 MG PO TABS: ORAL_TABLET | ORAL | 1 refills | Status: DC

## 2020-05-30 MED ORDER — LOSARTAN POTASSIUM 100 MG PO TABS: 50.0000 mg | ORAL_TABLET | Freq: Every day | ORAL | 1 refills | Status: DC

## 2020-05-30 NOTE — Progress Notes (Signed)
° °Subjective:  ° ° Patient ID: Katherine Ryan, female    DOB: 12/15/1956, 63 y.o.   MRN: [id number] ° ° °Chief Complaint: medical management of chronic issues  °  ° °HPI: ° °1. Essential hypertension °No c/o chest ,sob or headaches. Does not check blood pressure at home. °BP Readings from Last 3 Encounters:  °05/30/20 134/69  °02/25/20 120/71  °09/10/19 (!) 162/77  ° ° ° °2. Hyperlipidemia with target LDL less than 100 °Does watch diet and walk for exercise. °Lab Results  °Component Value Date  ° CHOL 313 (H) 02/25/2020  ° HDL 104 02/25/2020  ° LDLCALC 201 (H) 02/25/2020  ° TRIG 62 02/25/2020  ° CHOLHDL 3.0 02/25/2020  ° ° ° °3. Type 2 diabetes mellitus without complication, without long-term current use of insulin (HCC) °Katherine Ryan only checks blood sugars at home when Katherine Ryan feels bad °Lab Results  °Component Value Date  ° HGBA1C 5.6 02/25/2020  ° ° ° °4. Gastroesophageal reflux disease without esophagitis °Has not had any problems since Katherine Ryan starting Katherine Ryan intermittent fasting. ° °5. Osteopenia, unspecified location °Last dexascan was done 05/01/18 with t score of -1.2. doe do weight bearing exercise. ° °6. Vitamin D deficiency °Takes daily vitamin d supplement ° °7. BMI 24.0-24.9,adult °Weight is up 5lbs °Wt Readings from Last 3 Encounters:  °05/30/20 137 lb 2 oz (62.2 kg)  °02/25/20 132 lb (59.9 kg)  °09/10/19 135 lb 12.8 oz (61.6 kg)  ° °BMI Readings from Last 3 Encounters:  °05/30/20 25.08 kg/m²  °02/25/20 24.14 kg/m²  °09/10/19 24.84 kg/m²  ° ° ° ° ° °Outpatient Encounter Medications as of 05/30/2020  °Medication Sig  °• Biotin-D POWD by Does not apply route.  °• Calcium Carbonate (CALCIUM 500 PO) Take by mouth.  °• Cholecalciferol (VITAMIN D) 2000 units CAPS Take 1 capsule (2,000 Units total) by mouth daily.  °• cyclobenzaprine (FLEXERIL) 5 MG tablet 5 mg three  Times daily  as needed for Muscle spasms.  °• ibuprofen (ADVIL,MOTRIN) 200 MG tablet Take 200 mg by mouth every 6 (six) hours as needed.  °• losartan (COZAAR) 100  MG tablet Take 0.5 tablets (50 mg total) by mouth daily.  °• Magnesium 200 MG TABS Take by mouth.  °• metFORMIN (GLUCOPHAGE) 500 MG tablet TAKE ONE TABLET BY MOUTH ONCE DAILY WITH  BREAKFAST  ° ° ° °Past Surgical History:  °Procedure Laterality Date  °• BELPHAROPTOSIS REPAIR Right 07/23/2019  °• CARPAL TUNNEL RELEASE Right   °• CATARACT PEDIATRIC    ° right  ° ° °Family History  °Problem Relation Age of Onset  °• Diabetes Mother   °• Hypertension Mother   °• Asthma Father   °• Arthritis Father   ° ° °New complaints: °None today ° °Social history: °Live with Katherine Ryan husband who has dementia ° °Controlled substance contract: n/a ° ° ° °Review of Systems  °Constitutional: Negative for diaphoresis.  °Eyes: Negative for pain.  °Respiratory: Negative for shortness of breath.   °Cardiovascular: Negative for chest pain, palpitations and leg swelling.  °Gastrointestinal: Negative for abdominal pain.  °Endocrine: Negative for polydipsia.  °Skin: Negative for rash.  °Neurological: Negative for dizziness, weakness and headaches.  °Hematological: Does not bruise/bleed easily.  °All other systems reviewed and are negative. ° ° °   °Objective:  ° Physical Exam °Vitals and nursing note reviewed.  °Constitutional:   °   General: Katherine Ryan is not in acute distress. °   Appearance: Normal appearance. Katherine Ryan is well-developed.  °HENT:  °     Head: Normocephalic.  °   Nose: Nose normal.  °Eyes:  °   Pupils: Pupils are equal, round, and reactive to light.  °Neck:  °   Vascular: No carotid bruit or JVD.  °Cardiovascular:  °   Rate and Rhythm: Normal rate and regular rhythm.  °   Heart sounds: Normal heart sounds.  °Pulmonary:  °   Effort: Pulmonary effort is normal. No respiratory distress.  °   Breath sounds: Normal breath sounds. No wheezing or rales.  °Chest:  °   Chest wall: No tenderness.  °Abdominal:  °   General: Bowel sounds are normal. There is no distension or abdominal bruit.  °   Palpations: Abdomen is soft. There is no hepatomegaly,  splenomegaly, mass or pulsatile mass.  °   Tenderness: There is no abdominal tenderness.  °Musculoskeletal:     °   General: Normal range of motion.  °   Cervical back: Normal range of motion and neck supple.  °Lymphadenopathy:  °   Cervical: No cervical adenopathy.  °Skin: °   General: Skin is warm and dry.  °Neurological:  °   Mental Status: Katherine Ryan is alert and oriented to person, place, and time.  °   Deep Tendon Reflexes: Reflexes are normal and symmetric.  °Psychiatric:     °   Behavior: Behavior normal.     °   Thought Content: Thought content normal.     °   Judgment: Judgment normal.  ° ° °BP 134/69    Pulse (!) 57    Temp 98.5 °F (36.9 °C) (Temporal)    Ht 5' 2" (1.575 m)    Wt 137 lb 2 oz (62.2 kg)    LMP 04/04/2005    BMI 25.08 kg/m²  ° ° ° ° °   °Assessment & Plan:  °Marlena Delfavero comes in today with chief complaint of Medical Management of Chronic Issues ° ° °Diagnosis and orders addressed: ° °1. Essential hypertension °Low sodium diet °- CBC with Differential/Platelet °- CMP14+EGFR °- losartan (COZAAR) 100 MG tablet; Take 0.5 tablets (50 mg total) by mouth daily.  Dispense: 90 tablet; Refill: 1 ° °2. Hyperlipidemia with target LDL less than 100 °Low fat idet °- Lipid panel ° °3. Type 2 diabetes mellitus without complication, without long-term current use of insulin (HCC) °Continue to watch carb in diet °- Bayer DCA Hb A1c Waived °- metFORMIN (GLUCOPHAGE) 500 MG tablet; TAKE ONE TABLET BY MOUTH ONCE DAILY WITH  BREAKFAST  Dispense: 90 tablet; Refill: 1 ° °4. Gastroesophageal reflux disease without esophagitis °Avoid spicy foods °Do not eat 2 hours prior to bedtime ° °5. Osteopenia, unspecified location °Weight bearing exercise °Will do dexascan at next viit ° °6. Vitamin D deficiency °Continue daily vitamin d supplement ° °7. BMI 33.0-33.9,adult °Discussed diet and exercise for person with BMI >25 °Will recheck weight in 3-6 months ° ° ° °Labs pending °Health Maintenance reviewed °Diet and exercise  encouraged ° °Follow up plan: °3 months ° ° °Mary-Margaret Martin, FNP ° °

## 2020-05-30 NOTE — Patient Instructions (Signed)
Diabetes Mellitus and Foot Care Foot care is an important part of your health, especially when you have diabetes. Diabetes may cause you to have problems because of poor blood flow (circulation) to your feet and legs, which can cause your skin to:  Become thinner and drier.  Break more easily.  Heal more slowly.  Peel and crack. You may also have nerve damage (neuropathy) in your legs and feet, causing decreased feeling in them. This means that you may not notice minor injuries to your feet that could lead to more serious problems. Noticing and addressing any potential problems early is the best way to prevent future foot problems. How to care for your feet Foot hygiene  Wash your feet daily with warm water and mild soap. Do not use hot water. Then, pat your feet and the areas between your toes until they are completely dry. Do not soak your feet as this can dry your skin.  Trim your toenails straight across. Do not dig under them or around the cuticle. File the edges of your nails with an emery board or nail file.  Apply a moisturizing lotion or petroleum jelly to the skin on your feet and to dry, brittle toenails. Use lotion that does not contain alcohol and is unscented. Do not apply lotion between your toes. Shoes and socks  Wear clean socks or stockings every day. Make sure they are not too tight. Do not wear knee-high stockings since they may decrease blood flow to your legs.  Wear shoes that fit properly and have enough cushioning. Always look in your shoes before you put them on to be sure there are no objects inside.  To break in new shoes, wear them for just a few hours a day. This prevents injuries on your feet. Wounds, scrapes, corns, and calluses  Check your feet daily for blisters, cuts, bruises, sores, and redness. If you cannot see the bottom of your feet, use a mirror or ask someone for help.  Do not cut corns or calluses or try to remove them with medicine.  If you  find a minor scrape, cut, or break in the skin on your feet, keep it and the skin around it clean and dry. You may clean these areas with mild soap and water. Do not clean the area with peroxide, alcohol, or iodine.  If you have a wound, scrape, corn, or callus on your foot, look at it several times a day to make sure it is healing and not infected. Check for: ? Redness, swelling, or pain. ? Fluid or blood. ? Warmth. ? Pus or a bad smell. General instructions  Do not cross your legs. This may decrease blood flow to your feet.  Do not use heating pads or hot water bottles on your feet. They may burn your skin. If you have lost feeling in your feet or legs, you may not know this is happening until it is too late.  Protect your feet from hot and cold by wearing shoes, such as at the beach or on hot pavement.  Schedule a complete foot exam at least once a year (annually) or more often if you have foot problems. If you have foot problems, report any cuts, sores, or bruises to your health care provider immediately. Contact a health care provider if:  You have a medical condition that increases your risk of infection and you have any cuts, sores, or bruises on your feet.  You have an injury that is not   healing.  You have redness on your legs or feet.  You feel burning or tingling in your legs or feet.  You have pain or cramps in your legs and feet.  Your legs or feet are numb.  Your feet always feel cold.  You have pain around a toenail. Get help right away if:  You have a wound, scrape, corn, or callus on your foot and: ? You have pain, swelling, or redness that gets worse. ? You have fluid or blood coming from the wound, scrape, corn, or callus. ? Your wound, scrape, corn, or callus feels warm to the touch. ? You have pus or a bad smell coming from the wound, scrape, corn, or callus. ? You have a fever. ? You have a red line going up your leg. Summary  Check your feet every day  for cuts, sores, red spots, swelling, and blisters.  Moisturize feet and legs daily.  Wear shoes that fit properly and have enough cushioning.  If you have foot problems, report any cuts, sores, or bruises to your health care provider immediately.  Schedule a complete foot exam at least once a year (annually) or more often if you have foot problems. This information is not intended to replace advice given to you by your health care provider. Make sure you discuss any questions you have with your health care provider. Document Revised: 09/05/2019 Document Reviewed: 01/14/2017 Elsevier Patient Education  2020 [employer]  

## 2020-05-31 LAB — CMP14+EGFR
ALT: 84 IU/L — ABNORMAL HIGH (ref 0–32)
AST: 51 IU/L — ABNORMAL HIGH (ref 0–40)
Albumin/Globulin Ratio: 1.9 (ref 1.2–2.2)
Albumin: 4.5 g/dL (ref 3.8–4.8)
Alkaline Phosphatase: 93 IU/L (ref 48–121)
BUN/Creatinine Ratio: 35 — ABNORMAL HIGH (ref 12–28)
BUN: 24 mg/dL (ref 8–27)
Bilirubin Total: 0.4 mg/dL (ref 0.0–1.2)
CO2: 24 mmol/L (ref 20–29)
Calcium: 9.3 mg/dL (ref 8.7–10.3)
Chloride: 102 mmol/L (ref 96–106)
Creatinine, Ser: 0.68 mg/dL (ref 0.57–1.00)
GFR calc Af Amer: 108 mL/min/{1.73_m2} (ref >59)
GFR calc non Af Amer: 93 mL/min/{1.73_m2} (ref >59)
Globulin, Total: 2.4 g/dL (ref 1.5–4.5)
Glucose: 99 mg/dL (ref 65–99)
Potassium: 4.4 mmol/L (ref 3.5–5.2)
Sodium: 140 mmol/L (ref 134–144)
Total Protein: 6.9 g/dL (ref 6.0–8.5)

## 2020-05-31 LAB — CBC WITH DIFFERENTIAL/PLATELET
Basophils Absolute: 0 10*3/uL (ref 0.0–0.2)
Basos: 1 %
EOS (ABSOLUTE): 0.2 10*3/uL (ref 0.0–0.4)
Eos: 4 %
Hematocrit: 38.9 % (ref 34.0–46.6)
Hemoglobin: 12.8 g/dL (ref 11.1–15.9)
Immature Grans (Abs): 0 10*3/uL (ref 0.0–0.1)
Immature Granulocytes: 0 %
Lymphocytes Absolute: 1.6 10*3/uL (ref 0.7–3.1)
Lymphs: 34 %
MCH: 29.9 pg (ref 26.6–33.0)
MCHC: 32.9 g/dL (ref 31.5–35.7)
MCV: 91 fL (ref 79–97)
Monocytes Absolute: 0.5 10*3/uL (ref 0.1–0.9)
Monocytes: 11 %
Neutrophils Absolute: 2.4 10*3/uL (ref 1.4–7.0)
Neutrophils: 50 %
Platelets: 223 10*3/uL (ref 150–450)
RBC: 4.28 x10E6/uL (ref 3.77–5.28)
RDW: 13.4 % (ref 11.7–15.4)
WBC: 4.7 10*3/uL (ref 3.4–10.8)

## 2020-05-31 LAB — LIPID PANEL
Chol/HDL Ratio: 2.2 ratio (ref 0.0–4.4)
Cholesterol, Total: 265 mg/dL — ABNORMAL HIGH (ref 100–199)
HDL: 121 mg/dL (ref >39)
LDL Chol Calc (NIH): 139 mg/dL — ABNORMAL HIGH (ref 0–99)
Triglycerides: 37 mg/dL (ref 0–149)
VLDL Cholesterol Cal: 5 mg/dL (ref 5–40)

## 2020-06-06 ENCOUNTER — Telehealth: Payer: Self-pay

## 2020-06-06 NOTE — Telephone Encounter (Signed)
Pt aware, she will keep a log

## 2020-06-06 NOTE — Telephone Encounter (Signed)
Continue to monitor. That is high, but not too worrisome. If it continues to be >140/90 will need to be seen to adjust medications. Stress and anxiety can increase this also.

## 2020-06-06 NOTE — Telephone Encounter (Signed)
Patient is concerned because when she checked her blood pressure this morning it was 174/94.  She has taken her blood pressure medication today.  She asked that I let you know and see what you recommend.

## 2020-07-25 DIAGNOSIS — Z1231 Encounter for screening mammogram for malignant neoplasm of breast: Secondary | ICD-10-CM | POA: Diagnosis not present

## 2020-09-05 ENCOUNTER — Other Ambulatory Visit: Payer: Self-pay

## 2020-09-05 ENCOUNTER — Ambulatory Visit: Payer: BC Managed Care – PPO | Admitting: Nurse Practitioner

## 2020-09-05 ENCOUNTER — Encounter: Payer: Self-pay | Admitting: Nurse Practitioner

## 2020-09-05 VITALS — BP 135/66 | HR 57 | Temp 97.2°F | Resp 20 | Ht 62.0 in | Wt 144.0 lb

## 2020-09-05 DIAGNOSIS — E559 Vitamin D deficiency, unspecified: Secondary | ICD-10-CM

## 2020-09-05 DIAGNOSIS — M8588 Other specified disorders of bone density and structure, other site: Secondary | ICD-10-CM

## 2020-09-05 DIAGNOSIS — K219 Gastro-esophageal reflux disease without esophagitis: Secondary | ICD-10-CM

## 2020-09-05 DIAGNOSIS — E785 Hyperlipidemia, unspecified: Secondary | ICD-10-CM

## 2020-09-05 DIAGNOSIS — Z6833 Body mass index (BMI) 33.0-33.9, adult: Secondary | ICD-10-CM

## 2020-09-05 DIAGNOSIS — I10 Essential (primary) hypertension: Secondary | ICD-10-CM | POA: Diagnosis not present

## 2020-09-05 DIAGNOSIS — E119 Type 2 diabetes mellitus without complications: Secondary | ICD-10-CM | POA: Diagnosis not present

## 2020-09-05 LAB — BAYER DCA HB A1C WAIVED: HB A1C (BAYER DCA - WAIVED): 5.8 % (ref <7.0)

## 2020-09-05 MED ORDER — METFORMIN HCL 500 MG PO TABS: ORAL_TABLET | ORAL | 1 refills | Status: DC

## 2020-09-05 MED ORDER — VITAMIN D 50 MCG (2000 UT) PO CAPS: 1.0000 | ORAL_CAPSULE | Freq: Every day | ORAL | 1 refills | Status: DC

## 2020-09-05 MED ORDER — LOSARTAN POTASSIUM 100 MG PO TABS: 50.0000 mg | ORAL_TABLET | Freq: Every day | ORAL | 1 refills | Status: DC

## 2020-09-05 NOTE — Progress Notes (Signed)
Subjective:    Patient ID: Katherine Ryan, female    DOB: 22-Jan-1956, 64 y.o.   MRN: [id number]   Chief Complaint: medical management of chronic issues     HPI:  1. Essential hypertension No c/o chest pain, sob or headache. Does not check blood pressure at home. BP Readings from Last 3 Encounters:  05/30/20 134/69  02/25/20 120/71  09/10/19 (!) 162/77     2. Hyperlipidemia with target LDL less than 100 Does watch diet and exercises daily- says she walks a lot. Lab Results  Component Value Date   CHOL 265 (H) 05/30/2020   HDL 121 05/30/2020   LDLCALC 139 (H) 05/30/2020   TRIG 37 05/30/2020   CHOLHDL 2.2 05/30/2020     3. Type 2 diabetes mellitus without complication, without long-term current use of insulin (HCC) She does not check her blood sugars very often. She does watch sugars in her diet. Lab Results  Component Value Date   HGBA1C 5.5 05/30/2020     4. Gastroesophageal reflux disease without esophagitis Is on no prescription meds. Has occasional symptoms.  5. Vitamin D deficiency Takes a daily vitamin d supplement with calcium  6. Osteopenia of lumbar spine Last dexascan was done 05/04/18 with t score of -1.2.  7. BMI 33.0-33.9,adult Weight is up 5lbs from previous visit Wt Readings from Last 3 Encounters:  05/30/20 137 lb 2 oz (62.2 kg)  02/25/20 132 lb (59.9 kg)  09/10/19 135 lb 12.8 oz (61.6 kg)   BMI Readings from Last 3 Encounters:  05/30/20 25.08 kg/m  02/25/20 24.14 kg/m  09/10/19 24.84 kg/m       Outpatient Encounter Medications as of 09/05/2020  Medication Sig  . Biotin-D POWD by Does not apply route.  . Calcium Carbonate (CALCIUM 500 PO) Take by mouth.  . Cholecalciferol (VITAMIN D) 2000 units CAPS Take 1 capsule (2,000 Units total) by mouth daily.  . cyclobenzaprine (FLEXERIL) 5 MG tablet 5 mg three  Times daily  as needed for Muscle spasms.  Marland Kitchen ibuprofen (ADVIL,MOTRIN) 200 MG tablet Take 200 mg by mouth every 6 (six) hours as  needed.  Marland Kitchen losartan (COZAAR) 100 MG tablet Take 0.5 tablets (50 mg total) by mouth daily.  . Magnesium 200 MG TABS Take by mouth.  . metFORMIN (GLUCOPHAGE) 500 MG tablet TAKE ONE TABLET BY MOUTH ONCE DAILY WITH  BREAKFAST     Past Surgical History:  Procedure Laterality Date  . BELPHAROPTOSIS REPAIR Right 07/23/2019  . CARPAL TUNNEL RELEASE Right   . CATARACT PEDIATRIC     right    Family History  Problem Relation Age of Onset  . Diabetes Mother   . Hypertension Mother   . Asthma Father   . Arthritis Father     New complaints: None today  Social history: Lives with her husband. 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  Eyes: Negative for pain.  Respiratory: Negative for shortness of breath.   Cardiovascular: Negative for chest pain, palpitations and leg swelling.  Gastrointestinal: Negative for abdominal pain.  Endocrine: Negative for polydipsia.  Skin: Negative for rash.  Neurological: Negative for dizziness, weakness and headaches.  Hematological: Does not bruise/bleed easily.  All other systems reviewed and are negative.      Objective:   Physical Exam Vitals and nursing note reviewed.  Constitutional:      General: She is not in acute distress.    Appearance:  Normal appearance. She is well-developed.  HENT:     Head: Normocephalic.     Nose: Nose normal.  Eyes:     Pupils: Pupils are equal, round, and reactive to light.  Neck:     Vascular: No carotid bruit or JVD.  Cardiovascular:     Rate and Rhythm: Normal rate and regular rhythm.     Heart sounds: Normal heart sounds.  Pulmonary:     Effort: Pulmonary effort is normal. No respiratory distress.     Breath sounds: Normal breath sounds. No wheezing or rales.  Chest:     Chest wall: No tenderness.  Abdominal:     General: Bowel sounds are normal. There is no distension or abdominal bruit.     Palpations:  Abdomen is soft. There is no hepatomegaly, splenomegaly, mass or pulsatile mass.     Tenderness: There is no abdominal tenderness.  Musculoskeletal:        General: Normal range of motion.     Cervical back: Normal range of motion and neck supple.  Lymphadenopathy:     Cervical: No cervical adenopathy.  Skin:    General: Skin is warm and dry.  Neurological:     Mental Status: She is alert and oriented to person, place, and time.     Deep Tendon Reflexes: Reflexes are normal and symmetric.  Psychiatric:        Behavior: Behavior normal.        Thought Content: Thought content normal.        Judgment: Judgment normal.    BP 135/66   Pulse (!) 57   Temp (!) 97.2 F (36.2 C) (Temporal)   Resp 20   Ht '5\' 2"'  (1.575 m)   Wt 144 lb (65.3 kg)   LMP 04/04/2005   SpO2 99%   BMI 26.34 kg/m   hgba1c 5.8%      Assessment & Plan:  Katherine Ryan comes in today with chief complaint of Medical Management of Chronic Issues   Diagnosis and orders addressed:  1. Essential hypertension Low sodium diet - CBC with Differential/Platelet - CMP14+EGFR - losartan (COZAAR) 100 MG tablet; Take 0.5 tablets (50 mg total) by mouth daily.  Dispense: 90 tablet; Refill: 1  2. Hyperlipidemia with target LDL less than 100 Low fat diet - Lipid panel  3. Type 2 diabetes mellitus without complication, without long-term current use of insulin (HCC) Continue to watch carbs in diet - Bayer DCA Hb A1c Waived - metFORMIN (GLUCOPHAGE) 500 MG tablet; TAKE ONE TABLET BY MOUTH ONCE DAILY WITH  BREAKFAST  Dispense: 90 tablet; Refill: 1  4. Gastroesophageal reflux disease without esophagitis Avoid spicy foods Do not eat 2 hours prior to bedtime  5. Vitamin D deficiency - Cholecalciferol (VITAMIN D) 50 MCG (2000 UT) CAPS; Take 1 capsule (2,000 Units total) by mouth daily.  Dispense: 90 capsule; Refill: 1  6. Osteopenia of lumbar spine Weight bearing exercise encouraged  7. BMI 33.0-33.9,adult Discussed  diet and exercise for person with BMI >25 Will recheck weight in 3-6 months   Labs pending Health Maintenance reviewed Diet and exercise encouraged  Follow up plan: 3 months   [name], FNP

## 2020-09-05 NOTE — Patient Instructions (Signed)

## 2020-09-06 LAB — CBC WITH DIFFERENTIAL/PLATELET
Basophils Absolute: 0 10*3/uL (ref 0.0–0.2)
Basos: 1 %
EOS (ABSOLUTE): 0.2 10*3/uL (ref 0.0–0.4)
Eos: 3 %
Hematocrit: 40 % (ref 34.0–46.6)
Hemoglobin: 13 g/dL (ref 11.1–15.9)
Immature Grans (Abs): 0 10*3/uL (ref 0.0–0.1)
Immature Granulocytes: 0 %
Lymphocytes Absolute: 1.5 10*3/uL (ref 0.7–3.1)
Lymphs: 30 %
MCH: 30 pg (ref 26.6–33.0)
MCHC: 32.5 g/dL (ref 31.5–35.7)
MCV: 92 fL (ref 79–97)
Monocytes Absolute: 0.6 10*3/uL (ref 0.1–0.9)
Monocytes: 12 %
Neutrophils Absolute: 2.8 10*3/uL (ref 1.4–7.0)
Neutrophils: 54 %
Platelets: 244 10*3/uL (ref 150–450)
RBC: 4.34 x10E6/uL (ref 3.77–5.28)
RDW: 13.1 % (ref 11.7–15.4)
WBC: 5.1 10*3/uL (ref 3.4–10.8)

## 2020-09-06 LAB — CMP14+EGFR
ALT: 31 IU/L (ref 0–32)
AST: 27 IU/L (ref 0–40)
Albumin/Globulin Ratio: 1.7 (ref 1.2–2.2)
Albumin: 4.4 g/dL (ref 3.8–4.8)
Alkaline Phosphatase: 91 IU/L (ref 48–121)
BUN/Creatinine Ratio: 44 — ABNORMAL HIGH (ref 12–28)
BUN: 31 mg/dL — ABNORMAL HIGH (ref 8–27)
Bilirubin Total: 0.2 mg/dL (ref 0.0–1.2)
CO2: 24 mmol/L (ref 20–29)
Calcium: 9.3 mg/dL (ref 8.7–10.3)
Chloride: 99 mmol/L (ref 96–106)
Creatinine, Ser: 0.71 mg/dL (ref 0.57–1.00)
GFR calc Af Amer: 105 mL/min/{1.73_m2} (ref >59)
GFR calc non Af Amer: 91 mL/min/{1.73_m2} (ref >59)
Globulin, Total: 2.6 g/dL (ref 1.5–4.5)
Glucose: 98 mg/dL (ref 65–99)
Potassium: 4.6 mmol/L (ref 3.5–5.2)
Sodium: 135 mmol/L (ref 134–144)
Total Protein: 7 g/dL (ref 6.0–8.5)

## 2020-09-06 LAB — LIPID PANEL
Chol/HDL Ratio: 2.7 ratio (ref 0.0–4.4)
Cholesterol, Total: 285 mg/dL — ABNORMAL HIGH (ref 100–199)
HDL: 106 mg/dL (ref >39)
LDL Chol Calc (NIH): 169 mg/dL — ABNORMAL HIGH (ref 0–99)
Triglycerides: 64 mg/dL (ref 0–149)
VLDL Cholesterol Cal: 10 mg/dL (ref 5–40)

## 2020-09-25 ENCOUNTER — Other Ambulatory Visit: Payer: Self-pay | Admitting: Family Medicine

## 2020-10-28 LAB — HM DIABETES EYE EXAM

## 2020-12-04 DIAGNOSIS — G5602 Carpal tunnel syndrome, left upper limb: Secondary | ICD-10-CM | POA: Diagnosis not present

## 2020-12-04 DIAGNOSIS — M79642 Pain in left hand: Secondary | ICD-10-CM | POA: Diagnosis not present

## 2020-12-04 DIAGNOSIS — M7710 Lateral epicondylitis, unspecified elbow: Secondary | ICD-10-CM | POA: Insufficient documentation

## 2020-12-18 ENCOUNTER — Encounter: Payer: Self-pay | Admitting: Nurse Practitioner

## 2020-12-18 ENCOUNTER — Ambulatory Visit: Payer: BC Managed Care – PPO | Admitting: Nurse Practitioner

## 2020-12-18 ENCOUNTER — Other Ambulatory Visit: Payer: Self-pay

## 2020-12-18 VITALS — BP 128/64 | HR 57 | Temp 97.1°F | Resp 20 | Ht 62.0 in | Wt 149.0 lb

## 2020-12-18 DIAGNOSIS — M8588 Other specified disorders of bone density and structure, other site: Secondary | ICD-10-CM

## 2020-12-18 DIAGNOSIS — E785 Hyperlipidemia, unspecified: Secondary | ICD-10-CM

## 2020-12-18 DIAGNOSIS — E1159 Type 2 diabetes mellitus with other circulatory complications: Secondary | ICD-10-CM

## 2020-12-18 DIAGNOSIS — K219 Gastro-esophageal reflux disease without esophagitis: Secondary | ICD-10-CM | POA: Diagnosis not present

## 2020-12-18 DIAGNOSIS — E559 Vitamin D deficiency, unspecified: Secondary | ICD-10-CM

## 2020-12-18 DIAGNOSIS — Z6827 Body mass index (BMI) 27.0-27.9, adult: Secondary | ICD-10-CM

## 2020-12-18 DIAGNOSIS — I152 Hypertension secondary to endocrine disorders: Secondary | ICD-10-CM

## 2020-12-18 DIAGNOSIS — E119 Type 2 diabetes mellitus without complications: Secondary | ICD-10-CM

## 2020-12-18 LAB — BAYER DCA HB A1C WAIVED: HB A1C (BAYER DCA - WAIVED): 5.9 % (ref <7.0)

## 2020-12-18 MED ORDER — VITAMIN D 50 MCG (2000 UT) PO CAPS: 1.0000 | ORAL_CAPSULE | Freq: Every day | ORAL | 1 refills | Status: DC

## 2020-12-18 MED ORDER — LOSARTAN POTASSIUM 100 MG PO TABS: 50.0000 mg | ORAL_TABLET | Freq: Every day | ORAL | 1 refills | Status: DC

## 2020-12-18 MED ORDER — METFORMIN HCL 500 MG PO TABS: ORAL_TABLET | ORAL | 1 refills | Status: DC

## 2020-12-18 NOTE — Progress Notes (Signed)
Subjective:    Patient ID: Katherine Ryan, female    DOB: 23-Jul-1956, 64 y.o.   MRN: [id number]   Chief Complaint: Medical Management of Chronic Issues    HPI:  1. Hypertension associated with diabetes ([place]) No c/o chest pain, sob or headache. She does not check her blood sugars at home. BP Readings from Last 3 Encounters:  09/05/20 135/66  05/30/20 134/69  02/25/20 120/71     2. Type 2 diabetes mellitus without complication, without long-term current use of insulin (HCC) She has not been checking her blood sugars at home. She usually only checks it when she feels bad. Lab Results  Component Value Date   HGBA1C 5.8 09/05/2020     3. Hyperlipidemia with target LDL less than 100 Does watch diet and stays very active. She does not do any dedicated exercise. Patient does not want to take a statin. Lab Results  Component Value Date   CHOL 285 (H) 09/05/2020   HDL 106 09/05/2020   LDLCALC 169 (H) 09/05/2020   TRIG 64 09/05/2020   CHOLHDL 2.7 09/05/2020     4. Gastroesophageal reflux disease without esophagitis Has not needed any medication as of late. Has had no symptoms  5. Osteopenia of lumbar spine Last dexascan was done 05/04/18. t score was -1.2. she is on daily vitamin d a calcium supplement  6. Vitamin D deficiency Again is on a daily vitamin d supplement  7. BMI 26.0-26.9,adult Weight is up 5lbs from previous Wt Readings from Last 3 Encounters:  12/18/20 149 lb (67.6 kg)  09/05/20 144 lb (65.3 kg)  05/30/20 137 lb 2 oz (62.2 kg)   BMI Readings from Last 3 Encounters:  12/18/20 27.25 kg/m  09/05/20 26.34 kg/m  05/30/20 25.08 kg/m        Outpatient Encounter Medications as of 12/18/2020  Medication Sig   Biotin-D POWD by Does not apply route.   Calcium Carbonate (CALCIUM 500 PO) Take by mouth.   Cholecalciferol (VITAMIN D) 50 MCG (2000 UT) CAPS Take 1 capsule (2,000 Units total) by mouth daily.   cyclobenzaprine (FLEXERIL) 5 MG tablet Take  1 tablet by mouth three times daily as needed for muscle spasm   ibuprofen (ADVIL,MOTRIN) 200 MG tablet Take 200 mg by mouth every 6 (six) hours as needed.   losartan (COZAAR) 100 MG tablet Take 0.5 tablets (50 mg total) by mouth daily.   Magnesium 200 MG TABS Take by mouth.   metFORMIN (GLUCOPHAGE) 500 MG tablet TAKE ONE TABLET BY MOUTH ONCE DAILY WITH  BREAKFAST   No facility-administered encounter medications on file as of 12/18/2020.    Past Surgical History:  Procedure Laterality Date   BELPHAROPTOSIS REPAIR Right 07/23/2019   CARPAL TUNNEL RELEASE Right    CATARACT PEDIATRIC     right    Family History  Problem Relation Age of Onset   Diabetes Mother    Hypertension Mother    Asthma Father    Arthritis Father     New complaints: None today  Social history: Lives with her husband. Still working 12hour days  Controlled substance contract: n/a    Review of Systems  Constitutional: Negative for diaphoresis.  Eyes: Negative for pain.  Respiratory: Negative for shortness of breath.   Cardiovascular: Negative for chest pain, palpitations and leg swelling.  Gastrointestinal: Negative for abdominal pain.  Endocrine: Negative for polydipsia.  Skin: Negative for rash.  Neurological: Negative for dizziness, weakness and headaches.  Hematological: Does not bruise/bleed easily.  All  other systems reviewed and are negative.      Objective:   Physical Exam Vitals and nursing note reviewed.  Constitutional:      General: She is not in acute distress.    Appearance: Normal appearance. She is well-developed and well-nourished.  HENT:     Head: Normocephalic.     Nose: Nose normal.     Mouth/Throat:     Mouth: Oropharynx is clear and moist.  Eyes:     Extraocular Movements: EOM normal.     Pupils: Pupils are equal, round, and reactive to light.  Neck:     Vascular: No carotid bruit or JVD.  Cardiovascular:     Rate and Rhythm: Normal rate and regular  rhythm.     Pulses: Intact distal pulses.     Heart sounds: Normal heart sounds.  Pulmonary:     Effort: Pulmonary effort is normal. No respiratory distress.     Breath sounds: Normal breath sounds. No wheezing or rales.  Chest:     Chest wall: No tenderness.  Abdominal:     General: Bowel sounds are normal. There is no distension or abdominal bruit. Aorta is normal.     Palpations: Abdomen is soft. There is no hepatomegaly, splenomegaly, mass or pulsatile mass.     Tenderness: There is no abdominal tenderness.  Musculoskeletal:        General: No edema. Normal range of motion.     Cervical back: Normal range of motion and neck supple.  Lymphadenopathy:     Cervical: No cervical adenopathy.  Skin:    General: Skin is warm and dry.  Neurological:     Mental Status: She is alert and oriented to person, place, and time.     Deep Tendon Reflexes: Reflexes are normal and symmetric.  Psychiatric:        Mood and Affect: Mood and affect normal.        Behavior: Behavior normal.        Thought Content: Thought content normal.        Judgment: Judgment normal.     BP 128/64    Pulse (!) 57    Temp (!) 97.1 F (36.2 C) (Temporal)    Resp 20    Ht '5\' 2"'  (1.575 m)    Wt 149 lb (67.6 kg)    LMP 04/04/2005    SpO2 97%    BMI 27.25 kg/m   HGBA1c 5.9     Assessment & Plan:  Lanetra Hartley comes in today with chief complaint of Medical Management of Chronic Issues   Diagnosis and orders addressed:  1. Hypertension associated with diabetes ([place]) Low sodium diet - CBC with Differential/Platelet - CMP14+EGFR - losartan (COZAAR) 100 MG tablet; Take 0.5 tablets (50 mg total) by mouth daily.  Dispense: 90 tablet; Refill: 1  2. Type 2 diabetes mellitus without complication, without long-term current use of insulin (HCC) Continue to watch carbsin diet - Bayer DCA Hb A1c Waived - metFORMIN (GLUCOPHAGE) 500 MG tablet; TAKE ONE TABLET BY MOUTH ONCE DAILY WITH  BREAKFAST  Dispense: 90 tablet;  Refill: 1  3. Hyperlipidemia with target LDL less than 100 Low fat diet - Lipid panel  4. Gastroesophageal reflux disease without esophagitis Avoid spicy foods Do not eat 2 hours prior to bedtime  5. Osteopenia of lumbar spine Weight bearing exercise  6. Vitamin D deficiency Continue daily vitamin d supplement - Cholecalciferol (VITAMIN D) 50 MCG (2000 UT) CAPS; Take 1 capsule (2,000 Units total)  by mouth daily.  Dispense: 90 capsule; Refill: 1  7. BMI 27.0-27.9,adult Discussed diet and exercise for person with BMI >25 Will recheck weight in 3-6 months   Labs pending Health Maintenance reviewed Diet and exercise encouraged  Follow up plan: 3 months   [name], FNP

## 2020-12-19 LAB — CBC WITH DIFFERENTIAL/PLATELET
Basophils Absolute: 0 10*3/uL (ref 0.0–0.2)
Basos: 1 %
EOS (ABSOLUTE): 0.1 10*3/uL (ref 0.0–0.4)
Eos: 3 %
Hematocrit: 38.2 % (ref 34.0–46.6)
Hemoglobin: 13 g/dL (ref 11.1–15.9)
Immature Grans (Abs): 0 10*3/uL (ref 0.0–0.1)
Immature Granulocytes: 0 %
Lymphocytes Absolute: 1.6 10*3/uL (ref 0.7–3.1)
Lymphs: 30 %
MCH: 30.7 pg (ref 26.6–33.0)
MCHC: 34 g/dL (ref 31.5–35.7)
MCV: 90 fL (ref 79–97)
Monocytes Absolute: 0.5 10*3/uL (ref 0.1–0.9)
Monocytes: 10 %
Neutrophils Absolute: 3 10*3/uL (ref 1.4–7.0)
Neutrophils: 56 %
Platelets: 219 10*3/uL (ref 150–450)
RBC: 4.24 x10E6/uL (ref 3.77–5.28)
RDW: 13.3 % (ref 11.7–15.4)
WBC: 5.3 10*3/uL (ref 3.4–10.8)

## 2020-12-19 LAB — CMP14+EGFR
ALT: 27 IU/L (ref 0–32)
AST: 22 IU/L (ref 0–40)
Albumin/Globulin Ratio: 1.8 (ref 1.2–2.2)
Albumin: 4.6 g/dL (ref 3.8–4.8)
Alkaline Phosphatase: 77 IU/L (ref 44–121)
BUN/Creatinine Ratio: 23 (ref 12–28)
BUN: 17 mg/dL (ref 8–27)
Bilirubin Total: 0.2 mg/dL (ref 0.0–1.2)
CO2: 28 mmol/L (ref 20–29)
Calcium: 9.6 mg/dL (ref 8.7–10.3)
Chloride: 101 mmol/L (ref 96–106)
Creatinine, Ser: 0.75 mg/dL (ref 0.57–1.00)
GFR calc Af Amer: 97 mL/min/{1.73_m2} (ref >59)
GFR calc non Af Amer: 85 mL/min/{1.73_m2} (ref >59)
Globulin, Total: 2.6 g/dL (ref 1.5–4.5)
Glucose: 90 mg/dL (ref 65–99)
Potassium: 4.1 mmol/L (ref 3.5–5.2)
Sodium: 142 mmol/L (ref 134–144)
Total Protein: 7.2 g/dL (ref 6.0–8.5)

## 2020-12-19 LAB — LIPID PANEL
Chol/HDL Ratio: 2.8 ratio (ref 0.0–4.4)
Cholesterol, Total: 290 mg/dL — ABNORMAL HIGH (ref 100–199)
HDL: 105 mg/dL (ref >39)
LDL Chol Calc (NIH): 175 mg/dL — ABNORMAL HIGH (ref 0–99)
Triglycerides: 66 mg/dL (ref 0–149)
VLDL Cholesterol Cal: 10 mg/dL (ref 5–40)

## 2021-01-22 DIAGNOSIS — G5602 Carpal tunnel syndrome, left upper limb: Secondary | ICD-10-CM | POA: Diagnosis not present

## 2021-02-05 DIAGNOSIS — G5602 Carpal tunnel syndrome, left upper limb: Secondary | ICD-10-CM | POA: Diagnosis not present

## 2021-02-05 DIAGNOSIS — Z4789 Encounter for other orthopedic aftercare: Secondary | ICD-10-CM | POA: Insufficient documentation

## 2021-03-20 ENCOUNTER — Other Ambulatory Visit: Payer: Self-pay | Admitting: Nurse Practitioner

## 2021-03-30 ENCOUNTER — Other Ambulatory Visit: Payer: Self-pay

## 2021-03-30 ENCOUNTER — Encounter: Payer: Self-pay | Admitting: Nurse Practitioner

## 2021-03-30 ENCOUNTER — Ambulatory Visit: Payer: BC Managed Care – PPO | Admitting: Nurse Practitioner

## 2021-03-30 ENCOUNTER — Ambulatory Visit (INDEPENDENT_AMBULATORY_CARE_PROVIDER_SITE_OTHER): Payer: BC Managed Care – PPO

## 2021-03-30 VITALS — BP 136/80 | HR 62 | Temp 96.6°F | Ht 62.0 in | Wt 161.6 lb

## 2021-03-30 DIAGNOSIS — M8588 Other specified disorders of bone density and structure, other site: Secondary | ICD-10-CM | POA: Diagnosis not present

## 2021-03-30 DIAGNOSIS — K219 Gastro-esophageal reflux disease without esophagitis: Secondary | ICD-10-CM

## 2021-03-30 DIAGNOSIS — I152 Hypertension secondary to endocrine disorders: Secondary | ICD-10-CM | POA: Diagnosis not present

## 2021-03-30 DIAGNOSIS — E1159 Type 2 diabetes mellitus with other circulatory complications: Secondary | ICD-10-CM

## 2021-03-30 DIAGNOSIS — Z6829 Body mass index (BMI) 29.0-29.9, adult: Secondary | ICD-10-CM

## 2021-03-30 DIAGNOSIS — Z780 Asymptomatic menopausal state: Secondary | ICD-10-CM | POA: Diagnosis not present

## 2021-03-30 DIAGNOSIS — E559 Vitamin D deficiency, unspecified: Secondary | ICD-10-CM

## 2021-03-30 DIAGNOSIS — E785 Hyperlipidemia, unspecified: Secondary | ICD-10-CM

## 2021-03-30 DIAGNOSIS — E119 Type 2 diabetes mellitus without complications: Secondary | ICD-10-CM | POA: Diagnosis not present

## 2021-03-30 LAB — BAYER DCA HB A1C WAIVED: HB A1C (BAYER DCA - WAIVED): 5.9 % (ref <7.0)

## 2021-03-30 MED ORDER — METFORMIN HCL 500 MG PO TABS: ORAL_TABLET | ORAL | 1 refills | Status: DC

## 2021-03-30 MED ORDER — LOSARTAN POTASSIUM 100 MG PO TABS: 50.0000 mg | ORAL_TABLET | Freq: Every day | ORAL | 1 refills | Status: DC

## 2021-03-30 NOTE — Progress Notes (Signed)
Subjective:    Patient ID: Katherine Ryan, female    DOB: June 09, 1956, 65 y.o.   MRN: [id number]   Chief Complaint: \medical management of chronic issues    HPI:  1. Hypertension associated with diabetes (Katherine Ryan) No c/o chest pain, sob or headache. Does not check blood pressure at home. BP Readings from Last 3 Encounters:  03/30/21 136/80  12/18/20 128/64  09/05/20 135/66     2. Type 2 diabetes mellitus without complication, without long-term current use of insulin (HCC) She has not been check ing her blood sugars when she feel bad. Lab Results  Component Value Date   HGBA1C 5.9 12/18/2020     3. Hyperlipidemia with target LDL less than 100 Has not been watching diet and doing no dedicated exercise. Lab Results  Component Value Date   CHOL 290 (H) 12/18/2020   HDL 105 12/18/2020   LDLCALC 175 (H) 12/18/2020   TRIG 66 12/18/2020   CHOLHDL 2.8 12/18/2020     4. Gastroesophageal reflux disease without esophagitis She is currently not on any meds for this.  Only does OTC meds as needed.   5. Vitamin D deficiency Is on dialy vitamind sup-plement  6. Osteopenia of lumbar spine Does no weight bearing exercise 05/04/18. She will repeat dexascan today while here.  7. BMI 27.0-27.9,adult Weight is up 12 lbs since last visit. Wt Readings from Last 3 Encounters:  03/30/21 161 lb 9.6 oz (73.3 kg)  12/18/20 149 lb (67.6 kg)  09/05/20 144 lb (65.3 kg)   BMI Readings from Last 3 Encounters:  03/30/21 29.56 kg/m  12/18/20 27.25 kg/m  09/05/20 26.34 kg/m       Outpatient Encounter Medications as of 03/30/2021  Medication Sig  . Biotin-D POWD by Does not apply route.  . Calcium Carbonate (CALCIUM 500 PO) Take by mouth.  . Cholecalciferol (VITAMIN D) 50 MCG (2000 UT) CAPS Take 1 capsule (2,000 Units total) by mouth daily.  . cyclobenzaprine (FLEXERIL) 5 MG tablet Take 1 tablet by mouth three times daily as needed for muscle spasm  . ibuprofen (ADVIL,MOTRIN) 200 MG  tablet Take 200 mg by mouth every 6 (six) hours as needed.  Marland Kitchen losartan (COZAAR) 100 MG tablet Take 0.5 tablets (50 mg total) by mouth daily.  . Magnesium 200 MG TABS Take by mouth.  . metFORMIN (GLUCOPHAGE) 500 MG tablet TAKE ONE TABLET BY MOUTH ONCE DAILY WITH  BREAKFAST   No facility-administered encounter medications on file as of 03/30/2021.    Past Surgical History:  Procedure Laterality Date  . BELPHAROPTOSIS REPAIR Right 07/23/2019  . CARPAL TUNNEL RELEASE Right   . CATARACT PEDIATRIC     right    Family History  Problem Relation Age of Onset  . Diabetes Mother   . Hypertension Mother   . Asthma Father   . Arthritis Father     New complaints: None today  Social history: Lives with her husband- wants to retire at the end of this year  Controlled substance contract: *n/a**    Review of Systems  Constitutional: Negative for diaphoresis.  Eyes: Negative for pain.  Respiratory: Negative for shortness of breath.   Cardiovascular: Negative for chest pain, palpitations and leg swelling.  Gastrointestinal: Negative for abdominal pain.  Endocrine: Negative for polydipsia.  Skin: Negative for rash.  Neurological: Negative for dizziness, weakness and headaches.  Hematological: Does not bruise/bleed easily.  All other systems reviewed and are negative.      Objective:   Physical Exam Vitals  and nursing note reviewed.  Constitutional:      General: She is not in acute distress.    Appearance: Normal appearance. She is well-developed.  HENT:     Head: Normocephalic.     Nose: Nose normal.  Eyes:     Pupils: Pupils are equal, round, and reactive to light.  Neck:     Vascular: No carotid bruit or JVD.  Cardiovascular:     Rate and Rhythm: Normal rate and regular rhythm.     Heart sounds: Normal heart sounds.  Pulmonary:     Effort: Pulmonary effort is normal. No respiratory distress.     Breath sounds: Normal breath sounds. No wheezing or rales.  Chest:      Chest wall: No tenderness.  Abdominal:     General: Bowel sounds are normal. There is no distension or abdominal bruit.     Palpations: Abdomen is soft. There is no hepatomegaly, splenomegaly, mass or pulsatile mass.     Tenderness: There is no abdominal tenderness.  Musculoskeletal:        General: Normal range of motion.     Cervical back: Normal range of motion and neck supple.  Lymphadenopathy:     Cervical: No cervical adenopathy.  Skin:    General: Skin is warm and dry.  Neurological:     Mental Status: She is alert and oriented to person, place, and time.     Deep Tendon Reflexes: Reflexes are normal and symmetric.  Psychiatric:        Behavior: Behavior normal.        Thought Content: Thought content normal.        Judgment: Judgment normal.     BP 136/80   Pulse 62   Temp (!) 96.6 F (35.9 C) (Temporal)   Ht '5\' 2"'  (1.575 m)   Wt 161 lb 9.6 oz (73.3 kg)   LMP 04/04/2005   SpO2 95%   BMI 29.56 kg/m        Assessment & Plan:  Katherine Ryan comes in today with chief complaint of Medical Management of Chronic Issues   Diagnosis and orders addressed:  1. Hypertension associated with diabetes (Katherine Ryan) Ow sodium diet - CBC with Differential/Platelet - CMP14+EGFR - losartan (COZAAR) 100 MG tablet; Take 0.5 tablets (50 mg total) by mouth daily.  Dispense: 90 tablet; Refill: 1  2. Type 2 diabetes mellitus without complication, without long-term current use of insulin (HCC) Continue to watch carbs in diet - Bayer DCA Hb A1c Waived - metFORMIN (GLUCOPHAGE) 500 MG tablet; TAKE ONE TABLET BY MOUTH ONCE DAILY WITH  BREAKFAST  Dispense: 90 tablet; Refill: 1  3. Hyperlipidemia with target LDL less than 100 Low fat diet - Lipid panel  4. Gastroesophageal reflux disease without esophagitis Avoid spicy foods Do not eat 2 hours prior to bedtime  5. Vitamin D deficiency Continue vitamin d supplement  6. Osteopenia of lumbar spine Weight bearing exercises - DG [facility]  DEXA  7. BMI 29.0-29.9,adult Discussed diet and exercise for person with BMI >25 Will recheck weight in 3-6 months   Labs pending Health Maintenance reviewed Diet and exercise encouraged  Follow up plan: 3 months   [name], FNP

## 2021-03-30 NOTE — Patient Instructions (Signed)

## 2021-03-31 LAB — CBC WITH DIFFERENTIAL/PLATELET
Basophils Absolute: 0 10*3/uL (ref 0.0–0.2)
Basos: 1 %
EOS (ABSOLUTE): 0.2 10*3/uL (ref 0.0–0.4)
Eos: 4 %
Hematocrit: 41.1 % (ref 34.0–46.6)
Hemoglobin: 14.1 g/dL (ref 11.1–15.9)
Immature Grans (Abs): 0 10*3/uL (ref 0.0–0.1)
Immature Granulocytes: 0 %
Lymphocytes Absolute: 1.7 10*3/uL (ref 0.7–3.1)
Lymphs: 34 %
MCH: 30.9 pg (ref 26.6–33.0)
MCHC: 34.3 g/dL (ref 31.5–35.7)
MCV: 90 fL (ref 79–97)
Monocytes Absolute: 0.6 10*3/uL (ref 0.1–0.9)
Monocytes: 11 %
Neutrophils Absolute: 2.5 10*3/uL (ref 1.4–7.0)
Neutrophils: 50 %
Platelets: 218 10*3/uL (ref 150–450)
RBC: 4.57 x10E6/uL (ref 3.77–5.28)
RDW: 12.9 % (ref 11.7–15.4)
WBC: 5.1 10*3/uL (ref 3.4–10.8)

## 2021-03-31 LAB — CMP14+EGFR
ALT: 21 IU/L (ref 0–32)
AST: 25 IU/L (ref 0–40)
Albumin/Globulin Ratio: 1.8 (ref 1.2–2.2)
Albumin: 4.6 g/dL (ref 3.8–4.8)
Alkaline Phosphatase: 81 IU/L (ref 44–121)
BUN/Creatinine Ratio: 24 (ref 12–28)
BUN: 17 mg/dL (ref 8–27)
Bilirubin Total: 0.3 mg/dL (ref 0.0–1.2)
CO2: 24 mmol/L (ref 20–29)
Calcium: 9.4 mg/dL (ref 8.7–10.3)
Chloride: 102 mmol/L (ref 96–106)
Creatinine, Ser: 0.72 mg/dL (ref 0.57–1.00)
Globulin, Total: 2.6 g/dL (ref 1.5–4.5)
Glucose: 107 mg/dL — ABNORMAL HIGH (ref 65–99)
Potassium: 4.8 mmol/L (ref 3.5–5.2)
Sodium: 140 mmol/L (ref 134–144)
Total Protein: 7.2 g/dL (ref 6.0–8.5)
eGFR: 93 mL/min/{1.73_m2} (ref >59)

## 2021-03-31 LAB — LIPID PANEL
Chol/HDL Ratio: 3.1 ratio (ref 0.0–4.4)
Cholesterol, Total: 283 mg/dL — ABNORMAL HIGH (ref 100–199)
HDL: 90 mg/dL (ref >39)
LDL Chol Calc (NIH): 184 mg/dL — ABNORMAL HIGH (ref 0–99)
Triglycerides: 61 mg/dL (ref 0–149)
VLDL Cholesterol Cal: 9 mg/dL (ref 5–40)

## 2021-05-21 DIAGNOSIS — M79674 Pain in right toe(s): Secondary | ICD-10-CM | POA: Diagnosis not present

## 2021-05-21 DIAGNOSIS — M2041 Other hammer toe(s) (acquired), right foot: Secondary | ICD-10-CM | POA: Diagnosis not present

## 2021-06-02 ENCOUNTER — Other Ambulatory Visit: Payer: Self-pay | Admitting: Nurse Practitioner

## 2021-06-17 DIAGNOSIS — S0571XD Avulsion of right eye, subsequent encounter: Secondary | ICD-10-CM | POA: Diagnosis not present

## 2021-07-02 ENCOUNTER — Ambulatory Visit: Payer: BC Managed Care – PPO | Admitting: Nurse Practitioner

## 2021-07-02 ENCOUNTER — Encounter: Payer: Self-pay | Admitting: Nurse Practitioner

## 2021-07-02 ENCOUNTER — Other Ambulatory Visit: Payer: Self-pay

## 2021-07-02 VITALS — BP 149/78 | HR 64 | Temp 97.1°F | Resp 20 | Ht 62.0 in | Wt 170.0 lb

## 2021-07-02 DIAGNOSIS — E119 Type 2 diabetes mellitus without complications: Secondary | ICD-10-CM | POA: Diagnosis not present

## 2021-07-02 DIAGNOSIS — E1159 Type 2 diabetes mellitus with other circulatory complications: Secondary | ICD-10-CM

## 2021-07-02 DIAGNOSIS — E559 Vitamin D deficiency, unspecified: Secondary | ICD-10-CM

## 2021-07-02 DIAGNOSIS — Z6833 Body mass index (BMI) 33.0-33.9, adult: Secondary | ICD-10-CM

## 2021-07-02 DIAGNOSIS — E785 Hyperlipidemia, unspecified: Secondary | ICD-10-CM | POA: Diagnosis not present

## 2021-07-02 DIAGNOSIS — K219 Gastro-esophageal reflux disease without esophagitis: Secondary | ICD-10-CM | POA: Diagnosis not present

## 2021-07-02 DIAGNOSIS — I152 Hypertension secondary to endocrine disorders: Secondary | ICD-10-CM

## 2021-07-02 DIAGNOSIS — M8588 Other specified disorders of bone density and structure, other site: Secondary | ICD-10-CM

## 2021-07-02 LAB — BAYER DCA HB A1C WAIVED: HB A1C (BAYER DCA - WAIVED): 5.8 % (ref <7.0)

## 2021-07-02 MED ORDER — LOSARTAN POTASSIUM 100 MG PO TABS: 50.0000 mg | ORAL_TABLET | Freq: Every day | ORAL | 1 refills | Status: DC

## 2021-07-02 MED ORDER — METFORMIN HCL 500 MG PO TABS: ORAL_TABLET | ORAL | 1 refills | Status: DC

## 2021-07-02 NOTE — Progress Notes (Signed)
Subjective:    Patient ID: Katherine Ryan, female    DOB: [birth year], 65 y.o.   MRN: [id number]   Chief Complaint: Medical Management of Chronic Issues    HPI:  1. Hypertension associated with diabetes ([name]) No c/o chest pain, sob or headache. Does not check blood pressure at home. BP Readings from Last 3 Encounters:  07/02/21 (!) 149/78  03/30/21 136/80  12/18/20 128/64     2. Type 2 diabetes mellitus without complication, without long-term current use of insulin (HCC) She does not check her blood sugars at home. Says she eats a lot of fruit. Lab Results  Component Value Date   HGBA1C 5.9 03/30/2021      3. Hyperlipidemia with target LDL less than 100 Does no dedicated exercise. Says she does not eat a lot of fried foods. Lab Results  Component Value Date   CHOL 283 (H) 03/30/2021   HDL 90 03/30/2021   LDLCALC 184 (H) 03/30/2021   TRIG 61 03/30/2021   CHOLHDL 3.1 03/30/2021     4. Gastroesophageal reflux disease without esophagitis Is on no prescription meds. No symptoms lately  5. Osteopenia of lumbar spine Last dexascan was done 03/30/21. T score was -1.4. SHe does no weight bearing exercise.   6. Vitamin D deficiency Is on daly vitamin d supplement  7. BMI 33.0-33.9,adult Weight is up 9lbs Wt Readings from Last 3 Encounters:  07/02/21 170 lb (77.1 kg)  03/30/21 161 lb 9.6 oz (73.3 kg)  12/18/20 149 lb (67.6 kg)   BMI Readings from Last 3 Encounters:  07/02/21 31.09 kg/m  03/30/21 29.56 kg/m  12/18/20 27.25 kg/m       Outpatient Encounter Medications as of 07/02/2021  Medication Sig   Biotin-D POWD by Does not apply route.   Calcium Carbonate (CALCIUM 500 PO) Take by mouth.   Cholecalciferol (VITAMIN D) 50 MCG (2000 UT) CAPS Take 1 capsule (2,000 Units total) by mouth daily.   cyclobenzaprine (FLEXERIL) 5 MG tablet Take 1 tablet by mouth three times daily as needed for muscle spasm   ibuprofen (ADVIL,MOTRIN) 200 MG tablet Take 200 mg by mouth  every 6 (six) hours as needed.   losartan (COZAAR) 100 MG tablet Take 0.5 tablets (50 mg total) by mouth daily.   Magnesium 200 MG TABS Take by mouth.   metFORMIN (GLUCOPHAGE) 500 MG tablet TAKE ONE TABLET BY MOUTH ONCE DAILY WITH  BREAKFAST   No facility-administered encounter medications on file as of 07/02/2021.    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 His dementia is worsening and it makes it difficult to leave him to work.  Controlled substance contract: n/a     Review of Systems  Constitutional:  Negative for diaphoresis.  Eyes:  Negative for pain.  Respiratory:  Negative for shortness of breath.   Cardiovascular:  Negative for chest pain, palpitations and leg swelling.  Gastrointestinal:  Negative for abdominal pain.  Endocrine: Negative for polydipsia.  Skin:  Negative for rash.  Neurological:  Negative for dizziness, weakness and headaches.  Hematological:  Does not bruise/bleed easily.  All other systems reviewed and are negative.     Objective:   Physical Exam Vitals and nursing note reviewed.  Constitutional:      General: She is not in acute distress.    Appearance: Normal appearance. She is well-developed.  HENT:     Head: Normocephalic.     Right Ear: Tympanic membrane normal.     Left Ear: Tympanic membrane normal.     Nose: Nose normal.     Mouth/Throat:     Mouth: Mucous membranes are moist.  Eyes:     Pupils: Pupils are equal, round, and reactive to light.  Neck:     Vascular: No carotid bruit or JVD.  Cardiovascular:     Rate and Rhythm: Normal rate and regular rhythm.     Heart sounds: Normal heart  sounds.  Pulmonary:     Effort: Pulmonary effort is normal. No respiratory distress.     Breath sounds: Normal breath sounds. No wheezing or rales.  Chest:     Chest wall: No tenderness.  Abdominal:     General: Bowel sounds are normal. There is no distension or abdominal bruit.     Palpations: Abdomen is soft. There is no hepatomegaly, splenomegaly, mass or pulsatile mass.     Tenderness: There is no abdominal tenderness.  Musculoskeletal:        General: Normal range of motion.     Cervical back: Normal range of motion and neck supple.  Lymphadenopathy:     Cervical: No cervical adenopathy.  Skin:    General: Skin is warm and dry.  Neurological:     Mental Status: She is alert and oriented to person, place, and time.     Deep Tendon Reflexes: Reflexes are normal and symmetric.  Psychiatric:        Behavior: Behavior normal.        Thought Content: Thought content normal.        Judgment: Judgment normal.   BP (!) 149/78   Pulse 64   Temp (!) 97.1 F (36.2 C) (Temporal)   Resp 20   Ht _0  (1.575 m)   Wt 170 lb (77.1 kg)   LMP 04/04/2005   SpO2 98%   BMI 31.09 kg/m   HGBA1c 5.8%       Assessment & Plan:   Katherine Ryan comes in today with chief complaint of Medical Management of Chronic Issues   Diagnosis and orders addressed:  1. Hypertension associated with diabetes ([name]) Low sodium diet - CBC with Differential/Platelet - CMP14+EGFR - losartan (COZAAR) 100 MG tablet; Take 0.5 tablets (50 mg total) by mouth daily.  Dispense: 90 tablet; Refill: 1  2. Type 2 diabetes mellitus without complication, without long-term current use of insulin (HCC) Continue to watch carbs in diet - Bayer DCA Hb A1c Waived - Microalbumin / creatinine urine ratio - metFORMIN (GLUCOPHAGE) 500 MG tablet; TAKE ONE TABLET BY MOUTH ONCE DAILY WITH  BREAKFAST  Dispense: 90 tablet; Refill: 1  3. Hyperlipidemia with target LDL less than 100 Low fat diet - Lipid panel  4.  Gastroesophageal reflux disease without esophagitis Avoid spicy foods Do not eat 2 hours prior to bedtime  5. Osteopenia of lumbar spine Weight bearing exercises encouraged  6. Vitamin D deficiency Continue daily vitamin d supplement  7. BMI 33.0-33.9,adult Discussed diet and exercise for person with BMI >25 Will recheck weight in 3-6 months    Labs pending Health Maintenance reviewed Diet and exercise encouraged  Follow up  plan: 3 months    [name], FNP

## 2021-07-02 NOTE — Patient Instructions (Signed)
https://www.diabeteseducator.org/docs/default-source/living-with-diabetes/conquering-the-grocery-store-v1.pdf?sfvrsn=4">  Carbohydrate Counting for Diabetes Mellitus, Adult Carbohydrate counting is a method of keeping track of how many carbohydrates you eat. Eating carbohydrates naturally increases the amount of sugar (glucose) in the blood. Counting how many carbohydrates you eat improves your bloodglucose control, which helps you manage your diabetes. It is important to know how many carbohydrates you can safely have in each meal. This is different for every person. A dietitian can help you make a meal plan and calculate how many carbohydrates you should have at each meal andsnack. What foods contain carbohydrates? Carbohydrates are found in the following foods: Grains, such as breads and cereals. Dried beans and soy products. Starchy vegetables, such as potatoes, peas, and corn. Fruit and fruit juices. Milk and yogurt. Sweets and snack foods, such as cake, cookies, candy, chips, and soft drinks. How do I count carbohydrates in foods? There are two ways to count carbohydrates in food. You can read food labels or learn standard serving sizes of foods. You can use either of the methods or acombination of both. Using the Nutrition Facts label The Nutrition Facts list is included on the labels of almost all packaged foods and beverages in the [place] It includes: The serving size. Information about nutrients in each serving, including the grams (g) of carbohydrate per serving. To use the Nutrition Facts: Decide how many servings you will have. Multiply the number of servings by the number of carbohydrates per serving. The resulting number is the total amount of carbohydrates that you will be having. Learning the standard serving sizes of foods When you eat carbohydrate foods that are not packaged or do not include Nutrition Facts on the label, you need to measure the servings in order to count the  amount of carbohydrates. Measure the foods that you will eat with a food scale or measuring cup, if needed. Decide how many standard-size servings you will eat. Multiply the number of servings by 15. For foods that contain carbohydrates, one serving equals 15 g of carbohydrates. For example, if you eat 2 cups or 10 oz (300 g) of strawberries, you will have eaten 2 servings and 30 g of carbohydrates (2 servings x 15 g = 30 g). For foods that have more than one food mixed, such as soups and casseroles, you must count the carbohydrates in each food that is included. The following list contains standard serving sizes of common carbohydrate-rich foods. Each of these servings has about 15 g of carbohydrates: 1 slice of bread. 1 six-inch (15 cm) tortilla. ? cup or 2 oz (53 g) cooked rice or pasta.  cup or 3 oz (85 g) cooked or canned, drained and rinsed beans or lentils.  cup or 3 oz (85 g) starchy vegetable, such as peas, corn, or squash.  cup or 4 oz (120 g) hot cereal.  cup or 3 oz (85 g) boiled or mashed potatoes, or  or 3 oz (85 g) of a large baked potato.  cup or 4 fl oz (118 mL) fruit juice. 1 cup or 8 fl oz (237 mL) milk. 1 small or 4 oz (106 g) apple.  or 2 oz (63 g) of a medium banana. 1 cup or 5 oz (150 g) strawberries. 3 cups or 1 oz (24 g) popped popcorn. What is an example of carbohydrate counting? To calculate the number of carbohydrates in this sample meal, follow the stepsshown below. Sample meal 3 oz (85 g) chicken breast. ? cup or 4 oz (106 g) brown rice.    cup or 3 oz (85 g) corn. 1 cup or 8 fl oz (237 mL) milk. 1 cup or 5 oz (150 g) strawberries with sugar-free whipped topping. Carbohydrate calculation Identify the foods that contain carbohydrates: Rice. Corn. Milk. Strawberries. Calculate how many servings you have of each food: 2 servings rice. 1 serving corn. 1 serving milk. 1 serving strawberries. Multiply each number of servings by 15 g: 2 servings  rice x 15 g = 30 g. 1 serving corn x 15 g = 15 g. 1 serving milk x 15 g = 15 g. 1 serving strawberries x 15 g = 15 g. Add together all of the amounts to find the total grams of carbohydrates eaten: 30 g + 15 g + 15 g + 15 g = 75 g of carbohydrates total. What are tips for following this plan? Shopping Develop a meal plan and then make a shopping list. Buy fresh and frozen vegetables, fresh and frozen fruit, dairy, eggs, beans, lentils, and whole grains. Look at food labels. Choose foods that have more fiber and less sugar. Avoid processed foods and foods with added sugars. Meal planning Aim to have the same amount of carbohydrates at each meal and for each snack time. Plan to have regular, balanced meals and snacks. Where to find more information American Diabetes Association: www.diabetes.org Centers for Disease Control and Prevention: www.cdc.gov Summary Carbohydrate counting is a method of keeping track of how many carbohydrates you eat. Eating carbohydrates naturally increases the amount of sugar (glucose) in the blood. Counting how many carbohydrates you eat improves your blood glucose control, which helps you manage your diabetes. A dietitian can help you make a meal plan and calculate how many carbohydrates you should have at each meal and snack. This information is not intended to replace advice given to you by your health care provider. Make sure you discuss any questions you have with your healthcare provider. Document Revised: 12/13/2019 Document Reviewed: 12/14/2019 Elsevier Patient Education  2021 Elsevier Inc.  

## 2021-07-03 LAB — CBC WITH DIFFERENTIAL/PLATELET
Basophils Absolute: 0 10*3/uL (ref 0.0–0.2)
Basos: 1 %
EOS (ABSOLUTE): 0.2 10*3/uL (ref 0.0–0.4)
Eos: 3 %
Hematocrit: 40.8 % (ref 34.0–46.6)
Hemoglobin: 13.6 g/dL (ref 11.1–15.9)
Immature Grans (Abs): 0 10*3/uL (ref 0.0–0.1)
Immature Granulocytes: 0 %
Lymphocytes Absolute: 1.6 10*3/uL (ref 0.7–3.1)
Lymphs: 32 %
MCH: 30.4 pg (ref 26.6–33.0)
MCHC: 33.3 g/dL (ref 31.5–35.7)
MCV: 91 fL (ref 79–97)
Monocytes Absolute: 0.5 10*3/uL (ref 0.1–0.9)
Monocytes: 11 %
Neutrophils Absolute: 2.6 10*3/uL (ref 1.4–7.0)
Neutrophils: 53 %
Platelets: 237 10*3/uL (ref 150–450)
RBC: 4.48 x10E6/uL (ref 3.77–5.28)
RDW: 13.1 % (ref 11.7–15.4)
WBC: 4.9 10*3/uL (ref 3.4–10.8)

## 2021-07-03 LAB — LIPID PANEL
Chol/HDL Ratio: 3.1 ratio (ref 0.0–4.4)
Cholesterol, Total: 288 mg/dL — ABNORMAL HIGH (ref 100–199)
HDL: 94 mg/dL (ref >39)
LDL Chol Calc (NIH): 185 mg/dL — ABNORMAL HIGH (ref 0–99)
Triglycerides: 62 mg/dL (ref 0–149)
VLDL Cholesterol Cal: 9 mg/dL (ref 5–40)

## 2021-07-03 LAB — CMP14+EGFR
ALT: 30 IU/L (ref 0–32)
AST: 25 IU/L (ref 0–40)
Albumin/Globulin Ratio: 1.7 (ref 1.2–2.2)
Albumin: 4.7 g/dL (ref 3.8–4.8)
Alkaline Phosphatase: 85 IU/L (ref 44–121)
BUN/Creatinine Ratio: 22 (ref 12–28)
BUN: 16 mg/dL (ref 8–27)
Bilirubin Total: 0.2 mg/dL (ref 0.0–1.2)
CO2: 24 mmol/L (ref 20–29)
Calcium: 9.4 mg/dL (ref 8.7–10.3)
Chloride: 101 mmol/L (ref 96–106)
Creatinine, Ser: 0.73 mg/dL (ref 0.57–1.00)
Globulin, Total: 2.8 g/dL (ref 1.5–4.5)
Glucose: 108 mg/dL — ABNORMAL HIGH (ref 65–99)
Potassium: 4.1 mmol/L (ref 3.5–5.2)
Sodium: 140 mmol/L (ref 134–144)
Total Protein: 7.5 g/dL (ref 6.0–8.5)
eGFR: 92 mL/min/{1.73_m2} (ref >59)

## 2021-07-03 LAB — MICROALBUMIN / CREATININE URINE RATIO
Creatinine, Urine: 7.6 mg/dL
Microalbumin, Urine: <3 ug/mL

## 2021-08-16 ENCOUNTER — Other Ambulatory Visit: Payer: Self-pay | Admitting: Nurse Practitioner

## 2021-10-16 ENCOUNTER — Encounter: Payer: Self-pay | Admitting: Nurse Practitioner

## 2021-10-16 ENCOUNTER — Other Ambulatory Visit: Payer: Self-pay

## 2021-10-16 ENCOUNTER — Ambulatory Visit (INDEPENDENT_AMBULATORY_CARE_PROVIDER_SITE_OTHER): Payer: Medicare HMO | Admitting: Nurse Practitioner

## 2021-10-16 VITALS — BP 136/82 | HR 68 | Temp 96.3°F | Resp 20 | Ht 62.0 in | Wt 179.0 lb

## 2021-10-16 DIAGNOSIS — Z6833 Body mass index (BMI) 33.0-33.9, adult: Secondary | ICD-10-CM | POA: Diagnosis not present

## 2021-10-16 DIAGNOSIS — E1159 Type 2 diabetes mellitus with other circulatory complications: Secondary | ICD-10-CM | POA: Diagnosis not present

## 2021-10-16 DIAGNOSIS — I152 Hypertension secondary to endocrine disorders: Secondary | ICD-10-CM

## 2021-10-16 DIAGNOSIS — E785 Hyperlipidemia, unspecified: Secondary | ICD-10-CM | POA: Diagnosis not present

## 2021-10-16 DIAGNOSIS — E119 Type 2 diabetes mellitus without complications: Secondary | ICD-10-CM | POA: Diagnosis not present

## 2021-10-16 DIAGNOSIS — K219 Gastro-esophageal reflux disease without esophagitis: Secondary | ICD-10-CM | POA: Diagnosis not present

## 2021-10-16 DIAGNOSIS — E559 Vitamin D deficiency, unspecified: Secondary | ICD-10-CM | POA: Diagnosis not present

## 2021-10-16 LAB — BAYER DCA HB A1C WAIVED: HB A1C (BAYER DCA - WAIVED): 6.3 % — ABNORMAL HIGH (ref 4.8–5.6)

## 2021-10-16 NOTE — Patient Instructions (Signed)

## 2021-10-16 NOTE — Progress Notes (Signed)
Subjective:    Patient ID: Katherine Ryan, female    DOB: 27-Dec-1956, 65 y.o.   MRN: [id number]  Chief Complaint: medical management of chronic issues     HPI:  1. Hypertension associated with diabetes ([place]) No c/o chest pain, sob or headache. Does not check blood pressure at home. BP Readings from Last 3 Encounters:  10/16/21 (!) 151/80  07/02/21 (!) 149/78  03/30/21 136/80      2. Hyperlipidemia with target LDL less than 100 Does try to watch diet biut does no exercise. Lab Results  Component Value Date   CHOL 288 (H) 07/02/2021   HDL 94 07/02/2021   LDLCALC 185 (H) 07/02/2021   TRIG 62 07/02/2021   CHOLHDL 3.1 07/02/2021     3. Type 2 diabetes mellitus without complication, without long-term current use of insulin (HCC) She has not been checking her blood sugars  Lab Results  Component Value Date   HGBA1C 5.8 07/02/2021     4. Gastroesophageal reflux disease without esophagitis No problems as long as she does not overeat.  5. Vitamin D deficiency Is on daily vitamin d supplement  6. BMI 33.0-33.9,adult Weight was up 9lbs Wt Readings from Last 3 Encounters:  10/16/21 179 lb (81.2 kg)  07/02/21 170 lb (77.1 kg)  03/30/21 161 lb 9.6 oz (73.3 kg)   BMI Readings from Last 3 Encounters:  10/16/21 32.74 kg/m  07/02/21 31.09 kg/m  03/30/21 29.56 kg/m      Outpatient Encounter Medications as of 10/16/2021  Medication Sig   Biotin-D POWD by Does not apply route.   Calcium Carbonate (CALCIUM 500 PO) Take by mouth.   Cholecalciferol (VITAMIN D) 50 MCG (2000 UT) CAPS Take 1 capsule (2,000 Units total) by mouth daily.   cyclobenzaprine (FLEXERIL) 5 MG tablet Take 1 tablet by mouth three times daily as needed for muscle spasm   ibuprofen (ADVIL,MOTRIN) 200 MG tablet Take 200 mg by mouth every 6 (six) hours as needed.   losartan (COZAAR) 100 MG tablet Take 0.5 tablets (50 mg total) by mouth daily.   Magnesium 200 MG TABS Take by mouth.   metFORMIN  (GLUCOPHAGE) 500 MG tablet TAKE ONE TABLET BY MOUTH ONCE DAILY WITH  BREAKFAST   No facility-administered encounter medications on file as of 10/16/2021.    Past Surgical History:  Procedure Laterality Date   BELPHAROPTOSIS REPAIR Right 07/23/2019   CARPAL TUNNEL RELEASE Right    CATARACT PEDIATRIC     right    Family History  Problem Relation Age of Onset   Diabetes Mother    Hypertension Mother    Asthma Father    Arthritis Father     New complaints: None today  Social history: Lives with husband who has dementia. She retired last week  Controlled substance contract: n/a      Review of Systems  Constitutional:  Negative for diaphoresis.  Eyes:  Negative for pain.  Respiratory:  Negative for shortness of breath.   Cardiovascular:  Negative for chest pain, palpitations and leg swelling.  Gastrointestinal:  Negative for abdominal pain.  Endocrine: Negative for polydipsia.  Skin:  Negative for rash.  Neurological:  Negative for dizziness, weakness and headaches.  Hematological:  Does not bruise/bleed easily.  All other systems reviewed and are negative.     Objective:   Physical Exam Vitals and nursing note reviewed.  Constitutional:      General: She is not in acute distress.    Appearance: Normal appearance. She is well-developed.  HENT:     Head: Normocephalic.     Right Ear: Tympanic membrane normal.     Left Ear: Tympanic membrane normal.     Nose: Nose normal.     Mouth/Throat:     Mouth: Mucous membranes are moist.  Eyes:     Pupils: Pupils are equal, round, and reactive to light.  Neck:     Vascular: No carotid bruit or JVD.     Comments: Right eye is artificial Cardiovascular:     Rate and Rhythm: Normal rate and regular rhythm.     Heart sounds: Normal heart sounds.  Pulmonary:     Effort: Pulmonary effort is normal. No respiratory distress.     Breath sounds: Normal breath sounds. No wheezing or rales.  Chest:     Chest wall: No  tenderness.  Abdominal:     General: Bowel sounds are normal. There is no distension or abdominal bruit.     Palpations: Abdomen is soft. There is no hepatomegaly, splenomegaly, mass or pulsatile mass.     Tenderness: There is no abdominal tenderness.  Musculoskeletal:        General: Normal range of motion.     Cervical back: Normal range of motion and neck supple.  Lymphadenopathy:     Cervical: No cervical adenopathy.  Skin:    General: Skin is warm and dry.  Neurological:     Mental Status: She is alert and oriented to person, place, and time.     Deep Tendon Reflexes: Reflexes are normal and symmetric.  Psychiatric:        Behavior: Behavior normal.        Thought Content: Thought content normal.        Judgment: Judgment normal.    BP 136/82   Pulse 68   Temp (!) 96.3 F (35.7 C) (Temporal)   Resp 20   Ht _0  (1.575 m)   Wt 179 lb (81.2 kg)   LMP 04/04/2005   SpO2 97%   BMI 32.74 kg/m   HGBA1c 6.3%       Assessment & Plan:   Katherine Ryan comes in today with chief complaint of Medical Management of Chronic Issues   Diagnosis and orders addressed:  1. Hypertension associated with diabetes ([place]) Low sodium diet - CBC with Differential/Platelet - CMP14+EGFR  2. Hyperlipidemia with target LDL less than 100 Low fat diet - Lipid panel  3. Type 2 diabetes mellitus without complication, without long-term current use of insulin (HCC) Watch carbs in deit - Bayer DCA Hb A1c Waived  4. Gastroesophageal reflux disease without esophagitis Avoid spicy foods Do not eat 2 hours prior to bedtime   5. Vitamin D deficiency Continue daily vitamin d suppleemnt  6. BMI 33.0-33.9,adult Discussed diet and exercise for person with BMI >25 Will recheck weight in 3-6 months    Labs pending Health Maintenance reviewed Diet and exercise encouraged  Follow up plan: 3 months   [name], FNP

## 2021-10-17 LAB — CBC WITH DIFFERENTIAL/PLATELET
Basophils Absolute: 0 10*3/uL (ref 0.0–0.2)
Basos: 1 %
EOS (ABSOLUTE): 0.1 10*3/uL (ref 0.0–0.4)
Eos: 2 %
Hematocrit: 43.4 % (ref 34.0–46.6)
Hemoglobin: 14.5 g/dL (ref 11.1–15.9)
Immature Grans (Abs): 0 10*3/uL (ref 0.0–0.1)
Immature Granulocytes: 0 %
Lymphocytes Absolute: 1.7 10*3/uL (ref 0.7–3.1)
Lymphs: 30 %
MCH: 29.8 pg (ref 26.6–33.0)
MCHC: 33.4 g/dL (ref 31.5–35.7)
MCV: 89 fL (ref 79–97)
Monocytes Absolute: 0.6 10*3/uL (ref 0.1–0.9)
Monocytes: 11 %
Neutrophils Absolute: 3.2 10*3/uL (ref 1.4–7.0)
Neutrophils: 56 %
Platelets: 255 10*3/uL (ref 150–450)
RBC: 4.87 x10E6/uL (ref 3.77–5.28)
RDW: 13.2 % (ref 11.7–15.4)
WBC: 5.6 10*3/uL (ref 3.4–10.8)

## 2021-10-17 LAB — LIPID PANEL
Chol/HDL Ratio: 3.8 ratio (ref 0.0–4.4)
Cholesterol, Total: 313 mg/dL — ABNORMAL HIGH (ref 100–199)
HDL: 83 mg/dL (ref >39)
LDL Chol Calc (NIH): 206 mg/dL — ABNORMAL HIGH (ref 0–99)
Triglycerides: 134 mg/dL (ref 0–149)
VLDL Cholesterol Cal: 24 mg/dL (ref 5–40)

## 2021-10-17 LAB — CMP14+EGFR
ALT: 22 IU/L (ref 0–32)
AST: 18 IU/L (ref 0–40)
Albumin/Globulin Ratio: 1.6 (ref 1.2–2.2)
Albumin: 4.7 g/dL (ref 3.8–4.8)
Alkaline Phosphatase: 78 IU/L (ref 44–121)
BUN/Creatinine Ratio: 19 (ref 12–28)
BUN: 15 mg/dL (ref 8–27)
Bilirubin Total: 0.2 mg/dL (ref 0.0–1.2)
CO2: 26 mmol/L (ref 20–29)
Calcium: 10.1 mg/dL (ref 8.7–10.3)
Chloride: 100 mmol/L (ref 96–106)
Creatinine, Ser: 0.79 mg/dL (ref 0.57–1.00)
Globulin, Total: 2.9 g/dL (ref 1.5–4.5)
Glucose: 111 mg/dL — ABNORMAL HIGH (ref 70–99)
Potassium: 4.4 mmol/L (ref 3.5–5.2)
Sodium: 138 mmol/L (ref 134–144)
Total Protein: 7.6 g/dL (ref 6.0–8.5)
eGFR: 83 mL/min/{1.73_m2} (ref >59)

## 2021-10-27 ENCOUNTER — Ambulatory Visit (INDEPENDENT_AMBULATORY_CARE_PROVIDER_SITE_OTHER): Payer: Medicare HMO | Admitting: Pharmacist

## 2021-10-27 ENCOUNTER — Other Ambulatory Visit: Payer: Self-pay

## 2021-10-27 DIAGNOSIS — E785 Hyperlipidemia, unspecified: Secondary | ICD-10-CM

## 2021-10-27 MED ORDER — EZETIMIBE 10 MG PO TABS: 10.0000 mg | ORAL_TABLET | Freq: Every day | ORAL | 5 refills | Status: DC

## 2021-10-27 NOTE — Progress Notes (Signed)
10/27/2021 Name: Katherine Ryan MRN: [id number] DOB: Aug 26, 1956   HPI:  Katherine Ryan is a 65 y.o. female patient referred to lipid clinic by PCP. PMH is significant for: Past Medical History:  Diagnosis Date   Blindness of right eye with low vision in contralateral eye    Diabetes mellitus without complication (HCC)    GERD (gastroesophageal reflux disease)    Hyperlipidemia    Current Medications: Current Outpatient Medications on File Prior to Visit  Medication Sig Dispense Refill   Biotin-D POWD by Does not apply route.     Calcium Carbonate (CALCIUM 500 PO) Take by mouth.     Cholecalciferol (VITAMIN D) 50 MCG (2000 UT) CAPS Take 1 capsule (2,000 Units total) by mouth daily. 90 capsule 1   ibuprofen (ADVIL,MOTRIN) 200 MG tablet Take 200 mg by mouth every 6 (six) hours as needed.     losartan (COZAAR) 100 MG tablet Take 0.5 tablets (50 mg total) by mouth daily. 90 tablet 1   Magnesium 200 MG TABS Take by mouth.     metFORMIN (GLUCOPHAGE) 500 MG tablet TAKE ONE TABLET BY MOUTH ONCE DAILY WITH  BREAKFAST 90 tablet 1   No current facility-administered medications on file prior to visit.   Intolerances: ATORVASTATIN 80 MG DAILY & ROSUVASTATIN 10MG  DAILY (MYOPATHY)  Risk Factors: HTN, BMI, A1C NOW 6.3%  LDL goal: <70  Diet: admits to "not eating great", she has been eating a lot of cheeses at each meal Discussed heart healthy meals and handout provided   Exercise: encouraged; n/a   Family History: n/a  Social History: Smoking: Former (Quit: 10/13/2011) Cigarettes (52.5 Pack Years)   Labs:   Lipid Panel     Component Value Date/Time   CHOL 313 (H) 10/16/2021 1134   CHOL 151 04/04/2013 1022   TRIG 134 10/16/2021 1134   TRIG 126 03/17/2015 0932   TRIG 54 04/04/2013 1022   HDL 83 10/16/2021 1134   HDL 63 03/17/2015 0932   HDL 51 04/04/2013 1022   CHOLHDL 3.8 10/16/2021 1134   LDLCALC 206 (H) 10/16/2021 1134   LDLCALC 88 08/26/2014 0923   LDLCALC 89 04/04/2013  1022   LABVLDL 24 10/16/2021 1134   CMP CMP Latest Ref Rng & Units 10/16/2021 07/02/2021 03/30/2021  Glucose 70 - 99 mg/dL 111(H) 108(H) 107(H)  BUN 8 - 27 mg/dL 15 16 17   Creatinine 0.57 - 1.00 mg/dL 0.79 0.73 0.72  Sodium 134 - 144 mmol/L 138 140 140  Potassium 3.5 - 5.2 mmol/L 4.4 4.1 4.8  Chloride 96 - 106 mmol/L 100 101 102  CO2 20 - 29 mmol/L 26 24 24   Calcium 8.7 - 10.3 mg/dL 10.1 9.4 9.4  Total Protein 6.0 - 8.5 g/dL 7.6 7.5 7.2  Total Bilirubin 0.0 - 1.2 mg/dL 0.2 0.2 0.3  Alkaline Phos 44 - 121 IU/L 78 85 81  AST 0 - 40 IU/L 18 25 25   ALT 0 - 32 IU/L 22 30 21           Assessment/Plan:    1. Hyperlipidemia - -Discussed statins--patient refused -Starting Zetia -Discussed alternative therapies (Repatha, nexlitol, etc) and alternative dosing strategies with statins--patient would like to try zetia only at this time (since generic);   -Diet/lifestyle modifications encouraged and discussed -Discussed with patient that zetia alone is not likely to get her to goal  Follow up [facility] Visit in January for repeat labs.   Regina Eck, PharmD, BCPS Clinical Pharmacist, [facility]  II Phone [phone]

## 2021-11-11 ENCOUNTER — Encounter: Payer: Self-pay | Admitting: Pharmacist

## 2021-12-14 DIAGNOSIS — E119 Type 2 diabetes mellitus without complications: Secondary | ICD-10-CM | POA: Diagnosis not present

## 2021-12-14 DIAGNOSIS — H2512 Age-related nuclear cataract, left eye: Secondary | ICD-10-CM | POA: Diagnosis not present

## 2021-12-14 DIAGNOSIS — H5200 Hypermetropia, unspecified eye: Secondary | ICD-10-CM | POA: Diagnosis not present

## 2021-12-14 DIAGNOSIS — E7800 Pure hypercholesterolemia, unspecified: Secondary | ICD-10-CM | POA: Diagnosis not present

## 2021-12-14 DIAGNOSIS — Z0100 Encounter for examination of eyes and vision without abnormal findings: Secondary | ICD-10-CM | POA: Diagnosis not present

## 2021-12-14 LAB — HM DIABETES EYE EXAM

## 2022-01-03 ENCOUNTER — Other Ambulatory Visit: Payer: Self-pay | Admitting: Nurse Practitioner

## 2022-01-03 DIAGNOSIS — E559 Vitamin D deficiency, unspecified: Secondary | ICD-10-CM

## 2022-01-03 DIAGNOSIS — E119 Type 2 diabetes mellitus without complications: Secondary | ICD-10-CM

## 2022-01-14 ENCOUNTER — Encounter: Payer: Self-pay | Admitting: Nurse Practitioner

## 2022-01-14 ENCOUNTER — Ambulatory Visit (INDEPENDENT_AMBULATORY_CARE_PROVIDER_SITE_OTHER): Payer: Medicare HMO | Admitting: Nurse Practitioner

## 2022-01-14 VITALS — BP 143/70 | HR 65 | Temp 97.8°F | Resp 20 | Ht 62.0 in | Wt 191.0 lb

## 2022-01-14 DIAGNOSIS — E119 Type 2 diabetes mellitus without complications: Secondary | ICD-10-CM | POA: Diagnosis not present

## 2022-01-14 DIAGNOSIS — I152 Hypertension secondary to endocrine disorders: Secondary | ICD-10-CM | POA: Diagnosis not present

## 2022-01-14 DIAGNOSIS — M545 Low back pain, unspecified: Secondary | ICD-10-CM

## 2022-01-14 DIAGNOSIS — Z6833 Body mass index (BMI) 33.0-33.9, adult: Secondary | ICD-10-CM

## 2022-01-14 DIAGNOSIS — E1159 Type 2 diabetes mellitus with other circulatory complications: Secondary | ICD-10-CM | POA: Diagnosis not present

## 2022-01-14 DIAGNOSIS — E559 Vitamin D deficiency, unspecified: Secondary | ICD-10-CM | POA: Diagnosis not present

## 2022-01-14 DIAGNOSIS — K219 Gastro-esophageal reflux disease without esophagitis: Secondary | ICD-10-CM | POA: Diagnosis not present

## 2022-01-14 DIAGNOSIS — E785 Hyperlipidemia, unspecified: Secondary | ICD-10-CM | POA: Diagnosis not present

## 2022-01-14 LAB — BAYER DCA HB A1C WAIVED: HB A1C (BAYER DCA - WAIVED): 6.5 % — ABNORMAL HIGH (ref 4.8–5.6)

## 2022-01-14 NOTE — Progress Notes (Signed)
Subjective:    Patient ID: Katherine Ryan, female    DOB: 11-16-1956, 66 y.o.   MRN: [id number]   Chief Complaint: medical management of chronic issues     HPI:  Katherine Ryan is a 66 y.o. who identifies as a female who was assigned female at birth.   Social history: Lives with: husband- she is his caregiver Work history: retired   Scientist, forensic in today for follow up of the following chronic medical issues:  1. Hypertension associated with diabetes ([place]) No c/ochest pain, sob or headache. Does not check bloodpresure at home. BP Readings from Last 3 Encounters:  10/16/21 136/82  07/02/21 (!) 149/78  03/30/21 136/80     2. Hyperlipidemia with target LDL less than 100 Does try to watch diet and does occasional exercise. She has given up cheese which she eats a lot. She has also stopped avocados also. Wants to see if cholesterol is better. Lab Results  Component Value Date   CHOL 313 (H) 10/16/2021   HDL 83 10/16/2021   LDLCALC 206 (H) 10/16/2021   TRIG 134 10/16/2021   CHOLHDL 3.8 10/16/2021     3. Type 2 diabetes mellitus without complication, without long-term current use of insulin (HCC) Fasting blood sugars are running around 100-130. No symptoms of low blood sugars. Lab Results  Component Value Date   HGBA1C 6.3 (H) 10/16/2021     4. Gastroesophageal reflux disease without esophagitis Only uses OTC meds when needed. Very seldom has symptoms.  5. Vitamin D deficiency Is on daily vitamin d supplement  6. BMI 33.0-33.9,adult Weight is up 12lbs since last visit Wt Readings from Last 3 Encounters:  01/14/22 191 lb (86.6 kg)  10/16/21 179 lb (81.2 kg)  07/02/21 170 lb (77.1 kg)   BMI Readings from Last 3 Encounters:  01/14/22 34.93 kg/m  10/16/21 32.74 kg/m  07/02/21 31.09 kg/m      New complaints: None today  Allergies  Allergen Reactions   Asa Arthritis Strength-Antacid [Aspirin Buffered] Anaphylaxis    GI upset   Ace Inhibitors Cough    Atorvastatin Other (See Comments)   Nsaids    Outpatient Encounter Medications as of 01/14/2022  Medication Sig   Biotin-D POWD by Does not apply route.   Calcium Carbonate (CALCIUM 500 PO) Take by mouth.   Cholecalciferol (VITAMIN D) 50 MCG (2000 UT) CAPS Take 1 capsule by mouth once daily   ezetimibe (ZETIA) 10 MG tablet Take 1 tablet (10 mg total) by mouth daily.   ibuprofen (ADVIL,MOTRIN) 200 MG tablet Take 200 mg by mouth every 6 (six) hours as needed.   losartan (COZAAR) 100 MG tablet Take 0.5 tablets (50 mg total) by mouth daily.   Magnesium 200 MG TABS Take by mouth.   metFORMIN (GLUCOPHAGE) 500 MG tablet Take 1 tablet by mouth once daily with breakfast   No facility-administered encounter medications on file as of 01/14/2022.    Past Surgical History:  Procedure Laterality Date   BELPHAROPTOSIS REPAIR Right 07/23/2019   CARPAL TUNNEL RELEASE Right    CATARACT PEDIATRIC     right    Family History  Problem Relation Age of Onset   Diabetes Mother    Hypertension Mother    Asthma Father    Arthritis Father       Controlled substance contract: n/a     Review of Systems  Constitutional:  Negative for diaphoresis.  Eyes:  Negative for pain.  Respiratory:  Negative for shortness of breath.   Cardiovascular:  Negative for chest pain, palpitations and leg swelling.  Gastrointestinal:  Negative for abdominal pain.  Endocrine: Negative for polydipsia.  Skin:  Negative for rash.  Neurological:  Negative for dizziness, weakness and headaches.  Hematological:  Does not bruise/bleed easily.  All other systems reviewed and are negative.     Objective:   Physical Exam Vitals and nursing note reviewed.  Constitutional:      General: She is not in acute distress.    Appearance: Normal appearance. She is well-developed.  HENT:     Head: Normocephalic.     Right Ear: Tympanic membrane normal.     Left Ear: Tympanic membrane normal.     Nose: Nose normal.      Mouth/Throat:     Mouth: Mucous membranes are moist.  Eyes:     Pupils: Pupils are equal, round, and reactive to light.  Neck:     Vascular: No carotid bruit or JVD.  Cardiovascular:     Rate and Rhythm: Normal rate and regular rhythm.     Heart sounds: Normal heart sounds.  Pulmonary:     Effort: Pulmonary effort is normal. No respiratory distress.     Breath sounds: Normal breath sounds. No wheezing or rales.  Chest:     Chest wall: No tenderness.  Abdominal:     General: Bowel sounds are normal. There is no distension or abdominal bruit.     Palpations: Abdomen is soft. There is no hepatomegaly, splenomegaly, mass or pulsatile mass.     Tenderness: There is no abdominal tenderness.  Musculoskeletal:        General: Normal range of motion.     Cervical back: Normal range of motion and neck supple.  Lymphadenopathy:     Cervical: No cervical adenopathy.  Skin:    General: Skin is warm and dry.  Neurological:     Mental Status: She is alert and oriented to person, place, and time.     Deep Tendon Reflexes: Reflexes are normal and symmetric.  Psychiatric:        Behavior: Behavior normal.        Thought Content: Thought content normal.        Judgment: Judgment normal.   BP (!) 143/70    Pulse 65    Temp 97.8 F (36.6 C) (Temporal)    Resp 20    Ht '5\' 2"'  (1.575 m)    Wt 191 lb (86.6 kg)    LMP 04/04/2005    SpO2 98%    BMI 34.93 kg/m   Hgab1c 6.5%      Assessment & Plan:  Rashae Rother comes in today with chief complaint of Medical Management of Chronic Issues   Diagnosis and orders addressed:  1. Hypertension associated with diabetes ([place]) Low sodium diet - CBC with Differential/Platelet - CMP14+EGFR  2. Hyperlipidemia with target LDL less than 100 Low fat diet - Lipid panel  3. Type 2 diabetes mellitus without complication, without long-term current use of insulin (HCC) Continue to watch carbs in diet - Bayer DCA Hb A1c Waived  4. Gastroesophageal reflux  disease without esophagitis Avoid spicy foods Do not eat 2 hours prior to bedtime   5. Vitamin D deficiency Continue daily vitamin d suppleemnt  6. BMI 33.0-33.9,adult Discussed diet and exercise for person with BMI >25 Will recheck weight in 3-6 months   7. Intermittent low back pain Moist heat  Aleve at night - Ambulatory referral to Chiropractic   Labs pending Health Maintenance reviewed  Diet and exercise encouraged  Follow up plan: 3 month   [place], FNP

## 2022-01-14 NOTE — Patient Instructions (Signed)

## 2022-01-15 LAB — CMP14+EGFR
ALT: 29 IU/L (ref 0–32)
AST: 26 IU/L (ref 0–40)
Albumin/Globulin Ratio: 1.7 (ref 1.2–2.2)
Albumin: 4.6 g/dL (ref 3.8–4.8)
Alkaline Phosphatase: 82 IU/L (ref 44–121)
BUN/Creatinine Ratio: 18 (ref 12–28)
BUN: 14 mg/dL (ref 8–27)
Bilirubin Total: <0.2 mg/dL (ref 0.0–1.2)
CO2: 26 mmol/L (ref 20–29)
Calcium: 10.1 mg/dL (ref 8.7–10.3)
Chloride: 103 mmol/L (ref 96–106)
Creatinine, Ser: 0.78 mg/dL (ref 0.57–1.00)
Globulin, Total: 2.7 g/dL (ref 1.5–4.5)
Glucose: 92 mg/dL (ref 70–99)
Potassium: 4.5 mmol/L (ref 3.5–5.2)
Sodium: 142 mmol/L (ref 134–144)
Total Protein: 7.3 g/dL (ref 6.0–8.5)
eGFR: 84 mL/min/{1.73_m2} (ref >59)

## 2022-01-15 LAB — LIPID PANEL
Chol/HDL Ratio: 4.3 ratio (ref 0.0–4.4)
Cholesterol, Total: 237 mg/dL — ABNORMAL HIGH (ref 100–199)
HDL: 55 mg/dL (ref >39)
LDL Chol Calc (NIH): 137 mg/dL — ABNORMAL HIGH (ref 0–99)
Triglycerides: 252 mg/dL — ABNORMAL HIGH (ref 0–149)
VLDL Cholesterol Cal: 45 mg/dL — ABNORMAL HIGH (ref 5–40)

## 2022-01-15 LAB — CBC WITH DIFFERENTIAL/PLATELET
Basophils Absolute: 0 10*3/uL (ref 0.0–0.2)
Basos: 0 %
EOS (ABSOLUTE): 0.2 10*3/uL (ref 0.0–0.4)
Eos: 3 %
Hematocrit: 42.4 % (ref 34.0–46.6)
Hemoglobin: 14 g/dL (ref 11.1–15.9)
Immature Grans (Abs): 0 10*3/uL (ref 0.0–0.1)
Immature Granulocytes: 0 %
Lymphocytes Absolute: 1.8 10*3/uL (ref 0.7–3.1)
Lymphs: 31 %
MCH: 30 pg (ref 26.6–33.0)
MCHC: 33 g/dL (ref 31.5–35.7)
MCV: 91 fL (ref 79–97)
Monocytes Absolute: 0.7 10*3/uL (ref 0.1–0.9)
Monocytes: 11 %
Neutrophils Absolute: 3.3 10*3/uL (ref 1.4–7.0)
Neutrophils: 55 %
Platelets: 235 10*3/uL (ref 150–450)
RBC: 4.66 x10E6/uL (ref 3.77–5.28)
RDW: 13.8 % (ref 11.7–15.4)
WBC: 5.9 10*3/uL (ref 3.4–10.8)

## 2022-01-20 DIAGNOSIS — M9901 Segmental and somatic dysfunction of cervical region: Secondary | ICD-10-CM | POA: Diagnosis not present

## 2022-01-20 DIAGNOSIS — M9903 Segmental and somatic dysfunction of lumbar region: Secondary | ICD-10-CM | POA: Diagnosis not present

## 2022-01-20 DIAGNOSIS — M9902 Segmental and somatic dysfunction of thoracic region: Secondary | ICD-10-CM | POA: Diagnosis not present

## 2022-01-20 DIAGNOSIS — M62830 Muscle spasm of back: Secondary | ICD-10-CM | POA: Diagnosis not present

## 2022-01-21 DIAGNOSIS — M9902 Segmental and somatic dysfunction of thoracic region: Secondary | ICD-10-CM | POA: Diagnosis not present

## 2022-01-21 DIAGNOSIS — M62830 Muscle spasm of back: Secondary | ICD-10-CM | POA: Diagnosis not present

## 2022-01-21 DIAGNOSIS — M9903 Segmental and somatic dysfunction of lumbar region: Secondary | ICD-10-CM | POA: Diagnosis not present

## 2022-01-21 DIAGNOSIS — M9901 Segmental and somatic dysfunction of cervical region: Secondary | ICD-10-CM | POA: Diagnosis not present

## 2022-01-25 ENCOUNTER — Other Ambulatory Visit: Payer: Self-pay

## 2022-01-25 DIAGNOSIS — M9902 Segmental and somatic dysfunction of thoracic region: Secondary | ICD-10-CM | POA: Diagnosis not present

## 2022-01-25 DIAGNOSIS — M9903 Segmental and somatic dysfunction of lumbar region: Secondary | ICD-10-CM | POA: Diagnosis not present

## 2022-01-25 DIAGNOSIS — M62830 Muscle spasm of back: Secondary | ICD-10-CM | POA: Diagnosis not present

## 2022-01-25 DIAGNOSIS — I152 Hypertension secondary to endocrine disorders: Secondary | ICD-10-CM

## 2022-01-25 DIAGNOSIS — E119 Type 2 diabetes mellitus without complications: Secondary | ICD-10-CM

## 2022-01-25 DIAGNOSIS — M9901 Segmental and somatic dysfunction of cervical region: Secondary | ICD-10-CM | POA: Diagnosis not present

## 2022-01-25 DIAGNOSIS — E785 Hyperlipidemia, unspecified: Secondary | ICD-10-CM

## 2022-01-25 MED ORDER — METFORMIN HCL 500 MG PO TABS: ORAL_TABLET | ORAL | 1 refills | Status: DC

## 2022-01-25 MED ORDER — LOSARTAN POTASSIUM 100 MG PO TABS: 50.0000 mg | ORAL_TABLET | Freq: Every day | ORAL | 1 refills | Status: DC

## 2022-01-25 MED ORDER — EZETIMIBE 10 MG PO TABS: 10.0000 mg | ORAL_TABLET | Freq: Every day | ORAL | 1 refills | Status: DC

## 2022-01-27 DIAGNOSIS — M9901 Segmental and somatic dysfunction of cervical region: Secondary | ICD-10-CM | POA: Diagnosis not present

## 2022-01-27 DIAGNOSIS — M62830 Muscle spasm of back: Secondary | ICD-10-CM | POA: Diagnosis not present

## 2022-01-27 DIAGNOSIS — M9902 Segmental and somatic dysfunction of thoracic region: Secondary | ICD-10-CM | POA: Diagnosis not present

## 2022-01-27 DIAGNOSIS — M9903 Segmental and somatic dysfunction of lumbar region: Secondary | ICD-10-CM | POA: Diagnosis not present

## 2022-01-28 DIAGNOSIS — M9902 Segmental and somatic dysfunction of thoracic region: Secondary | ICD-10-CM | POA: Diagnosis not present

## 2022-01-28 DIAGNOSIS — Z1231 Encounter for screening mammogram for malignant neoplasm of breast: Secondary | ICD-10-CM | POA: Diagnosis not present

## 2022-01-28 DIAGNOSIS — M9901 Segmental and somatic dysfunction of cervical region: Secondary | ICD-10-CM | POA: Diagnosis not present

## 2022-01-28 DIAGNOSIS — M62830 Muscle spasm of back: Secondary | ICD-10-CM | POA: Diagnosis not present

## 2022-01-28 DIAGNOSIS — M9903 Segmental and somatic dysfunction of lumbar region: Secondary | ICD-10-CM | POA: Diagnosis not present

## 2022-02-01 DIAGNOSIS — M9903 Segmental and somatic dysfunction of lumbar region: Secondary | ICD-10-CM | POA: Diagnosis not present

## 2022-02-01 DIAGNOSIS — M9901 Segmental and somatic dysfunction of cervical region: Secondary | ICD-10-CM | POA: Diagnosis not present

## 2022-02-01 DIAGNOSIS — M9902 Segmental and somatic dysfunction of thoracic region: Secondary | ICD-10-CM | POA: Diagnosis not present

## 2022-02-01 DIAGNOSIS — M62830 Muscle spasm of back: Secondary | ICD-10-CM | POA: Diagnosis not present

## 2022-02-03 DIAGNOSIS — M9902 Segmental and somatic dysfunction of thoracic region: Secondary | ICD-10-CM | POA: Diagnosis not present

## 2022-02-03 DIAGNOSIS — M62830 Muscle spasm of back: Secondary | ICD-10-CM | POA: Diagnosis not present

## 2022-02-03 DIAGNOSIS — M9903 Segmental and somatic dysfunction of lumbar region: Secondary | ICD-10-CM | POA: Diagnosis not present

## 2022-02-03 DIAGNOSIS — M9901 Segmental and somatic dysfunction of cervical region: Secondary | ICD-10-CM | POA: Diagnosis not present

## 2022-02-04 DIAGNOSIS — M9903 Segmental and somatic dysfunction of lumbar region: Secondary | ICD-10-CM | POA: Diagnosis not present

## 2022-02-04 DIAGNOSIS — M9901 Segmental and somatic dysfunction of cervical region: Secondary | ICD-10-CM | POA: Diagnosis not present

## 2022-02-04 DIAGNOSIS — M62830 Muscle spasm of back: Secondary | ICD-10-CM | POA: Diagnosis not present

## 2022-02-04 DIAGNOSIS — M9902 Segmental and somatic dysfunction of thoracic region: Secondary | ICD-10-CM | POA: Diagnosis not present

## 2022-02-08 DIAGNOSIS — M9903 Segmental and somatic dysfunction of lumbar region: Secondary | ICD-10-CM | POA: Diagnosis not present

## 2022-02-08 DIAGNOSIS — M9901 Segmental and somatic dysfunction of cervical region: Secondary | ICD-10-CM | POA: Diagnosis not present

## 2022-02-08 DIAGNOSIS — M9902 Segmental and somatic dysfunction of thoracic region: Secondary | ICD-10-CM | POA: Diagnosis not present

## 2022-02-08 DIAGNOSIS — M62830 Muscle spasm of back: Secondary | ICD-10-CM | POA: Diagnosis not present

## 2022-02-10 DIAGNOSIS — M9901 Segmental and somatic dysfunction of cervical region: Secondary | ICD-10-CM | POA: Diagnosis not present

## 2022-02-10 DIAGNOSIS — M62830 Muscle spasm of back: Secondary | ICD-10-CM | POA: Diagnosis not present

## 2022-02-10 DIAGNOSIS — M9903 Segmental and somatic dysfunction of lumbar region: Secondary | ICD-10-CM | POA: Diagnosis not present

## 2022-02-10 DIAGNOSIS — M9902 Segmental and somatic dysfunction of thoracic region: Secondary | ICD-10-CM | POA: Diagnosis not present

## 2022-02-11 DIAGNOSIS — M62830 Muscle spasm of back: Secondary | ICD-10-CM | POA: Diagnosis not present

## 2022-02-11 DIAGNOSIS — M9901 Segmental and somatic dysfunction of cervical region: Secondary | ICD-10-CM | POA: Diagnosis not present

## 2022-02-11 DIAGNOSIS — M9903 Segmental and somatic dysfunction of lumbar region: Secondary | ICD-10-CM | POA: Diagnosis not present

## 2022-02-11 DIAGNOSIS — M9902 Segmental and somatic dysfunction of thoracic region: Secondary | ICD-10-CM | POA: Diagnosis not present

## 2022-02-15 DIAGNOSIS — M9903 Segmental and somatic dysfunction of lumbar region: Secondary | ICD-10-CM | POA: Diagnosis not present

## 2022-02-15 DIAGNOSIS — M9902 Segmental and somatic dysfunction of thoracic region: Secondary | ICD-10-CM | POA: Diagnosis not present

## 2022-02-15 DIAGNOSIS — M62830 Muscle spasm of back: Secondary | ICD-10-CM | POA: Diagnosis not present

## 2022-02-15 DIAGNOSIS — M9901 Segmental and somatic dysfunction of cervical region: Secondary | ICD-10-CM | POA: Diagnosis not present

## 2022-02-18 DIAGNOSIS — M9902 Segmental and somatic dysfunction of thoracic region: Secondary | ICD-10-CM | POA: Diagnosis not present

## 2022-02-18 DIAGNOSIS — M9903 Segmental and somatic dysfunction of lumbar region: Secondary | ICD-10-CM | POA: Diagnosis not present

## 2022-02-18 DIAGNOSIS — M62830 Muscle spasm of back: Secondary | ICD-10-CM | POA: Diagnosis not present

## 2022-02-18 DIAGNOSIS — M9901 Segmental and somatic dysfunction of cervical region: Secondary | ICD-10-CM | POA: Diagnosis not present

## 2022-02-22 DIAGNOSIS — M9901 Segmental and somatic dysfunction of cervical region: Secondary | ICD-10-CM | POA: Diagnosis not present

## 2022-02-22 DIAGNOSIS — M9903 Segmental and somatic dysfunction of lumbar region: Secondary | ICD-10-CM | POA: Diagnosis not present

## 2022-02-22 DIAGNOSIS — M62830 Muscle spasm of back: Secondary | ICD-10-CM | POA: Diagnosis not present

## 2022-02-22 DIAGNOSIS — M9902 Segmental and somatic dysfunction of thoracic region: Secondary | ICD-10-CM | POA: Diagnosis not present

## 2022-02-25 DIAGNOSIS — M9903 Segmental and somatic dysfunction of lumbar region: Secondary | ICD-10-CM | POA: Diagnosis not present

## 2022-02-25 DIAGNOSIS — M9902 Segmental and somatic dysfunction of thoracic region: Secondary | ICD-10-CM | POA: Diagnosis not present

## 2022-02-25 DIAGNOSIS — M9901 Segmental and somatic dysfunction of cervical region: Secondary | ICD-10-CM | POA: Diagnosis not present

## 2022-02-25 DIAGNOSIS — M62830 Muscle spasm of back: Secondary | ICD-10-CM | POA: Diagnosis not present

## 2022-03-01 DIAGNOSIS — M9901 Segmental and somatic dysfunction of cervical region: Secondary | ICD-10-CM | POA: Diagnosis not present

## 2022-03-01 DIAGNOSIS — M9902 Segmental and somatic dysfunction of thoracic region: Secondary | ICD-10-CM | POA: Diagnosis not present

## 2022-03-01 DIAGNOSIS — M9903 Segmental and somatic dysfunction of lumbar region: Secondary | ICD-10-CM | POA: Diagnosis not present

## 2022-03-01 DIAGNOSIS — M62830 Muscle spasm of back: Secondary | ICD-10-CM | POA: Diagnosis not present

## 2022-03-04 DIAGNOSIS — M62830 Muscle spasm of back: Secondary | ICD-10-CM | POA: Diagnosis not present

## 2022-03-04 DIAGNOSIS — M9901 Segmental and somatic dysfunction of cervical region: Secondary | ICD-10-CM | POA: Diagnosis not present

## 2022-03-04 DIAGNOSIS — M9903 Segmental and somatic dysfunction of lumbar region: Secondary | ICD-10-CM | POA: Diagnosis not present

## 2022-03-04 DIAGNOSIS — M9902 Segmental and somatic dysfunction of thoracic region: Secondary | ICD-10-CM | POA: Diagnosis not present

## 2022-03-09 DIAGNOSIS — M9901 Segmental and somatic dysfunction of cervical region: Secondary | ICD-10-CM | POA: Diagnosis not present

## 2022-03-09 DIAGNOSIS — M62830 Muscle spasm of back: Secondary | ICD-10-CM | POA: Diagnosis not present

## 2022-03-09 DIAGNOSIS — M9902 Segmental and somatic dysfunction of thoracic region: Secondary | ICD-10-CM | POA: Diagnosis not present

## 2022-03-09 DIAGNOSIS — M9903 Segmental and somatic dysfunction of lumbar region: Secondary | ICD-10-CM | POA: Diagnosis not present

## 2022-03-12 ENCOUNTER — Other Ambulatory Visit: Payer: Self-pay | Admitting: Nurse Practitioner

## 2022-03-12 DIAGNOSIS — E119 Type 2 diabetes mellitus without complications: Secondary | ICD-10-CM

## 2022-03-16 DIAGNOSIS — M9902 Segmental and somatic dysfunction of thoracic region: Secondary | ICD-10-CM | POA: Diagnosis not present

## 2022-03-16 DIAGNOSIS — M9901 Segmental and somatic dysfunction of cervical region: Secondary | ICD-10-CM | POA: Diagnosis not present

## 2022-03-16 DIAGNOSIS — M9903 Segmental and somatic dysfunction of lumbar region: Secondary | ICD-10-CM | POA: Diagnosis not present

## 2022-03-16 DIAGNOSIS — M62830 Muscle spasm of back: Secondary | ICD-10-CM | POA: Diagnosis not present

## 2022-03-23 DIAGNOSIS — M9903 Segmental and somatic dysfunction of lumbar region: Secondary | ICD-10-CM | POA: Diagnosis not present

## 2022-03-23 DIAGNOSIS — M62830 Muscle spasm of back: Secondary | ICD-10-CM | POA: Diagnosis not present

## 2022-03-23 DIAGNOSIS — M9901 Segmental and somatic dysfunction of cervical region: Secondary | ICD-10-CM | POA: Diagnosis not present

## 2022-03-23 DIAGNOSIS — M9902 Segmental and somatic dysfunction of thoracic region: Secondary | ICD-10-CM | POA: Diagnosis not present

## 2022-03-30 DIAGNOSIS — M9902 Segmental and somatic dysfunction of thoracic region: Secondary | ICD-10-CM | POA: Diagnosis not present

## 2022-03-30 DIAGNOSIS — M62830 Muscle spasm of back: Secondary | ICD-10-CM | POA: Diagnosis not present

## 2022-03-30 DIAGNOSIS — M9901 Segmental and somatic dysfunction of cervical region: Secondary | ICD-10-CM | POA: Diagnosis not present

## 2022-03-30 DIAGNOSIS — M9903 Segmental and somatic dysfunction of lumbar region: Secondary | ICD-10-CM | POA: Diagnosis not present

## 2022-04-06 DIAGNOSIS — M62830 Muscle spasm of back: Secondary | ICD-10-CM | POA: Diagnosis not present

## 2022-04-06 DIAGNOSIS — M9902 Segmental and somatic dysfunction of thoracic region: Secondary | ICD-10-CM | POA: Diagnosis not present

## 2022-04-06 DIAGNOSIS — M9903 Segmental and somatic dysfunction of lumbar region: Secondary | ICD-10-CM | POA: Diagnosis not present

## 2022-04-06 DIAGNOSIS — M9901 Segmental and somatic dysfunction of cervical region: Secondary | ICD-10-CM | POA: Diagnosis not present

## 2022-04-13 ENCOUNTER — Other Ambulatory Visit: Payer: Self-pay

## 2022-04-13 DIAGNOSIS — M9901 Segmental and somatic dysfunction of cervical region: Secondary | ICD-10-CM | POA: Diagnosis not present

## 2022-04-13 DIAGNOSIS — M62830 Muscle spasm of back: Secondary | ICD-10-CM | POA: Diagnosis not present

## 2022-04-13 DIAGNOSIS — E1159 Type 2 diabetes mellitus with other circulatory complications: Secondary | ICD-10-CM

## 2022-04-13 DIAGNOSIS — M9903 Segmental and somatic dysfunction of lumbar region: Secondary | ICD-10-CM | POA: Diagnosis not present

## 2022-04-13 DIAGNOSIS — E119 Type 2 diabetes mellitus without complications: Secondary | ICD-10-CM

## 2022-04-13 DIAGNOSIS — E785 Hyperlipidemia, unspecified: Secondary | ICD-10-CM

## 2022-04-13 DIAGNOSIS — M9902 Segmental and somatic dysfunction of thoracic region: Secondary | ICD-10-CM | POA: Diagnosis not present

## 2022-04-14 ENCOUNTER — Encounter: Payer: Self-pay | Admitting: Nurse Practitioner

## 2022-04-14 ENCOUNTER — Ambulatory Visit (INDEPENDENT_AMBULATORY_CARE_PROVIDER_SITE_OTHER): Payer: Medicare HMO | Admitting: Nurse Practitioner

## 2022-04-14 VITALS — BP 133/71 | HR 67 | Temp 97.7°F | Resp 20 | Ht 62.0 in | Wt 181.0 lb

## 2022-04-14 DIAGNOSIS — I152 Hypertension secondary to endocrine disorders: Secondary | ICD-10-CM | POA: Diagnosis not present

## 2022-04-14 DIAGNOSIS — Z6833 Body mass index (BMI) 33.0-33.9, adult: Secondary | ICD-10-CM

## 2022-04-14 DIAGNOSIS — E119 Type 2 diabetes mellitus without complications: Secondary | ICD-10-CM | POA: Diagnosis not present

## 2022-04-14 DIAGNOSIS — E559 Vitamin D deficiency, unspecified: Secondary | ICD-10-CM | POA: Diagnosis not present

## 2022-04-14 DIAGNOSIS — K219 Gastro-esophageal reflux disease without esophagitis: Secondary | ICD-10-CM

## 2022-04-14 DIAGNOSIS — M8588 Other specified disorders of bone density and structure, other site: Secondary | ICD-10-CM

## 2022-04-14 DIAGNOSIS — E785 Hyperlipidemia, unspecified: Secondary | ICD-10-CM

## 2022-04-14 DIAGNOSIS — E1159 Type 2 diabetes mellitus with other circulatory complications: Secondary | ICD-10-CM

## 2022-04-14 LAB — BAYER DCA HB A1C WAIVED: HB A1C (BAYER DCA - WAIVED): 5.9 % — ABNORMAL HIGH (ref 4.8–5.6)

## 2022-04-14 MED ORDER — LOSARTAN POTASSIUM 100 MG PO TABS: 100.0000 mg | ORAL_TABLET | Freq: Every day | ORAL | 1 refills | Status: DC

## 2022-04-14 MED ORDER — METFORMIN HCL 500 MG PO TABS: 500.0000 mg | ORAL_TABLET | Freq: Every day | ORAL | 1 refills | Status: DC

## 2022-04-14 MED ORDER — EZETIMIBE 10 MG PO TABS: 10.0000 mg | ORAL_TABLET | Freq: Every day | ORAL | 1 refills | Status: DC

## 2022-04-14 NOTE — Progress Notes (Addendum)
? ?Subjective:  ? ? Patient ID: Katherine Ryan, female    DOB: [birth year], 66 y.o.   MRN: [id number] ? ? ?Chief Complaint: Medical Management of Chronic Issues ?  ? ?HPI: ? ?Katherine Ryan is a 66 y.o. who identifies as a female who was assigned female at birth.  ? ?Social history: ?Lives with: husband ?Work history: retired ? ? ?Comes in today for follow up of the following chronic medical issues: ? ?1. Hyperlipidemia with target LDL less than 100 ?Does try to watch diet. Does not do any dedicated exercise. ?Lab Results  ?Component Value Date  ? CHOL 237 (H) 01/14/2022  ? HDL 55 01/14/2022  ? LDLCALC 137 (H) 01/14/2022  ? TRIG 252 (H) 01/14/2022  ? CHOLHDL 4.3 01/14/2022  ? ? ? ?2. Hypertension associated with diabetes ([place]) ?No c/o chest pain, sob or headache. Does  check blood pressure at home. She says her blood pressure is up in the mornings 254-982 systolic. Will go down into 150 after taking her meds. Later in day it will go to 641 systolic.she has only been taking losartan 29m daily ?BP Readings from Last 3 Encounters:  ?01/14/22 (!) 143/70  ?10/16/21 136/82  ?07/02/21 (!) 149/78  ? ? ? ?3. Type 2 diabetes mellitus without complication, without long-term current use of insulin ([place] ?Fasting blood sugars are running 100-120. No low blood sugars ? ?4. Gastroesophageal reflux disease without esophagitis ?Takes OTC meds when needed. Doe snot need very often if she watches what she eats. ? ?5. Osteopenia of lumbar spine ?Last dexascan was done on 03/30/21. Her t score was -1.4 ? ?6. Vitamin D deficiency ?Is on daily vitamin d supplement ? ?7. BMI 33.0-33.9,adult ?weight is down 10 lbs ?Wt Readings from Last 3 Encounters:  ?04/14/22 181 lb (82.1 kg)  ?01/14/22 191 lb (86.6 kg)  ?10/16/21 179 lb (81.2 kg)  ? ?BMI Readings from Last 3 Encounters:  ?04/14/22 33.11 kg/m?  ?01/14/22 34.93 kg/m?  ?10/16/21 32.74 kg/m?  ? ? ? ? ?New complaints: ?None today ? ?Allergies  ?Allergen Reactions  ? Asa Arthritis  Strength-Antacid [Aspirin Buffered] Anaphylaxis  ?  GI upset  ? Ace Inhibitors Cough  ? Atorvastatin Other (See Comments)  ? Nsaids   ? ?Outpatient Encounter Medications as of 04/14/2022  ?Medication Sig  ? Biotin-D POWD by Does not apply route.  ? Calcium Carbonate (CALCIUM 500 PO) Take by mouth.  ? Cholecalciferol (VITAMIN D) 50 MCG (2000 UT) CAPS Take 1 capsule by mouth once daily  ? ibuprofen (ADVIL,MOTRIN) 200 MG tablet Take 200 mg by mouth every 6 (six) hours as needed.  ? losartan (COZAAR) 100 MG tablet Take 0.5 tablets (50 mg total) by mouth daily.  ? Magnesium 200 MG TABS Take by mouth.  ? metFORMIN (GLUCOPHAGE) 500 MG tablet Take 1 tablet by mouth once daily with breakfast  ? ezetimibe (ZETIA) 10 MG tablet Take 1 tablet (10 mg total) by mouth daily. (Patient not taking: Reported on 04/14/2022)  ? ?No facility-administered encounter medications on file as of 04/14/2022.  ? ? ?Past Surgical History:  ?Procedure Laterality Date  ? BELPHAROPTOSIS REPAIR Right 07/23/2019  ? CARPAL TUNNEL RELEASE Right   ? CATARACT PEDIATRIC    ? right  ? ? ?Family History  ?Problem Relation Age of Onset  ? Diabetes Mother   ? Hypertension Mother   ? Asthma Father   ? Arthritis Father   ? ? ? ? ?Controlled substance contract: n/a ? ? ? ? ?Review of  Systems  ?Constitutional:  Negative for diaphoresis.  ?Eyes:  Negative for pain.  ?Respiratory:  Negative for shortness of breath.   ?Cardiovascular:  Negative for chest pain, palpitations and leg swelling.  ?Gastrointestinal:  Negative for abdominal pain.  ?Endocrine: Negative for polydipsia.  ?Skin:  Negative for rash.  ?Neurological:  Negative for dizziness, weakness and headaches.  ?Hematological:  Does not bruise/bleed easily.  ?All other systems reviewed and are negative. ? ?   ?Objective:  ? Physical Exam ?Vitals and nursing note reviewed.  ?Constitutional:   ?   General: She is not in acute distress. ?   Appearance: Normal appearance. She is well-developed.  ?HENT:  ?   Head:  Normocephalic.  ?   Right Ear: Tympanic membrane normal.  ?   Left Ear: Tympanic membrane normal.  ?   Nose: Nose normal.  ?   Mouth/Throat:  ?   Mouth: Mucous membranes are moist.  ?Eyes:  ?   Pupils: Pupils are equal, round, and reactive to light.  ?Neck:  ?   Vascular: No carotid bruit or JVD.  ?Cardiovascular:  ?   Rate and Rhythm: Normal rate and regular rhythm.  ?   Heart sounds: Normal heart sounds.  ?Pulmonary:  ?   Effort: Pulmonary effort is normal. No respiratory distress.  ?   Breath sounds: Normal breath sounds. No wheezing or rales.  ?Chest:  ?   Chest wall: No tenderness.  ?Abdominal:  ?   General: Bowel sounds are normal. There is no distension or abdominal bruit.  ?   Palpations: Abdomen is soft. There is no hepatomegaly, splenomegaly, mass or pulsatile mass.  ?   Tenderness: There is no abdominal tenderness.  ?Musculoskeletal:     ?   General: Normal range of motion.  ?   Cervical back: Normal range of motion and neck supple.  ?Lymphadenopathy:  ?   Cervical: No cervical adenopathy.  ?Skin: ?   General: Skin is warm and dry.  ?Neurological:  ?   Mental Status: She is alert and oriented to person, place, and time.  ?   Deep Tendon Reflexes: Reflexes are normal and symmetric.  ?Psychiatric:     ?   Behavior: Behavior normal.     ?   Thought Content: Thought content normal.     ?   Judgment: Judgment normal.  ? ? ?BP 133/71   Pulse 67   Temp 97.7 ?F (36.5 ?C) (Temporal)   Resp 20   Ht '5\' 2"'  (1.575 m)   Wt 181 lb (82.1 kg)   LMP 04/04/2005   SpO2 93%   BMI 33.11 kg/m?  ? ?Hgba1c 5.9 ? ?   ?Assessment & Plan:  ?Katherine Ryan comes in today with chief complaint of Medical Management of Chronic Issues ? ? ?Diagnosis and orders addressed: ? ?1. Hyperlipidemia with target LDL less than 100 ?Low fat diet ?- Lipid panel ?- ezetimibe (ZETIA) 10 MG tablet; Take 1 tablet (10 mg total) by mouth daily.  Dispense: 90 tablet; Refill: 1 ? ?2. Hypertension associated with diabetes ([place]) ?Low sodium  diet ?Increase losartan to 166m daily ?- CMP14+EGFR ?- CBC with Differential/Platelet ?- losartan (COZAAR) 100 MG tablet; Take 1 tablet (100 mg total) by mouth daily.  Dispense: 90 tablet; Refill: 1 ? ?3. Type 2 diabetes mellitus without complication, without long-term [place] use of insulin ([place] ?Watch carbs ?- Bayer DCA Hb A1c Waived ?- metFORMIN (GLUCOPHAGE) 500 MG tablet; Take 1 tablet (500 mg total) by mouth  daily with breakfast.  Dispense: 90 tablet; Refill: 1 ? ?4. Gastroesophageal reflux disease without esophagitis ?Avoid spicy foods ?Do not eat 2 hours prior to bedtime ? ?5. Osteopenia of lumbar spine ?Weight bearing exercise whan can tolerate ? ?6. Vitamin D deficiency ?Continue aily vitamin d supplement ? ?7. BMI 33.0-33.9,adult ?Discussed diet and exercise for person with BMI >25 ?Will recheck weight in 3-6 months ? ? ? ?Labs pending ?Health Maintenance reviewed ?Diet and exercise encouraged ? ?Follow up plan: ?3 months ? ? ?[name], FNP ? ? ?

## 2022-04-14 NOTE — Patient Instructions (Signed)
R?gime alimentaire DASH ?DASH Eating Plan ?DASH est l?abr?viation de ? Dietary Approaches to Stop Hypertension ? (approches di?t?tiques pour arr?ter l?hypertension). Le r?gime alimentaire DASH est un plan d?alimentation sain qui s?est av?r? efficace pour : ?Faire baisser une tension art?rielle ?lev?e (hypertension). ?R?duire le risque de souffrir de diab?te de type 2, de maladie cardiaque et d?accident vasculaire c?r?bral. ?Education officer, community perte de poids. ?Comment bien suivre ce r?gime ? ?Lire les ?tiquettes [employer] aliments ?V?rifiez la quantit? de sel (sodium) contenue dans chaque portion [employer] ?tiquettes [employer]. Choisissez des aliments dont la valeur quotidienne en sodium est inf?rieure ? 5 %. En g?n?ral, les aliments qui contiennent moins de 300 milligrammes (mg) de sodium par portion peuvent ?tre int?gr?s ? ce r?gime alimentaire. ?Pour trouver les c?r?ales compl?tes, Production designer, theatre/television/film mot ? complet(?te) ? en premi?re position dans la liste des ingr?dients. ?Faire les courses ?Achetez des [employer] ?tiquet?s ? faible teneur en sodium ? ou ? sans sel ajout? ?. ?Achetez des aliments frais. ?vitez les aliments en conserve et les repas pr?par?s ou surgel?s. ?Cuisiner ??vitez d?ajouter du sel lorsque vous cuisinez. Utilisez des assaisonnements ou des herbes sans sel au lieu du sel de table ou du sel de mer. Consultez votre prestataire de soins de sant? ou votre pharmacien avant d?utiliser des substituts de sel. ?Ne faites pas frire les aliments. Utilisez plut?t des m?thodes de cuisson saines, telles que la cuisson au four, le pochage, la grillade au four ou sur le gril et le r?tissage. ?Cuisinez [employer] Disney c?ur, telles que l?huile d?olive, de colza, d?avocat, de soja ou de tournesol. ?Planifier les repas ? ?Adoptez une alimentation ?quilibr?e qui inclut : ?Au moins 4 portions de fruits et 4 portions de l?gumes par jour. Essayez  de remplir la moiti? de votre assiette de fruits et de l?gumes. ?6 ? 8 portions de c?r?ales compl?tes par jour. ?Moins de 6 onces (170 g) de viande maigre, de [place] ou de poisson par jour. Une portion de viande de 3 onces (85 g) correspond environ ? la taille d?un paquet de cartes. Un ?uf ?quivaut ? 1 once (28 g). ?2 ? 3 portions de produits laitiers pauvres en mati?res grasses par jour. Une portion correspond ? 1 tasse (237 ml). ?1 portion de fruits ? coque, graines ou haricots secs 5 fois par semaine. ?2 ? 3 portions de graisses bonnes pour le c?ur. Les graisses bonnes pour la sant?, appel?es [employer] om?ga-3, se trouvent dans des aliments tels que les noix, les graines de lin, les laits enrichis et les ?ufs. Ces graisses sont ?galement pr?sentes dans les poissons d?eaux froides, comme les sardines, le saumon et [employer]. ?Limitez votre consommation : ?D?aliments en conserve ou pr?emball?s. ?D?aliments riches en graisse trans, comme certains aliments frits. ?D?aliments riches en graisses satur?es, tels que la viande grasse. ?De desserts et d?autres sucreries, de boissons sucr?es et d?autres aliments contenant du sucre ajout?Marland Kitchen ?De produits laitiers entiers. ?N?ajoutez pas de sel ? vos aliments avant de manger. ?Ne mangez pas plus de 4 jaunes d??uf par semaine. ?Essayez de consommer au moins 2 repas v?g?tariens par semaine. ?Privil?giez les aliments pr?par?s ? la [employer], de buffet et de restauration rapide. ?Mode de vie ?Lorsque vous [employer], demandez que votre nourriture soit pr?par?e avec moins de sel, voire sans sel, si possible. ?Si vous consommez de l?alcool : ?Nurse, mental health ? : ?1 verre par  jour pour les femmes qui ne sont pas enceintes. ?2 verres par [employer]. ?Stann Ore conscience de la quantit? d?alcool contenue dans votre verre. Aux ?.-U., un verre correspond ? une bouteille de bi?re (355 ml [12 onces]), ? un verre de vin (148 ml [5  onces]) ou ? un verre ? liqueur d?alcool fort (44 ml [1,5 once]). ?Informations g?n?rales ??vitez de consommer plus de 2 300 mg de sel par jour. Si vous souffrez d?hypertension, il se peut que vous deviez limiter votre apport de sodium ? 1 500 mg par jour. ?Demandez l?aide de votre prestataire de soins de sant? pour [employer] un poids sant? ou pour [employer]. Demandez-lui quel est le poids id?al pour vous. ?Faites au moins 30 minutes d?exercice physique qui font battre votre c?ur rapidement (exercice a?robique) chaque jour ou presque. Vous pouvez, par exemple, Fish farm manager, nager ou faire du v?lo. ?Faites appel ? votre prestataire de soins de sant? ou ? votre di?t?ticien pour adapter votre r?gime alimentaire ? vos besoins individuels en calories. ?Quelle nourriture dois-je manger ? ?Fruits ?Tous les fruits frais, s?ch?s ou congel?s. Fruits en conserve dans leur jus naturel (sans sucre ajout?). ?L?gumes ?L?gumes frais ou congel?s (crus, ? la vapeur, grill?s ou saut?s ? la po?le). Jus de tomates et de l?gumes ? faible teneur en sodium ou ? teneur en sodium r?duite. Sauce tomate ou p?te de tomates ? faible teneur en sodium ou ? teneur en sodium r?duite. L?gumes en conserve ? faible teneur en sodium ou ? teneur en sodium r?duite. ?C?r?ales ?Pain au bl? entier ou aux c?r?ales compl?tes. P?tes au bl? entier ou aux c?r?ales compl?tes. Riz brun. Farine d?avoine. Quinoa. Boulgour. C?r?ales compl?tes et pauvres en sodium. Pain pita. Craquelins pauvres en graisses et en sodium. Tortillas de farine de bl? entier. ?Viandes et autres prot?ines ?Poulet ou dinde sans la peau. Poulet ou dinde hach?s. Porc d?graiss?Lynnea Maizes et fruits de mer. Blancs d??ufs. L?gumineuses, lentilles ou pois secs. Noix non sal?es, beurres de noix et graines. Haricots secs en conserve non sal?s. Tranches maigres de b?uf d?graiss?Ralph Leyden pr?cuites et charcuterie maigres ? faible teneur en sodium, comme les saucisses ou les pains de viande. ?Produits  laitiers ?Lait ? faible teneur en mati?re grasse (1 %) ou ?cr?m?Marland Kitchen Fromages ? teneur en mati?res grasses r?duite, ? faible teneur en mati?res grasses ou sans mati?res grasses. Ricotta ou cottage cheese sans mati?res grasses et pauvres en sodium. [place] sans mati?res grasses ou pauvre en mati?re grasse. Fromage pauvre en mati?res grasses et en sodium. ?Graisses et huiles ?Margarine molle sans graisses trans. Huile v?g?tale. Mayonnaises et vinaigrettes ? teneur en mati?res grasses r?duite, ? faible teneur en mati?res grasses ou all?g?es (? teneur en sodium r?duite). Huiles de colza, de carthame, d?olive, d?avocat, de soja et de tournesol. Avocats. ?Assaisonnements et condiments ?Aromates. ?pices. M?langes d?assaisonnement sans sel. ?Autres aliments ?Popcorn et bretzels non sal?s. Bonbons sans mati?re grasse. ?Cette liste d?aliments et de boissons recommand?s n?est pas n?cessairement exhaustive. Contactez un di?t?ticien pour plus d?informations. ?Quelle nourriture dois-je ?viter ? ?Fruits ?Fruits en conserve dans un sirop l?ger ou ?pais. Fruits frits. Fruits dans une sauce ? la cr?me ou au beurre. ?L?gumes ?L?gumes frits ou ? la cr?me. L?gumes dans une sauce au fromage. L?gumes en conserve ordinaires (qui ne sont pas ? faible teneur en sodium ou ? teneur en sodium r?duite). Sauce tomate ou p?te de tomates ordinaire (qui n?est pas ? faible teneur en sodium ou ? teneur en sodium r?duite). Jus de tomates et de l?gumes ordinaire (  qui n?est pas ? faible teneur en sodium ou ? teneur en sodium r?duite). Cornichons. Olives. ?C?r?ales ?Produits de boulangerie contenant des mati?res grasses, comme les croissants, les muffins ou certains pains. Paquets de p?tes ou de riz lyophilis?s. ?Viandes et autres prot?ines ?Pi?ces de viande grasses. C?tes. Viande frite. Berniece Salines. Saucisson de Bolog[place], salami et autres viandes pr?cuites ou charcuterie, comme les saucisses ou les pains de viande. Lard provenant du [employer] (bardi?re).  Br[place]urst. Noix et graines sal?es. Haricots secs en conserve contenant du sel ajout?Lynnea Maizes en conserve ou fum?. ?ufs entiers ou jaunes d??ufs. Poulet ou dinde avec la peau. ?Produits laitiers ?Lait entier o

## 2022-04-15 LAB — CBC WITH DIFFERENTIAL/PLATELET
Basophils Absolute: 0 10*3/uL (ref 0.0–0.2)
Basos: 0 %
EOS (ABSOLUTE): 0.1 10*3/uL (ref 0.0–0.4)
Eos: 2 %
Hematocrit: 42.3 % (ref 34.0–46.6)
Hemoglobin: 14.1 g/dL (ref 11.1–15.9)
Immature Grans (Abs): 0 10*3/uL (ref 0.0–0.1)
Immature Granulocytes: 0 %
Lymphocytes Absolute: 1.5 10*3/uL (ref 0.7–3.1)
Lymphs: 30 %
MCH: 29.7 pg (ref 26.6–33.0)
MCHC: 33.3 g/dL (ref 31.5–35.7)
MCV: 89 fL (ref 79–97)
Monocytes Absolute: 0.5 10*3/uL (ref 0.1–0.9)
Monocytes: 10 %
Neutrophils Absolute: 2.9 10*3/uL (ref 1.4–7.0)
Neutrophils: 58 %
Platelets: 219 10*3/uL (ref 150–450)
RBC: 4.75 x10E6/uL (ref 3.77–5.28)
RDW: 13.1 % (ref 11.7–15.4)
WBC: 5.1 10*3/uL (ref 3.4–10.8)

## 2022-04-15 LAB — CMP14+EGFR
ALT: 18 IU/L (ref 0–32)
AST: 14 IU/L (ref 0–40)
Albumin/Globulin Ratio: 1.7 (ref 1.2–2.2)
Albumin: 4.5 g/dL (ref 3.8–4.8)
Alkaline Phosphatase: 72 IU/L (ref 44–121)
BUN/Creatinine Ratio: 23 (ref 12–28)
BUN: 18 mg/dL (ref 8–27)
Bilirubin Total: 0.3 mg/dL (ref 0.0–1.2)
CO2: 25 mmol/L (ref 20–29)
Calcium: 9.3 mg/dL (ref 8.7–10.3)
Chloride: 103 mmol/L (ref 96–106)
Creatinine, Ser: 0.79 mg/dL (ref 0.57–1.00)
Globulin, Total: 2.7 g/dL (ref 1.5–4.5)
Glucose: 115 mg/dL — ABNORMAL HIGH (ref 70–99)
Potassium: 4.6 mmol/L (ref 3.5–5.2)
Sodium: 141 mmol/L (ref 134–144)
Total Protein: 7.2 g/dL (ref 6.0–8.5)
eGFR: 83 mL/min/{1.73_m2} (ref >59)

## 2022-04-15 LAB — LIPID PANEL
Chol/HDL Ratio: 4.4 ratio (ref 0.0–4.4)
Cholesterol, Total: 276 mg/dL — ABNORMAL HIGH (ref 100–199)
HDL: 63 mg/dL (ref >39)
LDL Chol Calc (NIH): 193 mg/dL — ABNORMAL HIGH (ref 0–99)
Triglycerides: 112 mg/dL (ref 0–149)
VLDL Cholesterol Cal: 20 mg/dL (ref 5–40)

## 2022-04-20 DIAGNOSIS — M9901 Segmental and somatic dysfunction of cervical region: Secondary | ICD-10-CM | POA: Diagnosis not present

## 2022-04-20 DIAGNOSIS — M62830 Muscle spasm of back: Secondary | ICD-10-CM | POA: Diagnosis not present

## 2022-04-20 DIAGNOSIS — M9902 Segmental and somatic dysfunction of thoracic region: Secondary | ICD-10-CM | POA: Diagnosis not present

## 2022-04-20 DIAGNOSIS — M9903 Segmental and somatic dysfunction of lumbar region: Secondary | ICD-10-CM | POA: Diagnosis not present

## 2022-04-27 DIAGNOSIS — M9902 Segmental and somatic dysfunction of thoracic region: Secondary | ICD-10-CM | POA: Diagnosis not present

## 2022-04-27 DIAGNOSIS — M9901 Segmental and somatic dysfunction of cervical region: Secondary | ICD-10-CM | POA: Diagnosis not present

## 2022-04-27 DIAGNOSIS — M62830 Muscle spasm of back: Secondary | ICD-10-CM | POA: Diagnosis not present

## 2022-04-27 DIAGNOSIS — M9903 Segmental and somatic dysfunction of lumbar region: Secondary | ICD-10-CM | POA: Diagnosis not present

## 2022-05-10 ENCOUNTER — Telehealth: Payer: Self-pay | Admitting: Nurse Practitioner

## 2022-05-10 ENCOUNTER — Other Ambulatory Visit: Payer: Self-pay | Admitting: Nurse Practitioner

## 2022-05-10 MED ORDER — ROSUVASTATIN CALCIUM 10 MG PO TABS: 10.0000 mg | ORAL_TABLET | Freq: Every day | ORAL | 1 refills | Status: DC

## 2022-05-10 NOTE — Telephone Encounter (Signed)
Patient called stating that she is worried about her cholesterol being high and wants to slowly try another cholesterol med. She states that she would like to try Crestor '10mg'$  weekly and work herself up to twice week and then so on. Rx sent to [facility] per patients request ?

## 2022-06-04 ENCOUNTER — Encounter: Payer: Self-pay | Admitting: Nurse Practitioner

## 2022-06-04 ENCOUNTER — Ambulatory Visit (INDEPENDENT_AMBULATORY_CARE_PROVIDER_SITE_OTHER): Payer: Medicare HMO | Admitting: Nurse Practitioner

## 2022-06-04 VITALS — BP 156/86 | HR 66 | Temp 97.8°F | Resp 20 | Ht 62.0 in | Wt 183.0 lb

## 2022-06-04 DIAGNOSIS — R42 Dizziness and giddiness: Secondary | ICD-10-CM | POA: Diagnosis not present

## 2022-06-04 DIAGNOSIS — R6889 Other general symptoms and signs: Secondary | ICD-10-CM | POA: Diagnosis not present

## 2022-06-04 NOTE — Progress Notes (Signed)
   Subjective:    Patient ID: Katherine Ryan, female    DOB: [birth year], 65 y.o.   MRN: [id number]   Chief Complaint: Dizziness and Unable to focus eyes   Dizziness Pertinent negatives include no abdominal pain, chest pain, diaphoresis, headaches, rash or weakness.   Patient come sin stating that she feels light headed but not dizzy. Describes feeling , ike having a buzz form drinking a little alcohol.Having troubling focusing and this morning her vision is blurry. Her blood pressure at home has been normal. Has been constant for 2-3 days and is worse in the mornings. After she eats lunch she feels some better. Fasting blood sugars have been around 130-140. Denies tachycardia or palpitations.    Review of Systems  Constitutional:  Negative for diaphoresis.  Eyes:  Negative for pain.  Respiratory:  Negative for shortness of breath.   Cardiovascular:  Negative for chest pain, palpitations and leg swelling.  Gastrointestinal:  Negative for abdominal pain.  Endocrine: Negative for polydipsia.  Skin:  Negative for rash.  Neurological:  Positive for dizziness. Negative for weakness and headaches.  Hematological:  Does not bruise/bleed easily.  All other systems reviewed and are negative.      Objective:   Physical Exam Vitals reviewed.  Constitutional:      Appearance: Normal appearance.  Cardiovascular:     Rate and Rhythm: Normal rate and regular rhythm.     Heart sounds: Normal heart sounds.  Pulmonary:     Effort: Pulmonary effort is normal.     Breath sounds: Normal breath sounds.  Skin:    General: Skin is warm.  Neurological:     General: No focal deficit present.     Mental Status: She is alert and oriented to person, place, and time.  Psychiatric:        Mood and Affect: Mood normal.        Behavior: Behavior normal.    BP (!) 159/71   Pulse 66   Temp 97.8 F (36.6 C) (Temporal)   Resp 20   Ht $R'5\' 2"'sH$  (1.575 m)   Wt 183 lb (83 kg)   LMP 04/04/2005   SpO2 96%    BMI 33.47 kg/m         Assessment & Plan:  Katherine Ryan in today with chief complaint of Dizziness and Unable to focus eyes   1. Light-headed feeling Labs pending Force fluids Keep diary of blood pressure and blood sugar at home - BMP8+EGFR - CBC with Differential/Platelet - Thyroid Panel With TSH    The above assessment and management plan was discussed with the patient. The patient verbalized understanding of and has agreed to the management plan. Patient is aware to call the clinic if symptoms persist or worsen. Patient is aware when to return to the clinic for a follow-up visit. Patient educated on when it is appropriate to go to the emergency department.   [name], FNP

## 2022-06-04 NOTE — Patient Instructions (Signed)
Dizziness Dizziness is a common problem. It is a feeling of unsteadiness or light-headedness. You may feel like you are about to faint. Dizziness can lead to injury if you stumble or fall. Anyone can become dizzy, but dizziness is more common in older adults. This condition can be caused by a number of things, including medicines, dehydration, or illness. Follow these instructions at home: Eating and drinking  Drink enough fluid to keep your urine pale yellow. This helps to keep you from becoming dehydrated. Try to drink more clear fluids, such as water. Do not drink alcohol. Limit your caffeine intake if told to do so by your health care provider. Check ingredients and nutrition facts to see if a food or beverage contains caffeine. Limit your salt (sodium) intake if told to do so by your health care provider. Check ingredients and nutrition facts to see if a food or beverage contains sodium. Activity  Avoid making quick movements. Rise slowly from chairs and steady yourself until you feel okay. In the morning, first sit up on the side of the bed. When you feel okay, stand slowly while you hold onto something until you know that your balance is good. If you need to stand in one place for a long time, move your legs often. Tighten and relax the muscles in your legs while you are standing. Do not drive or use machinery if you feel dizzy. Avoid bending down if you feel dizzy. Place items in your home so that they are easy for you to reach without leaning over. Lifestyle Do not use any products that contain nicotine or tobacco. These products include cigarettes, chewing tobacco, and vaping devices, such as e-cigarettes. If you need help quitting, ask your health care provider. Try to reduce your stress level by using methods such as yoga or meditation. Talk with your health care provider if you need help to manage your stress. General instructions Watch your dizziness for any changes. Take  over-the-counter and prescription medicines only as told by your health care provider. Talk with your health care provider if you think that your dizziness is caused by a medicine that you are taking. Tell a friend or a family member that you are feeling dizzy. If he or she notices any changes in your behavior, have this person call your health care provider. Keep all follow-up visits. This is important. Contact a health care provider if: Your dizziness does not go away or you have new symptoms. Your dizziness or light-headedness gets worse. You feel nauseous. You have reduced hearing. You have a fever. You have neck pain or a stiff neck. Your dizziness leads to an injury or a fall. Get help right away if: You vomit or have diarrhea and are unable to eat or drink anything. You have problems talking, walking, swallowing, or using your arms, hands, or legs. You feel generally weak. You have any bleeding. You are not thinking clearly or you have trouble forming sentences. It may take a friend or family member to notice this. You have chest pain, abdominal pain, shortness of breath, or sweating. Your vision changes or you develop a severe headache. These symptoms may represent a serious problem that is an emergency. Do not wait to see if the symptoms will go away. Get medical help right away. Call your local emergency services (911 in the [place] Do not drive yourself to the hospital. Summary Dizziness is a feeling of unsteadiness or light-headedness. This condition can be caused by a number of   things, including medicines, dehydration, or illness. Anyone can become dizzy, but dizziness is more common in older adults. Drink enough fluid to keep your urine pale yellow. Do not drink alcohol. Avoid making quick movements if you feel dizzy. Monitor your dizziness for any changes. This information is not intended to replace advice given to you by your health care provider. Make sure you discuss any  questions you have with your health care provider. Document Revised: 11/17/2020 Document Reviewed: 11/17/2020 Elsevier Patient Education  2023 Elsevier Inc.  

## 2022-06-05 LAB — CBC WITH DIFFERENTIAL/PLATELET
Basophils Absolute: 0 10*3/uL (ref 0.0–0.2)
Basos: 1 %
EOS (ABSOLUTE): 0.1 10*3/uL (ref 0.0–0.4)
Eos: 2 %
Hematocrit: 41.2 % (ref 34.0–46.6)
Hemoglobin: 13.9 g/dL (ref 11.1–15.9)
Immature Grans (Abs): 0 10*3/uL (ref 0.0–0.1)
Immature Granulocytes: 0 %
Lymphocytes Absolute: 1.5 10*3/uL (ref 0.7–3.1)
Lymphs: 27 %
MCH: 29.8 pg (ref 26.6–33.0)
MCHC: 33.7 g/dL (ref 31.5–35.7)
MCV: 88 fL (ref 79–97)
Monocytes Absolute: 0.6 10*3/uL (ref 0.1–0.9)
Monocytes: 10 %
Neutrophils Absolute: 3.3 10*3/uL (ref 1.4–7.0)
Neutrophils: 60 %
Platelets: 219 10*3/uL (ref 150–450)
RBC: 4.66 x10E6/uL (ref 3.77–5.28)
RDW: 13.1 % (ref 11.7–15.4)
WBC: 5.6 10*3/uL (ref 3.4–10.8)

## 2022-06-05 LAB — THYROID PANEL WITH TSH
Free Thyroxine Index: 1.9 (ref 1.2–4.9)
T3 Uptake Ratio: 21 % — ABNORMAL LOW (ref 24–39)
T4, Total: 8.9 ug/dL (ref 4.5–12.0)
TSH: 2.21 u[IU]/mL (ref 0.450–4.500)

## 2022-06-05 LAB — BMP8+EGFR
BUN/Creatinine Ratio: 27 (ref 12–28)
BUN: 18 mg/dL (ref 8–27)
CO2: 25 mmol/L (ref 20–29)
Calcium: 9.5 mg/dL (ref 8.7–10.3)
Chloride: 101 mmol/L (ref 96–106)
Creatinine, Ser: 0.66 mg/dL (ref 0.57–1.00)
Glucose: 95 mg/dL (ref 70–99)
Potassium: 4.9 mmol/L (ref 3.5–5.2)
Sodium: 140 mmol/L (ref 134–144)
eGFR: 97 mL/min/{1.73_m2} (ref >59)

## 2022-06-08 ENCOUNTER — Telehealth: Payer: Self-pay | Admitting: Nurse Practitioner

## 2022-06-08 DIAGNOSIS — R42 Dizziness and giddiness: Secondary | ICD-10-CM

## 2022-06-08 NOTE — Telephone Encounter (Signed)
Any suggestions>?

## 2022-06-08 NOTE — Telephone Encounter (Signed)
Patient calling to let Mary-Margaret know that she has noticed when she sits for a long period of time she gets light headed. She was seen on 6/9 for this. Please call back to advise.

## 2022-06-09 NOTE — Telephone Encounter (Signed)
Need cardiology referral- find out where she would like to go and do referral please

## 2022-06-09 NOTE — Telephone Encounter (Signed)
Patient notified and agreed to see cardiology. Referral placed

## 2022-07-15 ENCOUNTER — Ambulatory Visit: Payer: Medicare HMO | Admitting: Nurse Practitioner

## 2022-07-15 DIAGNOSIS — S0571XD Avulsion of right eye, subsequent encounter: Secondary | ICD-10-CM | POA: Diagnosis not present

## 2022-07-16 ENCOUNTER — Other Ambulatory Visit: Payer: Medicare HMO

## 2022-07-16 ENCOUNTER — Other Ambulatory Visit: Payer: Self-pay

## 2022-07-16 DIAGNOSIS — E119 Type 2 diabetes mellitus without complications: Secondary | ICD-10-CM

## 2022-07-16 DIAGNOSIS — R42 Dizziness and giddiness: Secondary | ICD-10-CM

## 2022-07-16 DIAGNOSIS — E785 Hyperlipidemia, unspecified: Secondary | ICD-10-CM

## 2022-07-16 DIAGNOSIS — E1159 Type 2 diabetes mellitus with other circulatory complications: Secondary | ICD-10-CM

## 2022-07-16 DIAGNOSIS — I152 Hypertension secondary to endocrine disorders: Secondary | ICD-10-CM | POA: Diagnosis not present

## 2022-07-16 LAB — BAYER DCA HB A1C WAIVED: HB A1C (BAYER DCA - WAIVED): 6.3 % — ABNORMAL HIGH (ref 4.8–5.6)

## 2022-07-17 LAB — LIPID PANEL
Chol/HDL Ratio: 3.1 ratio (ref 0.0–4.4)
Cholesterol, Total: 254 mg/dL — ABNORMAL HIGH (ref 100–199)
HDL: 81 mg/dL (ref >39)
LDL Chol Calc (NIH): 155 mg/dL — ABNORMAL HIGH (ref 0–99)
Triglycerides: 103 mg/dL (ref 0–149)
VLDL Cholesterol Cal: 18 mg/dL (ref 5–40)

## 2022-07-17 LAB — CMP14+EGFR
ALT: 28 IU/L (ref 0–32)
AST: 22 IU/L (ref 0–40)
Albumin/Globulin Ratio: 1.4 (ref 1.2–2.2)
Albumin: 4.4 g/dL (ref 3.9–4.9)
Alkaline Phosphatase: 79 IU/L (ref 44–121)
BUN/Creatinine Ratio: 19 (ref 12–28)
BUN: 14 mg/dL (ref 8–27)
Bilirubin Total: 0.3 mg/dL (ref 0.0–1.2)
CO2: 24 mmol/L (ref 20–29)
Calcium: 9.7 mg/dL (ref 8.7–10.3)
Chloride: 100 mmol/L (ref 96–106)
Creatinine, Ser: 0.75 mg/dL (ref 0.57–1.00)
Globulin, Total: 3.1 g/dL (ref 1.5–4.5)
Glucose: 119 mg/dL — ABNORMAL HIGH (ref 70–99)
Potassium: 5.1 mmol/L (ref 3.5–5.2)
Sodium: 140 mmol/L (ref 134–144)
Total Protein: 7.5 g/dL (ref 6.0–8.5)
eGFR: 88 mL/min/{1.73_m2} (ref >59)

## 2022-07-17 LAB — CBC WITH DIFFERENTIAL/PLATELET
Basophils Absolute: 0 10*3/uL (ref 0.0–0.2)
Basos: 1 %
EOS (ABSOLUTE): 0.1 10*3/uL (ref 0.0–0.4)
Eos: 3 %
Hematocrit: 42.9 % (ref 34.0–46.6)
Hemoglobin: 13.9 g/dL (ref 11.1–15.9)
Immature Grans (Abs): 0 10*3/uL (ref 0.0–0.1)
Immature Granulocytes: 0 %
Lymphocytes Absolute: 1.5 10*3/uL (ref 0.7–3.1)
Lymphs: 28 %
MCH: 30 pg (ref 26.6–33.0)
MCHC: 32.4 g/dL (ref 31.5–35.7)
MCV: 93 fL (ref 79–97)
Monocytes Absolute: 0.7 10*3/uL (ref 0.1–0.9)
Monocytes: 12 %
Neutrophils Absolute: 3.1 10*3/uL (ref 1.4–7.0)
Neutrophils: 56 %
Platelets: 243 10*3/uL (ref 150–450)
RBC: 4.64 x10E6/uL (ref 3.77–5.28)
RDW: 13.7 % (ref 11.7–15.4)
WBC: 5.4 10*3/uL (ref 3.4–10.8)

## 2022-07-17 LAB — MICROALBUMIN / CREATININE URINE RATIO
Creatinine, Urine: 11.6 mg/dL
Microalb/Creat Ratio: <26 mg/g creat (ref 0–29)
Microalbumin, Urine: <3 ug/mL

## 2022-07-17 LAB — THYROID PANEL WITH TSH
Free Thyroxine Index: 1.7 (ref 1.2–4.9)
T3 Uptake Ratio: 21 % — ABNORMAL LOW (ref 24–39)
T4, Total: 8.3 ug/dL (ref 4.5–12.0)
TSH: 2.09 u[IU]/mL (ref 0.450–4.500)

## 2022-07-19 ENCOUNTER — Ambulatory Visit (INDEPENDENT_AMBULATORY_CARE_PROVIDER_SITE_OTHER): Payer: Medicare HMO | Admitting: Nurse Practitioner

## 2022-07-19 ENCOUNTER — Ambulatory Visit: Payer: Medicare HMO | Admitting: Nurse Practitioner

## 2022-07-19 ENCOUNTER — Encounter: Payer: Self-pay | Admitting: Nurse Practitioner

## 2022-07-19 VITALS — BP 122/71 | HR 77 | Temp 97.9°F | Resp 20 | Ht 62.0 in | Wt 185.0 lb

## 2022-07-19 DIAGNOSIS — E1159 Type 2 diabetes mellitus with other circulatory complications: Secondary | ICD-10-CM

## 2022-07-19 DIAGNOSIS — I152 Hypertension secondary to endocrine disorders: Secondary | ICD-10-CM

## 2022-07-19 DIAGNOSIS — Z6833 Body mass index (BMI) 33.0-33.9, adult: Secondary | ICD-10-CM | POA: Diagnosis not present

## 2022-07-19 DIAGNOSIS — M8588 Other specified disorders of bone density and structure, other site: Secondary | ICD-10-CM

## 2022-07-19 DIAGNOSIS — E559 Vitamin D deficiency, unspecified: Secondary | ICD-10-CM

## 2022-07-19 DIAGNOSIS — E785 Hyperlipidemia, unspecified: Secondary | ICD-10-CM | POA: Diagnosis not present

## 2022-07-19 DIAGNOSIS — K219 Gastro-esophageal reflux disease without esophagitis: Secondary | ICD-10-CM

## 2022-07-19 DIAGNOSIS — E119 Type 2 diabetes mellitus without complications: Secondary | ICD-10-CM

## 2022-07-19 NOTE — Progress Notes (Signed)
Subjective:    Patient ID: Katherine Ryan, female    DOB: 22-Sep-1956, 66 y.o.   MRN: [id number]   Chief Complaint: Medical Management of Chronic Issues    HPI:  Katherine Ryan is a 66 y.o. who identifies as a female who was assigned female at birth.   Social history: Lives with: husband Work history: retired   Scientist, forensic in today for follow up of the following chronic medical issues:  1. Hyperlipidemia with target LDL less than 100 Does try to watch diet. She likes to try different diets. Currently she is not eating any meat. Lab Results  Component Value Date   CHOL 254 (H) 07/16/2022   HDL 81 07/16/2022   LDLCALC 155 (H) 07/16/2022   TRIG 103 07/16/2022   CHOLHDL 3.1 07/16/2022  The 10-year ASCVD risk score (Arnett DK, et al., 2019) is: 12.3%    2. Type 2 diabetes mellitus without complication, without long-term current use of insulin (HCC) Fasting blood sugars are running around 110-130. No low blood sugars. Lab Results  Component Value Date   HGBA1C 6.3 (H) 07/16/2022     3. Hypertension associated with diabetes ([place]) No c/o chest pain, sob or headache. Does not check blood pressure at home. BP Readings from Last 3 Encounters:  07/19/22 122/71  06/04/22 (!) 156/86  04/14/22 133/71     4. Gastroesophageal reflux disease without esophagitis Is on no meds. Very seldom has symptoms  5. Osteopenia of lumbar spine Last dexascan was done on 03/30/21. Her t score was -1.4. she doe sno weight bearing exercises.  6. Vitamin D deficiency Iso n daily vitamin d supplement  7. BMI 33.0-33.9,adult Wt Readings from Last 3 Encounters:  07/19/22 185 lb (83.9 kg)  06/04/22 183 lb (83 kg)  04/14/22 181 lb (82.1 kg)   BMI Readings from Last 3 Encounters:  07/19/22 33.84 kg/m  06/04/22 33.47 kg/m  04/14/22 33.11 kg/m      New complaints: None today  Allergies  Allergen Reactions   Asa Arthritis Strength-Antacid [Aspirin Buffered] Anaphylaxis    GI upset   Ace  Inhibitors Cough   Atorvastatin Other (See Comments)   Nsaids    Outpatient Encounter Medications as of 07/19/2022  Medication Sig   Biotin-D POWD by Does not apply route.   Calcium Carbonate (CALCIUM 500 PO) Take by mouth.   Cholecalciferol (VITAMIN D) 50 MCG (2000 UT) CAPS Take 1 capsule by mouth once daily   ibuprofen (ADVIL,MOTRIN) 200 MG tablet Take 200 mg by mouth every 6 (six) hours as needed.   losartan (COZAAR) 100 MG tablet Take 1 tablet (100 mg total) by mouth daily.   Magnesium 200 MG TABS Take by mouth.   metFORMIN (GLUCOPHAGE) 500 MG tablet Take 1 tablet (500 mg total) by mouth daily with breakfast.   rosuvastatin (CRESTOR) 10 MG tablet Take 1 tablet (10 mg total) by mouth daily.   [DISCONTINUED] Omega-3 Fatty Acids (OMEGA 3 PO) Take by mouth.   [DISCONTINUED] ezetimibe (ZETIA) 10 MG tablet Take 1 tablet (10 mg total) by mouth daily.   No facility-administered encounter medications on file as of 07/19/2022.    Past Surgical History:  Procedure Laterality Date   BELPHAROPTOSIS REPAIR Right 07/23/2019   CARPAL TUNNEL RELEASE Right    CATARACT PEDIATRIC     right    Family History  Problem Relation Age of Onset   Diabetes Mother    Hypertension Mother    Asthma Father    Arthritis Father  Controlled substance contract: n/a     Review of Systems  Constitutional:  Negative for diaphoresis.  Eyes:  Negative for pain.  Respiratory:  Negative for shortness of breath.   Cardiovascular:  Negative for chest pain, palpitations and leg swelling.  Gastrointestinal:  Negative for abdominal pain.  Endocrine: Negative for polydipsia.  Skin:  Negative for rash.  Neurological:  Negative for dizziness, weakness and headaches.  Hematological:  Does not bruise/bleed easily.  All other systems reviewed and are negative.      Objective:   Physical Exam Vitals and nursing note reviewed.  Constitutional:      General: She is not in acute distress.    Appearance:  Normal appearance. She is well-developed.  HENT:     Head: Normocephalic.     Right Ear: Tympanic membrane normal.     Left Ear: Tympanic membrane normal.     Nose: Nose normal.     Mouth/Throat:     Mouth: Mucous membranes are moist.  Eyes:     Pupils: Pupils are equal, round, and reactive to light.  Neck:     Vascular: No carotid bruit or JVD.  Cardiovascular:     Rate and Rhythm: Normal rate and regular rhythm.     Heart sounds: Normal heart sounds.  Pulmonary:     Effort: Pulmonary effort is normal. No respiratory distress.     Breath sounds: Normal breath sounds. No wheezing or rales.  Chest:     Chest wall: No tenderness.  Abdominal:     General: Bowel sounds are normal. There is no distension or abdominal bruit.     Palpations: Abdomen is soft. There is no hepatomegaly, splenomegaly, mass or pulsatile mass.     Tenderness: There is no abdominal tenderness.  Musculoskeletal:        General: Normal range of motion.     Cervical back: Normal range of motion and neck supple.  Lymphadenopathy:     Cervical: No cervical adenopathy.  Skin:    General: Skin is warm and dry.  Neurological:     Mental Status: She is alert and oriented to person, place, and time.     Deep Tendon Reflexes: Reflexes are normal and symmetric.  Psychiatric:        Behavior: Behavior normal.        Thought Content: Thought content normal.        Judgment: Judgment normal.     BP 122/71   Pulse 77   Temp 97.9 F (36.6 C) (Temporal)   Resp 20   Ht '5\' 2"'$  (1.575 m)   Wt 185 lb (83.9 kg)   LMP 04/04/2005   SpO2 95%   BMI 33.84 kg/m        Assessment & Plan:   Katherine Ryan comes in today with chief complaint of Medical Management of Chronic Issues   Diagnosis and orders addressed:  1. Hyperlipidemia with target LDL less than 100 Low fat diet  2. Type 2 diabetes mellitus without complication, without long-term current use of insulin (HCC) Continue to watch carbsin diet  3.  Hypertension associated with diabetes ([place]) Low sodium diet  4. Gastroesophageal reflux disease without esophagitis Avoid spicy foods Do not eat 2 hours prior to bedtime  5. Osteopenia of lumbar spine Weight bearing exercises  6. Vitamin D deficiency Continue daily vitamin d supplement  7. BMI 33.0-33.9,adult Discussed diet and exercise for person with BMI >25 Will recheck weight in 3-6 months    Labs pending  Health Maintenance reviewed Diet and exercise encouraged  Follow up plan: 3 months   [name], FNP

## 2022-07-21 ENCOUNTER — Encounter: Payer: Self-pay | Admitting: Cardiology

## 2022-07-21 ENCOUNTER — Ambulatory Visit (INDEPENDENT_AMBULATORY_CARE_PROVIDER_SITE_OTHER): Payer: Medicare HMO | Admitting: Cardiology

## 2022-07-21 VITALS — Ht 62.0 in | Wt 183.8 lb

## 2022-07-21 DIAGNOSIS — I152 Hypertension secondary to endocrine disorders: Secondary | ICD-10-CM

## 2022-07-21 DIAGNOSIS — E1159 Type 2 diabetes mellitus with other circulatory complications: Secondary | ICD-10-CM | POA: Diagnosis not present

## 2022-07-21 DIAGNOSIS — R42 Dizziness and giddiness: Secondary | ICD-10-CM

## 2022-07-21 DIAGNOSIS — E785 Hyperlipidemia, unspecified: Secondary | ICD-10-CM | POA: Diagnosis not present

## 2022-07-21 NOTE — Patient Instructions (Signed)
Medication Instructions:  Continue all current medications.  Labwork: none  Testing/Procedures: none  Follow-Up:  As needed   Any Other Special Instructions Will Be Listed Below (If Applicable). You have been referred to:  [facility]   If you need a refill on your cardiac medications before your next appointment, please call your pharmacy.

## 2022-07-21 NOTE — Progress Notes (Signed)
Clinical Summary Ms. Enriques is a 66 y.o.female seen today as a new consult, referred by NP Hassell Done for the following medical problems.     1.Lightheadedness  - symptoms started in October - episodes tend to occur in the AM.  - about 1.5-2 hrs after waking up  -lightheaded feeling comes while sitting. During episodes vision get blurry. Mild heaviness in back of head. Gets better with getting up and becoming active in the house. Often comes on with playing games on phone     2. Hyperlipidemia - side effects on lipitor, zetia, now on crestor '5mg'$  once a week.    Past Medical History:  Diagnosis Date   Blindness of right eye with low vision in contralateral eye    Diabetes mellitus without complication (HCC)    GERD (gastroesophageal reflux disease)    Hyperlipidemia      Allergies  Allergen Reactions   Asa Arthritis Strength-Antacid [Aspirin Buffered] Anaphylaxis    GI upset   Ace Inhibitors Cough   Atorvastatin Other (See Comments)   Nsaids      Current Outpatient Medications  Medication Sig Dispense Refill   Biotin-D POWD by Does not apply route.     Calcium Carbonate (CALCIUM 500 PO) Take by mouth.     Cholecalciferol (VITAMIN D) 50 MCG (2000 UT) CAPS Take 1 capsule by mouth once daily 90 capsule 0   ibuprofen (ADVIL,MOTRIN) 200 MG tablet Take 200 mg by mouth every 6 (six) hours as needed.     losartan (COZAAR) 100 MG tablet Take 1 tablet (100 mg total) by mouth daily. 90 tablet 1   Magnesium 200 MG TABS Take by mouth.     metFORMIN (GLUCOPHAGE) 500 MG tablet Take 1 tablet (500 mg total) by mouth daily with breakfast. 90 tablet 1   rosuvastatin (CRESTOR) 10 MG tablet Take 1 tablet (10 mg total) by mouth daily. 90 tablet 1   No current facility-administered medications for this visit.     Past Surgical History:  Procedure Laterality Date   BELPHAROPTOSIS REPAIR Right 07/23/2019   CARPAL TUNNEL RELEASE Right    CATARACT PEDIATRIC     right      Allergies  Allergen Reactions   Asa Arthritis Strength-Antacid [Aspirin Buffered] Anaphylaxis    GI upset   Ace Inhibitors Cough   Atorvastatin Other (See Comments)   Nsaids       Family History  Problem Relation Age of Onset   Diabetes Mother    Hypertension Mother    Asthma Father    Arthritis Father      Social History Ms. Redner reports that she quit smoking about 10 years ago. Her smoking use included cigarettes. She has a 52.50 pack-year smoking history. She has never used smokeless tobacco. Ms. Fikes reports no history of alcohol use.   Review of Systems CONSTITUTIONAL: No weight loss, fever, chills, weakness or fatigue.  HEENT: Eyes: No visual loss, blurred vision, double vision or yellow sclerae.No hearing loss, sneezing, congestion, runny nose or sore throat.  SKIN: No rash or itching.  CARDIOVASCULAR: per hpi RESPIRATORY: No shortness of breath, cough or sputum.  GASTROINTESTINAL: No anorexia, nausea, vomiting or diarrhea. No abdominal pain or blood.  GENITOURINARY: No burning on urination, no polyuria NEUROLOGICAL: per hpi MUSCULOSKELETAL: No muscle, back pain, joint pain or stiffness.  LYMPHATICS: No enlarged nodes. No history of splenectomy.  PSYCHIATRIC: No history of depression or anxiety.  ENDOCRINOLOGIC: No reports of sweating, cold or heat intolerance. No  polyuria or polydipsia.  Marland Kitchen   Physical Examination Today's Vitals   07/21/22 1454  SpO2: 93%  Weight: 183 lb 12.8 oz (83.4 kg)  Height: '5\' 2"'$  (1.575 m)   Body mass index is 33.62 kg/m.  Gen: resting comfortably, no acute distress HEENT: no scleral icterus, pupils equal round and reactive, no palptable cervical adenopathy,  CV: RRR, no m/r/g no jvd Resp: Clear to auscultation bilaterally GI: abdomen is soft, non-tender, non-distended, normal bowel sounds, no hepatosplenomegaly MSK: extremities are warm, no edema.  Skin: warm, no rash Neuro:  no focal deficits Psych: appropriate  affect     Assessment and Plan  1.Lightheadedness - unclear etiology, symptoms are not cardiac in description. Lightheadness and blurry vision that comes on with sitting, improves with getting up and walking.  - orhostatics are negative - no cardiac testing is indicated  2. Hyperlipidemia - difficulty tolerating statins, LDL above goal in diabetic patient - refer to lipid clinic to consider [id number]      Arnoldo Lenis, M.D.

## 2022-08-24 ENCOUNTER — Telehealth: Payer: Self-pay | Admitting: Nurse Practitioner

## 2022-08-24 DIAGNOSIS — R42 Dizziness and giddiness: Secondary | ICD-10-CM

## 2022-08-24 NOTE — Telephone Encounter (Signed)
Called and spoke with patient and she states that she recently saw cardiology and they couldn't find anything wrong cardiac. They suggested that she see neurology. MMM agrees and referral made and patient notified and verbalized understanding

## 2022-08-24 NOTE — Telephone Encounter (Signed)
Ok to do referral for second opinion

## 2022-08-26 ENCOUNTER — Encounter: Payer: Self-pay | Admitting: Pharmacist Clinician (PhC)/ Clinical Pharmacy Specialist

## 2022-08-26 ENCOUNTER — Ambulatory Visit: Payer: Medicare HMO | Attending: Cardiology | Admitting: Pharmacist Clinician (PhC)/ Clinical Pharmacy Specialist

## 2022-08-26 DIAGNOSIS — E785 Hyperlipidemia, unspecified: Secondary | ICD-10-CM | POA: Diagnosis not present

## 2022-08-26 NOTE — Progress Notes (Signed)
   08/26/2022 Katherine Ryan [birth year] [id number]   HPI:  Katherine Ryan is a 66 y.o. female patient of Dr Carlyle Dolly, who presents today for a lipid clinic evaluation.  See pertinent past medical history below.  She was referred to cardiology by her PCP for lightheadedness and familial hyperlipidemia.  Labs drawn last fall show her to have an LDL at 206.  She has tried both rosuvastatin and atorvastatin, however both caused her to have myalgias.  Most recently she tried the rosuvastatin just once weekly, but had to stop that for the same reasons.    Past Medical History: hypertension Controlled on losartan   DM2 1/23 A1c 6.5, now down to 6.3 (7/23) on metformin 500 mg daily    Current Medications: rosuvastatin 10 mg weekly  Cholesterol Goals: LDL < 100   Intolerant/previously tried: atorvastatin , rosuvastatin daily, ezetimibe - all myalgias   Family history: mother DM2, htn, hld died in 17's; father no CAD; 2 daughters, one already has borderline high LDL  Diet: has been trying to keep fats under 20 gms per day total, but was eating more fruits, which raised her A1c.  She is trying to find a balanced diet that will be both low fat and DM friendly.    Exercise:  15 min aerobics daily, recently did some hiking on vacation  Labs:  07/23:  TC 254, TG 103, HDL 81, LDL 155  (on rosuvastatin 10 mg weekly)  10/22:  TC 313, TG 134, HDL 83, LDL 206   Current Outpatient Medications  Medication Sig Dispense Refill   Biotin-D POWD by Does not apply route.     Calcium Carbonate (CALCIUM 500 PO) Take by mouth.     Cholecalciferol (VITAMIN D) 50 MCG (2000 UT) CAPS Take 1 capsule by mouth once daily 90 capsule 0   losartan (COZAAR) 100 MG tablet Take 1 tablet (100 mg total) by mouth daily. 90 tablet 1   Magnesium 200 MG TABS Take by mouth.     metFORMIN (GLUCOPHAGE) 500 MG tablet Take 1 tablet (500 mg total) by mouth daily with breakfast. 90 tablet 1   No current  facility-administered medications for this visit.    Allergies  Allergen Reactions   Asa Arthritis Strength-Antacid [Aspirin Buffered] Anaphylaxis    GI upset   Ace Inhibitors Cough   Atorvastatin Other (See Comments)   Nsaids     Past Medical History:  Diagnosis Date   Blindness of right eye with low vision in contralateral eye    Diabetes mellitus without complication (HCC)    GERD (gastroesophageal reflux disease)    Hyperlipidemia     Last menstrual period 04/04/2005.   Hyperlipidemia with target LDL less than 100 Patient with familial hyperlipidemia, LDL baseline at 206.  Unable to tolerate even once weekly rosuvastatin.  Reviewed options for lowering LDL cholesterol, including PCSK-9 inhibitors, bempedoic acid and inclisiran.  Discussed mechanisms of action, dosing, side effects and potential decreases in LDL cholesterol.  Also reviewed cost information and potential options for patient assistance.  Answered all patient questions.  Based on this information, patient would prefer to start PCSK-9 inhibitor.  Her insurance Licensed conveyancer) prefers Insurance risk surveyor.  She will do one injection every 14 days and repeat labs after 4-6 doses.    Tommy Medal PharmD CPP [facility] [address] [place], [place] [address] [phone]

## 2022-08-26 NOTE — Assessment & Plan Note (Signed)
Patient with familial hyperlipidemia, LDL baseline at 206.  Unable to tolerate even once weekly rosuvastatin.  Reviewed options for lowering LDL cholesterol, including PCSK-9 inhibitors, bempedoic acid and inclisiran.  Discussed mechanisms of action, dosing, side effects and potential decreases in LDL cholesterol.  Also reviewed cost information and potential options for patient assistance.  Answered all patient questions.  Based on this information, patient would prefer to start PCSK-9 inhibitor.  Her insurance Licensed conveyancer) prefers Insurance risk surveyor.  She will do one injection every 14 days and repeat labs after 4-6 doses.

## 2022-08-26 NOTE — Patient Instructions (Signed)
Your Results:             Your most recent labs Goal  Total Cholesterol 254 < 200  Triglycerides 103 < 150  HDL (happy/good cholesterol) 81 > 40  LDL (lousy/bad cholesterol 155 < 100   Medication changes:  We will start the process to get Praluent covered by your insurance.  Once the prior authorization is complete, [facility] will call you to let you know and confirm pharmacy information.   You will take one injection every 14 days.    Lab orders:  We want to repeat labs after 2-3 months.  We will send you a lab order to remind you once we get closer to that time.    Patient Assistance:  The [employer] foundation offers assistance to help pay for medication copays.  They will cover copays for all cholesterol lowering meds, including statins, fibrates, omega-3 oils, ezetimibe, Repatha, Praluent, Nexletol, Nexlizet.  The cards are usually good for $2,500 or 12 months, whichever comes first. Go to healthwellfoundation.org Click on "Apply Now" Answer questions as to whom is applying (patient or representative) Your disease fund will be "hypercholesterolemia - Medicare access" They will ask questions about finances and which medications you are taking for cholesterol When you submit, the approval is usually within minutes.  You will need to print the card information from the site You will need to show this information to your pharmacy, they will bill your Medicare Part D plan first -then bill [employer] Well --for the copay.   You can also call them at [phone], although the hold times can be quite long.   Thank you for choosing [facility]

## 2022-09-02 ENCOUNTER — Telehealth: Payer: Self-pay | Admitting: Nurse Practitioner

## 2022-09-02 NOTE — Telephone Encounter (Signed)
Called and spoke with patient. She states that neurology office contacted her yesterday about referral and stated that they can't make her an appt until they receive office notes. I attempted to contact the office and they had already closed. I messaged our referral dept to see if any notes have been sent because it appears we are connected in Epic. Advised patient that we would send over whatever they need. Patient verbalized understanding

## 2022-09-10 ENCOUNTER — Encounter: Payer: Self-pay | Admitting: Neurology

## 2022-09-15 ENCOUNTER — Encounter: Payer: Self-pay | Admitting: Pharmacist Clinician (PhC)/ Clinical Pharmacy Specialist

## 2022-09-15 MED ORDER — PRALUENT 150 MG/ML ~~LOC~~ SOAJ: 150.0000 mg | SUBCUTANEOUS | 12 refills | Status: DC

## 2022-10-12 ENCOUNTER — Telehealth: Payer: Self-pay | Admitting: Nurse Practitioner

## 2022-10-12 ENCOUNTER — Other Ambulatory Visit: Payer: Self-pay | Admitting: Family Medicine

## 2022-10-12 DIAGNOSIS — E785 Hyperlipidemia, unspecified: Secondary | ICD-10-CM

## 2022-10-12 DIAGNOSIS — E119 Type 2 diabetes mellitus without complications: Secondary | ICD-10-CM

## 2022-10-12 DIAGNOSIS — E1159 Type 2 diabetes mellitus with other circulatory complications: Secondary | ICD-10-CM

## 2022-10-12 NOTE — Telephone Encounter (Signed)
Orders placed.

## 2022-10-13 ENCOUNTER — Other Ambulatory Visit: Payer: Medicare HMO

## 2022-10-13 DIAGNOSIS — E119 Type 2 diabetes mellitus without complications: Secondary | ICD-10-CM

## 2022-10-13 DIAGNOSIS — I152 Hypertension secondary to endocrine disorders: Secondary | ICD-10-CM | POA: Diagnosis not present

## 2022-10-13 DIAGNOSIS — E785 Hyperlipidemia, unspecified: Secondary | ICD-10-CM

## 2022-10-13 DIAGNOSIS — E1159 Type 2 diabetes mellitus with other circulatory complications: Secondary | ICD-10-CM | POA: Diagnosis not present

## 2022-10-13 LAB — CBC WITH DIFFERENTIAL/PLATELET
Basophils Absolute: 0 10*3/uL (ref 0.0–0.2)
Basos: 1 %
EOS (ABSOLUTE): 0.2 10*3/uL (ref 0.0–0.4)
Eos: 2 %
Hematocrit: 42.5 % (ref 34.0–46.6)
Hemoglobin: 14.2 g/dL (ref 11.1–15.9)
Immature Grans (Abs): 0 10*3/uL (ref 0.0–0.1)
Immature Granulocytes: 0 %
Lymphocytes Absolute: 1.6 10*3/uL (ref 0.7–3.1)
Lymphs: 24 %
MCH: 29.8 pg (ref 26.6–33.0)
MCHC: 33.4 g/dL (ref 31.5–35.7)
MCV: 89 fL (ref 79–97)
Monocytes Absolute: 0.7 10*3/uL (ref 0.1–0.9)
Monocytes: 11 %
Neutrophils Absolute: 4.1 10*3/uL (ref 1.4–7.0)
Neutrophils: 62 %
Platelets: 264 10*3/uL (ref 150–450)
RBC: 4.76 x10E6/uL (ref 3.77–5.28)
RDW: 13 % (ref 11.7–15.4)
WBC: 6.6 10*3/uL (ref 3.4–10.8)

## 2022-10-13 LAB — CMP14+EGFR
ALT: 49 IU/L — ABNORMAL HIGH (ref 0–32)
AST: 31 IU/L (ref 0–40)
Albumin/Globulin Ratio: 1.4 (ref 1.2–2.2)
Albumin: 4.5 g/dL (ref 3.9–4.9)
Alkaline Phosphatase: 96 IU/L (ref 44–121)
BUN/Creatinine Ratio: 24 (ref 12–28)
BUN: 17 mg/dL (ref 8–27)
Bilirubin Total: 0.3 mg/dL (ref 0.0–1.2)
CO2: 25 mmol/L (ref 20–29)
Calcium: 9.6 mg/dL (ref 8.7–10.3)
Chloride: 100 mmol/L (ref 96–106)
Creatinine, Ser: 0.71 mg/dL (ref 0.57–1.00)
Globulin, Total: 3.2 g/dL (ref 1.5–4.5)
Glucose: 128 mg/dL — ABNORMAL HIGH (ref 70–99)
Potassium: 5 mmol/L (ref 3.5–5.2)
Sodium: 140 mmol/L (ref 134–144)
Total Protein: 7.7 g/dL (ref 6.0–8.5)
eGFR: 94 mL/min/{1.73_m2} (ref >59)

## 2022-10-13 LAB — LIPID PANEL
Chol/HDL Ratio: 2.4 ratio (ref 0.0–4.4)
Cholesterol, Total: 186 mg/dL (ref 100–199)
HDL: 76 mg/dL (ref >39)
LDL Chol Calc (NIH): 85 mg/dL (ref 0–99)
Triglycerides: 147 mg/dL (ref 0–149)
VLDL Cholesterol Cal: 25 mg/dL (ref 5–40)

## 2022-10-13 LAB — BAYER DCA HB A1C WAIVED: HB A1C (BAYER DCA - WAIVED): 6.4 % — ABNORMAL HIGH (ref 4.8–5.6)

## 2022-10-14 ENCOUNTER — Ambulatory Visit (INDEPENDENT_AMBULATORY_CARE_PROVIDER_SITE_OTHER): Payer: Medicare HMO | Admitting: Nurse Practitioner

## 2022-10-14 ENCOUNTER — Encounter: Payer: Self-pay | Admitting: Nurse Practitioner

## 2022-10-14 VITALS — BP 135/76 | HR 73 | Temp 97.8°F | Resp 20 | Ht 62.0 in | Wt 189.0 lb

## 2022-10-14 DIAGNOSIS — E559 Vitamin D deficiency, unspecified: Secondary | ICD-10-CM | POA: Diagnosis not present

## 2022-10-14 DIAGNOSIS — E119 Type 2 diabetes mellitus without complications: Secondary | ICD-10-CM | POA: Diagnosis not present

## 2022-10-14 DIAGNOSIS — E1159 Type 2 diabetes mellitus with other circulatory complications: Secondary | ICD-10-CM | POA: Diagnosis not present

## 2022-10-14 DIAGNOSIS — E785 Hyperlipidemia, unspecified: Secondary | ICD-10-CM

## 2022-10-14 DIAGNOSIS — Z6834 Body mass index (BMI) 34.0-34.9, adult: Secondary | ICD-10-CM

## 2022-10-14 DIAGNOSIS — I152 Hypertension secondary to endocrine disorders: Secondary | ICD-10-CM | POA: Diagnosis not present

## 2022-10-14 DIAGNOSIS — K219 Gastro-esophageal reflux disease without esophagitis: Secondary | ICD-10-CM | POA: Diagnosis not present

## 2022-10-14 DIAGNOSIS — M8588 Other specified disorders of bone density and structure, other site: Secondary | ICD-10-CM

## 2022-10-14 MED ORDER — METFORMIN HCL 500 MG PO TABS: 500.0000 mg | ORAL_TABLET | Freq: Every day | ORAL | 1 refills | Status: DC

## 2022-10-14 MED ORDER — LOSARTAN POTASSIUM 100 MG PO TABS: 100.0000 mg | ORAL_TABLET | Freq: Every day | ORAL | 1 refills | Status: DC

## 2022-10-14 MED ORDER — VITAMIN D 50 MCG (2000 UT) PO CAPS: 1.0000 | ORAL_CAPSULE | Freq: Every day | ORAL | 1 refills | Status: DC

## 2022-10-14 NOTE — Patient Instructions (Signed)
Exercising to Stay Healthy To become healthy and stay healthy, it is recommended that you do moderate-intensity and vigorous-intensity exercise. You can tell that you are exercising at a moderate intensity if your heart starts beating faster and you start breathing faster but can still hold a conversation. You can tell that you are exercising at a vigorous intensity if you are breathing much harder and faster and cannot hold a conversation while exercising. How can exercise benefit me? Exercising regularly is important. It has many health benefits, such as: Improving overall fitness, flexibility, and endurance. Increasing bone density. Helping with weight control. Decreasing body fat. Increasing muscle strength and endurance. Reducing stress and tension, anxiety, depression, or anger. Improving overall health. What guidelines should I follow while exercising? Before you start a new exercise program, talk with your health care provider. Do not exercise so much that you hurt yourself, feel dizzy, or get very short of breath. Wear comfortable clothes and wear shoes with good support. Drink plenty of water while you exercise to prevent dehydration or heat stroke. Work out until your breathing and your heartbeat get faster (moderate intensity). How often should I exercise? Choose an activity that you enjoy, and set realistic goals. Your health care provider can help you make an activity plan that is individually designed and works best for you. Exercise regularly as told by your health care provider. This may include: Doing strength training two times a week, such as: Lifting weights. Using resistance bands. Push-ups. Sit-ups. Yoga. Doing a certain intensity of exercise for a given amount of time. Choose from these options: A total of 150 minutes of moderate-intensity exercise every week. A total of 75 minutes of vigorous-intensity exercise every week. A mix of moderate-intensity and  vigorous-intensity exercise every week. Children, pregnant women, people who have not exercised regularly, people who are overweight, and older adults may need to talk with a health care provider about what activities are safe to perform. If you have a medical condition, be sure to talk with your health care provider before you start a new exercise program. What are some exercise ideas? Moderate-intensity exercise ideas include: Walking 1 mile (1.6 km) in about 15 minutes. Biking. Hiking. Golfing. Dancing. Water aerobics. Vigorous-intensity exercise ideas include: Walking 4.5 miles (7.2 km) or more in about 1 hour. Jogging or running 5 miles (8 km) in about 1 hour. Biking 10 miles (16.1 km) or more in about 1 hour. Lap swimming. Roller-skating or in-line skating. Cross-country skiing. Vigorous competitive sports, such as football, basketball, and soccer. Jumping rope. Aerobic dancing. What are some everyday activities that can help me get exercise? Yard work, such as: Pushing a lawn mower. Raking and bagging leaves. Washing your car. Pushing a stroller. Shoveling snow. Gardening. Washing windows or floors. How can I be more active in my day-to-day activities? Use stairs instead of an elevator. Take a walk during your lunch break. If you drive, park your car farther away from your work or school. If you take public transportation, get off one stop early and walk the rest of the way. Stand up or walk around during all of your indoor phone calls. Get up, stretch, and walk around every 30 minutes throughout the day. Enjoy exercise with a friend. Support to continue exercising will help you keep a regular routine of activity. Where to find more information You can find more information about exercising to stay healthy from: U.S. Department of Health and Human Services: www.hhs.gov Centers for Disease Control and Prevention (  CDC): www.cdc.gov Summary Exercising regularly is  important. It will improve your overall fitness, flexibility, and endurance. Regular exercise will also improve your overall health. It can help you control your weight, reduce stress, and improve your bone density. Do not exercise so much that you hurt yourself, feel dizzy, or get very short of breath. Before you start a new exercise program, talk with your health care provider. This information is not intended to replace advice given to you by your health care provider. Make sure you discuss any questions you have with your health care provider. Document Revised: 04/10/2021 Document Reviewed: 04/10/2021 Elsevier Patient Education  2023 Elsevier Inc.  

## 2022-10-14 NOTE — Progress Notes (Signed)
Subjective:    Patient ID: Katherine Ryan, female    DOB: Jun 01, 1956, 66 y.o.   MRN: [id number]   Chief Complaint: medical management of chronic issues     HPI:  Katherine Ryan is a 66 y.o. who identifies as a female who was assigned female at birth.   Social history: Lives with: husband Work history: retired   Scientist, forensic in today for follow up of the following chronic medical issues:  1. Hypertension associated with diabetes ([place]) No c/o chest pain, sob or headache. Does not check blood pressure at home. BP Readings from Last 3 Encounters:  07/19/22 122/71  06/04/22 (!) 156/86  04/14/22 133/71     2. Hyperlipidemia with target LDL less than 100 Does try to watch diet. No dedicated exercise. Lab Results  Component Value Date   CHOL 186 10/13/2022   HDL 76 10/13/2022   LDLCALC 85 10/13/2022   TRIG 147 10/13/2022   CHOLHDL 2.4 10/13/2022     3. Gastroesophageal reflux disease without esophagitis Is on no prescription meds  4. Type 2 diabetes mellitus without complication, without long-term current use of insulin (HCC) Fasting blood sugars are running around 100-130. No low blood sugars Lab Results  Component Value Date   HGBA1C 6.4 (H) 10/13/2022     5. Osteopenia of lumbar spine Last dexascan was done on 03/30/21. Her t score was -1.4. she does no weight bearing exercise.  6. Vitamin D deficiency Is on daily vitamin d supplement  7. BMI 33.0-33.9,adult Weight is up 6lbs Wt Readings from Last 3 Encounters:  10/14/22 189 lb (85.7 kg)  07/21/22 183 lb 12.8 oz (83.4 kg)  07/19/22 185 lb (83.9 kg)   BMI Readings from Last 3 Encounters:  10/14/22 34.57 kg/m  07/21/22 33.62 kg/m  07/19/22 33.84 kg/m      New complaints: None today  Allergies  Allergen Reactions   Asa Arthritis Strength-Antacid [Aspirin Buffered] Anaphylaxis    GI upset   Ace Inhibitors Cough   Atorvastatin Other (See Comments)   Nsaids    Outpatient Encounter Medications as of  10/14/2022  Medication Sig   Alirocumab (PRALUENT) 150 MG/ML SOAJ Inject 150 mg into the skin every 14 (fourteen) days.   Biotin-D POWD by Does not apply route.   Calcium Carbonate (CALCIUM 500 PO) Take by mouth.   Cholecalciferol (VITAMIN D) 50 MCG (2000 UT) CAPS Take 1 capsule by mouth once daily   losartan (COZAAR) 100 MG tablet Take 1 tablet (100 mg total) by mouth daily.   Magnesium 200 MG TABS Take by mouth.   metFORMIN (GLUCOPHAGE) 500 MG tablet Take 1 tablet (500 mg total) by mouth daily with breakfast.   No facility-administered encounter medications on file as of 10/14/2022.    Past Surgical History:  Procedure Laterality Date   BELPHAROPTOSIS REPAIR Right 07/23/2019   CARPAL TUNNEL RELEASE Right    CATARACT PEDIATRIC     right    Family History  Problem Relation Age of Onset   Diabetes Mother    Hypertension Mother    Asthma Father    Arthritis Father       Controlled substance contract: n/a     Review of Systems  Constitutional:  Negative for diaphoresis.  Eyes:  Negative for pain.  Respiratory:  Negative for shortness of breath.   Cardiovascular:  Negative for chest pain, palpitations and leg swelling.  Gastrointestinal:  Negative for abdominal pain.  Endocrine: Negative for polydipsia.  Skin:  Negative for rash.  Neurological:  Negative for dizziness, weakness and headaches.  Hematological:  Does not bruise/bleed easily.  All other systems reviewed and are negative.      Objective:   Physical Exam Vitals and nursing note reviewed.  Constitutional:      General: She is not in acute distress.    Appearance: Normal appearance. She is well-developed.  HENT:     Head: Normocephalic.     Right Ear: Tympanic membrane normal.     Left Ear: Tympanic membrane normal.     Nose: Nose normal.     Mouth/Throat:     Mouth: Mucous membranes are moist.  Eyes:     Pupils: Pupils are equal, round, and reactive to light.  Neck:     Vascular: No carotid  bruit or JVD.  Cardiovascular:     Rate and Rhythm: Normal rate and regular rhythm.     Heart sounds: Normal heart sounds.  Pulmonary:     Effort: Pulmonary effort is normal. No respiratory distress.     Breath sounds: Normal breath sounds. No wheezing or rales.  Chest:     Chest wall: No tenderness.  Abdominal:     General: Bowel sounds are normal. There is no distension or abdominal bruit.     Palpations: Abdomen is soft. There is no hepatomegaly, splenomegaly, mass or pulsatile mass.     Tenderness: There is no abdominal tenderness.  Musculoskeletal:        General: Normal range of motion.     Cervical back: Normal range of motion and neck supple.  Lymphadenopathy:     Cervical: No cervical adenopathy.  Skin:    General: Skin is warm and dry.  Neurological:     Mental Status: She is alert and oriented to person, place, and time.     Deep Tendon Reflexes: Reflexes are normal and symmetric.  Psychiatric:        Behavior: Behavior normal.        Thought Content: Thought content normal.        Judgment: Judgment normal.     BP 135/76   Pulse 73   Temp 97.8 F (36.6 C) (Temporal)   Resp 20   Ht '5\' 2"'$  (1.575 m)   Wt 189 lb (85.7 kg)   LMP 04/04/2005   SpO2 94%   BMI 34.57 kg/m        Assessment & Plan:   Katherine Ryan comes in today with chief complaint of Medical Management of Chronic Issues   Diagnosis and orders addressed:  1. Hypertension associated with diabetes ([place]) Low sodium diet - losartan (COZAAR) 100 MG tablet; Take 1 tablet (100 mg total) by mouth daily.  Dispense: 90 tablet; Refill: 1  2. Hyperlipidemia with target LDL less than 100 Low fat diet  3. Gastroesophageal reflux disease without esophagitis Avoid spicy foods Do not eat 2 hours prior to bedtime  4. Type 2 diabetes mellitus without complication, without long-term current use of insulin (HCC) Continue to watch carbs in diet - metFORMIN (GLUCOPHAGE) 500 MG tablet; Take 1 tablet (500  mg total) by mouth daily with breakfast.  Dispense: 90 tablet; Refill: 1  5. Osteopenia of lumbar spine Weight bearing exrcises encouraged  6. Vitamin D deficiency Continue daily vitamin d supplement - Cholecalciferol (VITAMIN D) 50 MCG (2000 UT) CAPS; Take 1 capsule (2,000 Units total) by mouth daily.  Dispense: 90 capsule; Refill: 1  7. BMI 34.0-34.9,adult Discussed diet and exercise for person with BMI >25 Will recheck weight  in 3-6 months    Labs pending Health Maintenance reviewed Diet and exercise encouraged  Follow up plan: 3 months   [name], FNP

## 2022-10-19 ENCOUNTER — Ambulatory Visit: Payer: Medicare HMO | Admitting: Nurse Practitioner

## 2022-10-19 ENCOUNTER — Encounter: Payer: Self-pay | Admitting: Neurology

## 2022-10-19 ENCOUNTER — Ambulatory Visit: Payer: Medicare HMO | Admitting: Neurology

## 2022-10-19 VITALS — BP 161/83 | HR 76 | Ht 63.0 in | Wt 189.4 lb

## 2022-10-19 DIAGNOSIS — R4789 Other speech disturbances: Secondary | ICD-10-CM | POA: Diagnosis not present

## 2022-10-19 DIAGNOSIS — R42 Dizziness and giddiness: Secondary | ICD-10-CM | POA: Diagnosis not present

## 2022-10-19 DIAGNOSIS — R519 Headache, unspecified: Secondary | ICD-10-CM | POA: Diagnosis not present

## 2022-10-19 NOTE — Progress Notes (Signed)
NEUROLOGY CONSULTATION NOTE  Katherine Ryan MRN: [id number] DOB: 1956-11-20  Referring provider: Chevis Pretty, FNP Primary care provider: Chevis Pretty, FNP  Reason for consult:  lightheadedness   Thank you for your kind referral of Katherine Ryan for consultation of the above symptoms. Although her history is well known to you, please allow me to reiterate it for the purpose of our medical record. The patient was accompanied to the clinic by her daughter who also provides collateral information. Records and images were personally reviewed where available.   HISTORY OF PRESENT ILLNESS: This is a pleasant 66 year old right-handed woman with a history of hypertension, hyperlipidemia, DM, right vision loss, presenting for evaluation of lightheadedness. She was previously noticing blurry vision but not to the point where she could not keep up with what she was doing. The first time she noticed something amiss was in May, then she started paying attention. She reports that when she is sitting, vision gets blurred and she feels a pressure behind her head. If she continues to sit, she gets even more dizzy, and if it is prolonged (>2 hours), she would not be able to focus on what she needs to, unable to remember what she had to take out if she were cooking. One time she was at a doctor's office and had to read the intake form 2-3 times, which is new for her. She had to go closer to her laptop and saw her eye doctor in December with unremarkable exam. She would feel a pressure first, then this resolves when she gets up and walks around. If she gets up, she would feel the pressure but would not have the cognitive changes, and symptoms improve as she keeps moving. If she is cooking with her head bent down, she does not feel anything, but if she is sitting at a computer she would feel the dizziness. She notices if she sits for prolonged periods of time, her blood pressure increases. When she  checks BP in the morning, it is fine. One time they were in the car and halfway they had to stop because she could not find her words. She was not feeling the blurred vision or head pressure that time. She denies any nausea/vomiting, focal numbness/tingling/weakness. No loss of consciousness. She denies any neck pain. She tries to get 7-7.5 hrs of sleep. She notices that if her BP is in the 170s, she has a harder time sleeping, "like brain does not calm down." She lives with her husband with dementia and is his primary caregiver. Her memory has not been as good recently, she manages both their medications without issues. She was evaluated by Cardiology, orthostatics were negative, no cardiac testing indicated.    PAST MEDICAL HISTORY: Past Medical History:  Diagnosis Date   Blindness of right eye with low vision in contralateral eye    Diabetes mellitus without complication (HCC)    GERD (gastroesophageal reflux disease)    Hyperlipidemia     PAST SURGICAL HISTORY: Past Surgical History:  Procedure Laterality Date   BELPHAROPTOSIS REPAIR Right 07/23/2019   CARPAL TUNNEL RELEASE Right    CATARACT PEDIATRIC     right    MEDICATIONS: Current Outpatient Medications on File Prior to Visit  Medication Sig Dispense Refill   Alirocumab (PRALUENT) 150 MG/ML SOAJ Inject 150 mg into the skin every 14 (fourteen) days. 2 mL 12   Biotin-D POWD by Does not apply route.     Calcium Carbonate (CALCIUM 500 PO) Take by  mouth.     Cholecalciferol (VITAMIN D) 50 MCG (2000 UT) CAPS Take 1 capsule (2,000 Units total) by mouth daily. 90 capsule 1   losartan (COZAAR) 100 MG tablet Take 1 tablet (100 mg total) by mouth daily. 90 tablet 1   Magnesium 200 MG TABS Take by mouth.     metFORMIN (GLUCOPHAGE) 500 MG tablet Take 1 tablet (500 mg total) by mouth daily with breakfast. 90 tablet 1   No current facility-administered medications on file prior to visit.    ALLERGIES: Allergies  Allergen Reactions    Asa Arthritis Strength-Antacid [Aspirin Buffered] Anaphylaxis    GI upset   Ace Inhibitors Cough   Atorvastatin Other (See Comments)   Nsaids     FAMILY HISTORY: Family History  Problem Relation Age of Onset   Diabetes Mother    Hypertension Mother    Asthma Father    Arthritis Father     SOCIAL HISTORY: Social History   Socioeconomic History   Marital status: Married    Spouse name: Not on file   Number of children: 2   Years of education: Not on file   Highest education level: Not on file  Occupational History   Occupation: Psychologist, educational  Tobacco Use   Smoking status: Former    Packs/day: 1.50    Years: 35.00    Total pack years: 52.50    Types: Cigarettes    Quit date: 10/13/2011    Years since quitting: 11.0   Smokeless tobacco: Never  Vaping Use   Vaping Use: Never used  Substance and Sexual Activity   Alcohol use: No    Alcohol/week: 0.0 standard drinks of alcohol   Drug use: No   Sexual activity: Not on file  Other Topics Concern   Not on file  Social History Narrative   Right handed    Social Determinants of Health   Financial Resource Strain: Not on file  Food Insecurity: Not on file  Transportation Needs: Not on file  Physical Activity: Not on file  Stress: Not on file  Social Connections: Not on file  Intimate Partner Violence: Not on file     PHYSICAL EXAM: Vitals:   10/19/22 0841  BP: (!) 159/80  Pulse: 76  SpO2: 97%   BP on left arm 159/64 BP on right arm 161/83  General: No acute distress Head:  Normocephalic/atraumatic Skin/Extremities: No rash, no edema Neurological Exam: Mental status: alert and oriented to person, place, and time, no dysarthria or aphasia, Fund of knowledge is appropriate.  Recent and remote memory are intact.  Attention and concentration are normal.  Cranial nerves: CN I: not tested CN II: left pupil round, right prosthetic eye, visual fields intact on left CN III, IV, VI:  full range of motion on left,  no nystagmus, no ptosis CN V: facial sensation intact CN VII: upper and lower face symmetric CN VIII: hearing intact to conversation Bulk & Tone: normal, no fasciculations. Motor: 5/5 throughout with no pronator drift. Sensation: intact to light touch, cold, pin, vibration sense.  No extinction to double simultaneous stimulation.  Romberg test negative Deep Tendon Reflexes: brisk +2 throughout Cerebellar: no incoordination on finger to nose testing Gait: narrow-based and steady, mild difficulty with tandem walk but able Tremor: none   IMPRESSION: This is a pleasant 66 year old right-handed woman with a history of hypertension, hyperlipidemia, DM, right vision loss, presenting for evaluation of lightheadedness. She reports symptoms worsen if she has been sitting for prolonged periods and  improve once she moves around. If she sits for prolonged periods, she gets lightheaded, blurred vision, head pressure, and she would also have cognitive changes. Her neurological exam today is normal. Etiology of symptoms unclear. We discussed doing a brain MRI without contrast to assess for underlying structural abnormality. MRA head and neck will be ordered to assess for flow-limiting stenosis, she repeatedly notes changes in symptoms with position, worse when sitting. There is a 19-point difference in DBP on left and arm right. Continue to keep BP log. Follow-up after tests, call for any changes.    Thank you for allowing me to participate in the care of this patient. Please do not hesitate to call for any questions or concerns.   Ellouise Newer, M.D.  CC: [name], FNP

## 2022-10-25 NOTE — Patient Instructions (Signed)
Good to meet you.  Schedule MRI brain without contrast  2. Schedule MRA head without contrast and MRA neck with and without contrast  3. Continue BP log  4. Follow-up after tests, call for any changes

## 2022-11-12 ENCOUNTER — Ambulatory Visit
Admission: RE | Admit: 2022-11-12 | Discharge: 2022-11-12 | Disposition: A | Discharge status: Home / Self Care | Payer: Medicare HMO | Source: Ambulatory Visit | Attending: Neurology | Admitting: Neurology

## 2022-11-12 DIAGNOSIS — H532 Diplopia: Secondary | ICD-10-CM | POA: Diagnosis not present

## 2022-11-12 DIAGNOSIS — R519 Headache, unspecified: Secondary | ICD-10-CM

## 2022-11-12 DIAGNOSIS — R4789 Other speech disturbances: Secondary | ICD-10-CM

## 2022-11-12 DIAGNOSIS — R42 Dizziness and giddiness: Secondary | ICD-10-CM

## 2022-11-12 DIAGNOSIS — I771 Stricture of artery: Secondary | ICD-10-CM | POA: Diagnosis not present

## 2022-11-12 DIAGNOSIS — D170 Benign lipomatous neoplasm of skin and subcutaneous tissue of head, face and neck: Secondary | ICD-10-CM | POA: Diagnosis not present

## 2022-11-16 ENCOUNTER — Telehealth: Payer: Self-pay | Admitting: Nurse Practitioner

## 2022-11-16 NOTE — Telephone Encounter (Signed)
Pt says that she got a letter in mail from insurance saying that Alirocumab ([place]) 150 MG/ML SOAJ not be covered in 2024 and will need to repatha. Please call back

## 2022-11-16 NOTE — Telephone Encounter (Signed)
We will just switch you at next visit

## 2022-11-16 NOTE — Telephone Encounter (Signed)
Patient notified and verbalized understanding. 

## 2022-11-26 ENCOUNTER — Telehealth: Payer: Self-pay

## 2022-11-26 DIAGNOSIS — G458 Other transient cerebral ischemic attacks and related syndromes: Secondary | ICD-10-CM

## 2022-11-26 NOTE — Telephone Encounter (Signed)
Pt called informed it looks like there is decreased blood flow in one of the blood vessels in the neck, which may be causing her symptoms. Dr Delice Lesch would like for her to be seen by the Vascular specialist to weigh in if any further studies will help evaluate this. referral to Vascular Surgery, dx: concern for subclavian steal placed in Epic

## 2022-11-26 NOTE — Telephone Encounter (Signed)
-----   Message from Cameron Sprang, MD sent at 11/25/2022  4:05 PM EST ----- Pls let her know it looks like there is decreased blood flow in one of the blood vessels in the neck, which may be causing her symptoms. I'd like for her to be seen by the Vascular specialist to weigh in if any further studies will help evaluate this. Pls refer to Vascular Surgery, dx: concern for subclavian steal. Thanks!

## 2022-12-07 ENCOUNTER — Other Ambulatory Visit: Payer: Self-pay | Admitting: *Deleted

## 2022-12-07 DIAGNOSIS — G458 Other transient cerebral ischemic attacks and related syndromes: Secondary | ICD-10-CM

## 2022-12-14 ENCOUNTER — Encounter: Payer: Self-pay | Admitting: Vascular Surgery

## 2022-12-14 ENCOUNTER — Ambulatory Visit: Payer: Medicare HMO | Admitting: Vascular Surgery

## 2022-12-14 ENCOUNTER — Ambulatory Visit (HOSPITAL_COMMUNITY)
Admission: RE | Admit: 2022-12-14 | Discharge: 2022-12-14 | Disposition: A | Discharge status: Home / Self Care | Payer: Medicare HMO | Source: Ambulatory Visit | Attending: Vascular Surgery | Admitting: Vascular Surgery

## 2022-12-14 VITALS — BP 160/82 | HR 85 | Temp 98.1°F | Resp 20 | Ht 63.0 in | Wt 194.5 lb

## 2022-12-14 DIAGNOSIS — G458 Other transient cerebral ischemic attacks and related syndromes: Secondary | ICD-10-CM

## 2022-12-14 DIAGNOSIS — R42 Dizziness and giddiness: Secondary | ICD-10-CM | POA: Diagnosis not present

## 2022-12-14 NOTE — Progress Notes (Signed)
[facility] [place]  ASSESSMENT / PLAN: 66 y.o. female with multiple neurologic complaints including a abnormal "pressure" sensation in the head, diplopia, dizziness.  Her symptoms may be explained by posterior cerebral circulation issues.  She does not have subclavian steal syndrome.  Her subclavian arteries are both widely patent by duplex, and she has easily palpable radial pulses.  Her right vertebral artery was not visualized on duplex today, and was not seen on MR angiogram.  Her left vertebral artery shows antegrade flow.  Vertebral or basilar occlusions / stenosis may be contributing to her symptoms.  I will obtain a CT angiogram of her head and neck to better study these arteries.  Call her with the results.  She may need referral to neurointerventional radiology or neurosurgery pending the results.  CHIEF COMPLAINT: Multiple neurologic complaints  HISTORY OF PRESENT ILLNESS: Katherine Ryan is a 66 y.o. female referred to clinic for evaluation of possible subclavian steal syndrome.  The patient reports over the past several months she has had a sensation of pressure in the head.  She does not describe headache.  She also has reported diplopia.  She reports easy forgetfulness.  She reports these symptoms develop when she has been sitting for long period of time.  Past Medical History:  Diagnosis Date   Blindness of right eye with low vision in contralateral eye    Diabetes mellitus without complication (HCC)    GERD (gastroesophageal reflux disease)    Hyperlipidemia     Past Surgical History:  Procedure Laterality Date   BELPHAROPTOSIS REPAIR Right 07/23/2019   CARPAL TUNNEL RELEASE Right    CATARACT PEDIATRIC     right    Family History  Problem Relation Age of Onset   Diabetes Mother    Hypertension Mother    Asthma Father    Arthritis Father     Social History   Socioeconomic History   Marital status: Married    Spouse name: Not on file    Number of children: 2   Years of education: Not on file   Highest education level: Not on file  Occupational History   Occupation: Psychologist, educational  Tobacco Use   Smoking status: Former    Packs/day: 1.50    Years: 35.00    Total pack years: 52.50    Types: Cigarettes    Quit date: 10/13/2011    Years since quitting: 11.1   Smokeless tobacco: Never  Vaping Use   Vaping Use: Never used  Substance and Sexual Activity   Alcohol use: No    Alcohol/week: 0.0 standard drinks of alcohol   Drug use: No   Sexual activity: Not on file  Other Topics Concern   Not on file  Social History Narrative   Right handed    Social Determinants of Health   Financial Resource Strain: Not on file  Food Insecurity: Not on file  Transportation Needs: Not on file  Physical Activity: Not on file  Stress: Not on file  Social Connections: Not on file  Intimate Partner Violence: Not on file    Allergies  Allergen Reactions   Asa Arthritis Strength-Antacid [Aspirin Buffered] Anaphylaxis    GI upset   Ace Inhibitors Cough   Atorvastatin Other (See Comments)   Nsaids     Current Outpatient Medications  Medication Sig Dispense Refill   Alirocumab (PRALUENT) 150 MG/ML SOAJ Inject 150 mg into the skin every 14 (fourteen) days. 2 mL 12   Biotin-D POWD by Does  not apply route.     Calcium Carbonate (CALCIUM 500 PO) Take by mouth.     Cholecalciferol (VITAMIN D) 50 MCG (2000 UT) CAPS Take 1 capsule (2,000 Units total) by mouth daily. 90 capsule 1   losartan (COZAAR) 100 MG tablet Take 1 tablet (100 mg total) by mouth daily. 90 tablet 1   Magnesium 200 MG TABS Take by mouth.     metFORMIN (GLUCOPHAGE) 500 MG tablet Take 1 tablet (500 mg total) by mouth daily with breakfast. 90 tablet 1   No current facility-administered medications for this visit.    PHYSICAL EXAM Vitals:   12/14/22 1507 12/14/22 1510  BP: (!) 147/77 (!) 160/82  Pulse: 85   Resp: 20   Temp: 98.1 F (36.7 C)   SpO2: 96%    Weight: 194 lb 8 oz (88.2 kg)   Height: '5\' 3"'$  (1.6 m)    Woman in no acute distress Regular rate and rhythm Unlabored breathing Easily palpable radial pulses bilaterally  PERTINENT LABORATORY AND RADIOLOGIC DATA  Most recent CBC    Latest Ref Rng & Units 10/13/2022    8:41 AM 07/16/2022    8:28 AM 06/04/2022   12:01 PM  CBC  WBC 3.4 - 10.8 x10E3/uL 6.6  5.4  5.6   Hemoglobin 11.1 - 15.9 g/dL 14.2  13.9  13.9   Hematocrit 34.0 - 46.6 % 42.5  42.9  41.2   Platelets 150 - 450 x10E3/uL 264  243  219      Most recent CMP    Latest Ref Rng & Units 10/13/2022    8:41 AM 07/16/2022    8:28 AM 06/04/2022   12:01 PM  CMP  Glucose 70 - 99 mg/dL 128  119  95   BUN 8 - 27 mg/dL '17  14  18   '$ Creatinine 0.57 - 1.00 mg/dL 0.71  0.75  0.66   Sodium 134 - 144 mmol/L 140  140  140   Potassium 3.5 - 5.2 mmol/L 5.0  5.1  4.9   Chloride 96 - 106 mmol/L 100  100  101   CO2 20 - 29 mmol/L '25  24  25   '$ Calcium 8.7 - 10.3 mg/dL 9.6  9.7  9.5   Total Protein 6.0 - 8.5 g/dL 7.7  7.5    Total Bilirubin 0.0 - 1.2 mg/dL 0.3  0.3    Alkaline Phos 44 - 121 IU/L 96  79    AST 0 - 40 IU/L 31  22    ALT 0 - 32 IU/L 49  28      Renal function CrCl cannot be calculated (Patient's most recent lab result is older than the maximum 21 days allowed.).  HB A1C (BAYER DCA - WAIVED) (%)  Date Value  10/13/2022 6.4 (H)    LDL (calc)  Date Value Ref Range Status  04/04/2013 89 <100 mg/dL Final    Comment:    LDL-C is inaccurate if patient is nonfasting.   Reference Range: ---------------- Optimal:            <100 Near/Above Optimal: 100-129 Borderline High:    130-159 High:               160-189 Very High:          >=190       LDLC SERPL CALC-MCNC  Date Value Ref Range Status  08/26/2014 88 0 - 99 mg/dL Final    Comment:  Optimal               <  100                           Above optimal     100 -  129                           Borderline        130 -  159                            High              160 -  189                           Very high             >  189 LDL-C is inaccurate if patient is non-fasting.   LDL Chol Calc (NIH)  Date Value Ref Range Status  10/13/2022 85 0 - 99 mg/dL Final   LDL-C  Date Value Ref Range Status  03/17/2015 75 0 - 99 mg/dL Final    Comment:                              Optimal               <  100                           Above optimal     100 -  129                           Borderline        130 -  159                           High              160 -  189                           Very high             >  189 LDL-C is inaccurate if patient is non-fasting.     Right Carotid: Velocities in the right ICA are consistent with a 1-39%  stenosis.   Left Carotid: Velocities in the left ICA are consistent with a 1-39%  stenosis.               The extracranial vessels were near-normal with only minimal  wall               thickening or plaque.   Vertebrals:  Left vertebral artery demonstrates antegrade flow. Right  vertebral              artery was not visualized.  Subclavians: Normal flow hemodynamics were seen in bilateral subclavian               arteries.    Yevonne Aline. Stanford Breed, MD [facility] Phone Number: [phone] 12/14/2022 3:41 PM   Total time spent  on preparing this encounter including chart review, data review, collecting history, examining the patient, coordinating care for this new patient, 60 minutes.  Portions of this report may have been transcribed using voice recognition software.  Every effort has been made to ensure accuracy; however, inadvertent computerized transcription errors may still be present.

## 2022-12-15 DIAGNOSIS — E119 Type 2 diabetes mellitus without complications: Secondary | ICD-10-CM | POA: Diagnosis not present

## 2022-12-15 DIAGNOSIS — H52222 Regular astigmatism, left eye: Secondary | ICD-10-CM | POA: Diagnosis not present

## 2022-12-15 DIAGNOSIS — H43812 Vitreous degeneration, left eye: Secondary | ICD-10-CM | POA: Diagnosis not present

## 2022-12-15 DIAGNOSIS — H2512 Age-related nuclear cataract, left eye: Secondary | ICD-10-CM | POA: Diagnosis not present

## 2022-12-15 DIAGNOSIS — H5202 Hypermetropia, left eye: Secondary | ICD-10-CM | POA: Diagnosis not present

## 2022-12-15 DIAGNOSIS — H524 Presbyopia: Secondary | ICD-10-CM | POA: Diagnosis not present

## 2022-12-15 LAB — HM DIABETES EYE EXAM

## 2022-12-29 ENCOUNTER — Other Ambulatory Visit: Payer: Self-pay

## 2022-12-29 DIAGNOSIS — G458 Other transient cerebral ischemic attacks and related syndromes: Secondary | ICD-10-CM

## 2023-01-06 ENCOUNTER — Ambulatory Visit (INDEPENDENT_AMBULATORY_CARE_PROVIDER_SITE_OTHER): Payer: Medicare HMO

## 2023-01-06 VITALS — Ht 62.0 in | Wt 195.0 lb

## 2023-01-06 DIAGNOSIS — Z0000 Encounter for general adult medical examination without abnormal findings: Secondary | ICD-10-CM

## 2023-01-06 DIAGNOSIS — Z1211 Encounter for screening for malignant neoplasm of colon: Secondary | ICD-10-CM

## 2023-01-06 NOTE — Progress Notes (Signed)
Subjective:   Katherine Ryan is a 67 y.o. female who presents for an Initial Medicare Annual Wellness Visit. I connected with  Joie Bimler on 01/06/23 by a audio enabled telemedicine application and verified that I am speaking with the correct person using two identifiers.  Patient Location: Home  Provider Location: Home Office  I discussed the limitations of evaluation and management by telemedicine. The patient expressed understanding and agreed to proceed.  Review of Systems     Cardiac Risk Factors include: advanced age (>33mn, >>4women);hypertension;diabetes mellitus     Objective:    Today's Vitals   01/06/23 1031  Weight: 195 lb (88.5 kg)  Height: '5\' 2"'$  (1.575 m)   Body mass index is 35.67 kg/m.     01/06/2023   10:36 AM 10/19/2022    8:45 AM  Advanced Directives  Does Patient Have a Medical Advance Directive? No No  Would patient like information on creating a medical advance directive? No - Patient declined     Current Medications (verified) Outpatient Encounter Medications as of 01/06/2023  Medication Sig   Alirocumab (PRALUENT) 150 MG/ML SOAJ Inject 150 mg into the skin every 14 (fourteen) days.   Biotin-D POWD by Does not apply route.   Calcium Carbonate (CALCIUM 500 PO) Take by mouth.   Cholecalciferol (VITAMIN D) 50 MCG (2000 UT) CAPS Take 1 capsule (2,000 Units total) by mouth daily.   losartan (COZAAR) 100 MG tablet Take 1 tablet (100 mg total) by mouth daily.   Magnesium 200 MG TABS Take by mouth.   metFORMIN (GLUCOPHAGE) 500 MG tablet Take 1 tablet (500 mg total) by mouth daily with breakfast.   No facility-administered encounter medications on file as of 01/06/2023.    Allergies (verified) Asa arthritis strength-antacid [aspirin buffered], Ace inhibitors, Atorvastatin, and Nsaids   History: Past Medical History:  Diagnosis Date   Blindness of right eye with low vision in contralateral eye    Diabetes mellitus without complication (HCC)     GERD (gastroesophageal reflux disease)    Hyperlipidemia    Past Surgical History:  Procedure Laterality Date   BELPHAROPTOSIS REPAIR Right 07/23/2019   CARPAL TUNNEL RELEASE Right    CATARACT PEDIATRIC     right   Family History  Problem Relation Age of Onset   Diabetes Mother    Hypertension Mother    Asthma Father    Arthritis Father    Social History   Socioeconomic History   Marital status: Married    Spouse name: Not on file   Number of children: 2   Years of education: Not on file   Highest education level: Not on file  Occupational History   Occupation: MPsychologist, educational Tobacco Use   Smoking status: Former    Packs/day: 1.50    Years: 35.00    Total pack years: 52.50    Types: Cigarettes    Quit date: 10/13/2011    Years since quitting: 11.2   Smokeless tobacco: Never  Vaping Use   Vaping Use: Never used  Substance and Sexual Activity   Alcohol use: No    Alcohol/week: 0.0 standard drinks of alcohol   Drug use: No   Sexual activity: Not on file  Other Topics Concern   Not on file  Social History Narrative   Right handed    Social Determinants of Health   Financial Resource Strain: Low Risk  (01/06/2023)   Overall Financial Resource Strain (CARDIA)    Difficulty of Paying Living Expenses: Not  hard at all  Food Insecurity: No Food Insecurity (01/06/2023)   Hunger Vital Sign    Worried About Running Out of Food in the Last Year: Never true    Ran Out of Food in the Last Year: Never true  Transportation Needs: No Transportation Needs (01/06/2023)   PRAPARE - Hydrologist (Medical): No    Lack of Transportation (Non-Medical): No  Physical Activity: Insufficiently Active (01/06/2023)   Exercise Vital Sign    Days of Exercise per Week: 3 days    Minutes of Exercise per Session: 30 min  Stress: No Stress Concern Present (01/06/2023)   [place]    Feeling of  Stress : Not at all  Social Connections: [place] (01/06/2023)   Social Connection and Isolation Panel [NHANES]    Frequency of Communication with Friends and Family: More than three times a week    Frequency of Social Gatherings with Friends and Family: More than three times a week    Attends Religious Services: More than 4 times per year    Active Member of [employer] or Organizations: Yes    Attends Music therapist: More than 4 times per year    Marital Status: Married    Tobacco Counseling Counseling given: Not Answered   Clinical Intake:  Pre-visit preparation completed: Yes  Pain : No/denies pain     Nutritional Risks: None Diabetes: Yes CBG done?: No Did pt. bring in CBG monitor from home?: No  How often do you need to have someone help you when you read instructions, pamphlets, or other written materials from your doctor or pharmacy?: 1 - Never  Diabetic?yes Nutrition Risk Assessment:  Has the patient had any N/V/D within the last 2 months?  No  Does the patient have any non-healing wounds?  No  Has the patient had any unintentional weight loss or weight gain?  Yes   Diabetes:  Is the patient diabetic?  Yes  If diabetic, was a CBG obtained today?  No  Did the patient bring in their glucometer from home?  No  How often do you monitor your CBG's? Daily .   Financial Strains and Diabetes Management:  Are you having any financial strains with the device, your supplies or your medication? No .  Does the patient want to be seen by Chronic Care Management for management of their diabetes?  No  Would the patient like to be referred to a Nutritionist or for Diabetic Management?  No   Diabetic Exams:  Diabetic Eye Exam: Completed 11/2022 Diabetic Foot Exam: Overdue, Pt has been advised about the importance in completing this exam. Pt is scheduled for diabetic foot exam on next office visit .   Interpreter Needed?: No  Information entered by ::  Jadene Pierini, LPN   Activities of Daily Living    01/06/2023   10:36 AM  In your present state of health, do you have any difficulty performing the following activities:  Hearing? 0  Vision? 0  Difficulty concentrating or making decisions? 0  Walking or climbing stairs? 0  Dressing or bathing? 0  Doing errands, shopping? 0  Preparing Food and eating ? N  Using the Toilet? N  In the past six months, have you accidently leaked urine? N  Do you have problems with loss of bowel control? N  Managing your Medications? N  Managing your Finances? N  Housekeeping or managing your Housekeeping? N  Patient Care Team: Chevis Pretty, FNP as PCP - General (Nurse Practitioner) Arnoldo Lenis, MD as PCP - Cardiology (Cardiology) Cameron Sprang, MD as Consulting Physician (Neurology)  Indicate any recent Medical Services you may have received from other than [facility] providers in the past year (date may be approximate).     Assessment:   This is a routine wellness examination for Tas[place].  Hearing/Vision screen Vision Screening - Comments:: Wears rx glasses - up to date with routine eye exams with  Dr.johnson   Dietary issues and exercise activities discussed: Current Exercise Habits: Home exercise routine, Type of exercise: walking, Time (Minutes): 30, Frequency (Times/Week): 3, Weekly Exercise (Minutes/Week): 90, Intensity: Mild, Exercise limited by: neurologic condition(s);cardiac condition(s)   Goals Addressed             This Visit's Progress    Exercise 3x per week (30 min per time)         Depression Screen    01/06/2023   10:34 AM 10/14/2022   11:22 AM 07/19/2022   12:00 PM 04/14/2022    2:09 PM 10/16/2021   11:46 AM 07/02/2021    9:14 AM 03/30/2021    8:09 AM  PHQ 2/9 Scores  PHQ - 2 Score 0 0 0 0 1 1 0  PHQ- 9 Score  '4 8 3 11 5     '$ Fall Risk    01/06/2023   10:32 AM 10/19/2022    8:44 AM 10/14/2022   11:22 AM 07/19/2022   12:00 PM 04/14/2022    2:09 PM   Fall Risk   Falls in the past year? 0 0 0 0 0  Number falls in past yr: 0 0     Injury with Fall? 0 0     Risk for fall due to : No Fall Risks      Follow up Falls prevention discussed Falls evaluation completed       FALL RISK PREVENTION PERTAINING TO THE HOME:  Any stairs in or around the home? Yes  If so, are there any without handrails? No  Home free of loose throw rugs in walkways, pet beds, electrical cords, etc? Yes  Adequate lighting in your home to reduce risk of falls? Yes   ASSISTIVE DEVICES UTILIZED TO PREVENT FALLS:  Life alert? No  Use of a cane, walker or w/c? No  Grab bars in the bathroom? Yes  Shower chair or bench in shower? No  Elevated toilet seat or a handicapped toilet? No          01/06/2023   10:36 AM  6CIT Screen  What Year? 0 points  What month? 0 points  What time? 0 points  Count back from 20 0 points  Months in reverse 0 points  Repeat phrase 0 points  Total Score 0 points    Immunizations Immunization History  Administered Date(s) Administered   DTaP 02/14/2008   PFIZER(Purple Top)SARS-COV-2 Vaccination 03/20/2020, 04/12/2020    TDAP status: Due, Education has been provided regarding the importance of this vaccine. Advised may receive this vaccine at local pharmacy or Health Dept. Aware to provide a copy of the vaccination record if obtained from local pharmacy or Health Dept. Verbalized acceptance and understanding.  Flu Vaccine status: Due, Education has been provided regarding the importance of this vaccine. Advised may receive this vaccine at local pharmacy or Health Dept. Aware to provide a copy of the vaccination record if obtained from local pharmacy or Health Dept. Verbalized acceptance and understanding.  Pneumococcal vaccine status: Due, Education has been provided regarding the importance of this vaccine. Advised may receive this vaccine at local pharmacy or Health Dept. Aware to provide a copy of the vaccination record if  obtained from local pharmacy or Health Dept. Verbalized acceptance and understanding.  Covid-19 vaccine status: Completed vaccines  Qualifies for Shingles Vaccine? Yes   Zostavax completed No   Shingrix Completed?: No.    Education has been provided regarding the importance of this vaccine. Patient has been advised to call insurance company to determine out of pocket expense if they have not yet received this vaccine. Advised may also receive vaccine at local pharmacy or Health Dept. Verbalized acceptance and understanding.  Screening Tests Health Maintenance  Topic Date Due   Lung Cancer Screening  Never done   Zoster Vaccines- Shingrix (1 of 2) Never done   DTaP/Tdap/Td (2 - Tdap) 02/13/2018   COVID-19 Vaccine (3 - 2023-24 season) 08/27/2022   OPHTHALMOLOGY EXAM  12/14/2022   INFLUENZA VACCINE  03/27/2023 (Originally 07/27/2022)   Pneumonia Vaccine 75+ Years old (1 - PCV) 04/15/2023 (Originally 09/28/2021)   [place] FOBT  04/15/2023 (Originally 03/22/2018)   COLONOSCOPY (Pts 45-63yr Insurance coverage will need to be confirmed)  10/15/2023 (Originally 09/28/2001)   MAMMOGRAM  01/28/2023   HEMOGLOBIN A1C  04/14/2023   Diabetic kidney evaluation - Urine ACR  07/17/2023   Diabetic kidney evaluation - eGFR measurement  10/14/2023   FOOT EXAM  10/15/2023   Medicare Annual Wellness (AWV)  01/07/2024   DEXA SCAN  Completed   Hepatitis C Screening  Completed   HPV VACCINES  Aged Out    Health Maintenance  Health Maintenance Due  Topic Date Due   Lung Cancer Screening  Never done   Zoster Vaccines- Shingrix (1 of 2) Never done   DTaP/Tdap/Td (2 - Tdap) 02/13/2018   COVID-19 Vaccine (3 - 2023-24 season) 08/27/2022   OPHTHALMOLOGY EXAM  12/14/2022    Colorectal cancer screening: Referral to GI placed 01/06/2023. Pt aware the office will call re: appt.  Mammogram status: Completed 01/28/2022. Repeat every year  Bone Density status: Completed 03/30/2021. Results  reflect: Bone density results: OSTEOPENIA. Repeat every 5 years.  Lung Cancer Screening: (Low Dose CT Chest recommended if Age 67-80years, 30 pack-year currently smoking OR have quit w/in 15years.) does qualify.   Lung Cancer Screening Referral: Declined   Additional Screening:  Hepatitis C Screening: does not qualify; Completed 02/10/2016  Vision Screening: Recommended annual ophthalmology exams for early detection of glaucoma and other disorders of the eye. Is the patient up to date with their annual eye exam?  Yes  Who is the provider or what is the name of the office in which the patient attends annual eye exams? Dr.johnson  If pt is not established with a provider, would they like to be referred to a provider to establish care? No .   Dental Screening: Recommended annual dental exams for proper oral hygiene  Community Resource Referral / Chronic Care Management: CRR required this visit?  No   CCM required this visit?  No      Plan:     I have personally reviewed and noted the following in the patient's chart:   Medical and social history Use of alcohol, tobacco or illicit drugs  Current medications and supplements including opioid prescriptions. Patient is not currently taking opioid prescriptions. Functional ability and status Nutritional status Physical activity Advanced directives List of other physicians Hospitalizations, surgeries, and ER  visits in previous 12 months Vitals Screenings to include cognitive, depression, and falls Referrals and appointments  In addition, I have reviewed and discussed with patient certain preventive protocols, quality metrics, and best practice recommendations. A written personalized care plan for preventive services as well as general preventive health recommendations were provided to patient.     Daphane Shepherd, LPN   [id number]   Nurse Notes: Due All Vaccines

## 2023-01-06 NOTE — Patient Instructions (Signed)
Katherine Ryan , Thank you for taking time to come for your Medicare Wellness Visit. I appreciate your ongoing commitment to your health goals. Please review the following plan we discussed and let me know if I can assist you in the future.   These are the goals we discussed:  Goals      Exercise 3x per week (30 min per time)        This is a list of the screening recommended for you and due dates:  Health Maintenance  Topic Date Due   Screening for Lung Cancer  Never done   Zoster (Shingles) Vaccine (1 of 2) Never done   DTaP/Tdap/Td vaccine (2 - Tdap) 02/13/2018   COVID-19 Vaccine (3 - 2023-24 season) 08/27/2022   Eye exam for diabetics  12/14/2022   Flu Shot  03/27/2023*   Pneumonia Vaccine (1 - PCV) 04/15/2023*   Stool Blood Test  04/15/2023*   Colon Cancer Screening  10/15/2023*   Mammogram  01/28/2023   Hemoglobin A1C  04/14/2023   Yearly kidney health urinalysis for diabetes  07/17/2023   Yearly kidney function blood test for diabetes  10/14/2023   Complete foot exam   10/15/2023   Medicare Annual Wellness Visit  01/07/2024   DEXA scan (bone density measurement)  Completed   Hepatitis C Screening: USPSTF Recommendation to screen - Ages 41-79 yo.  Completed   HPV Vaccine  Aged Out  *Topic was postponed. The date shown is not the original due date.    Advanced directives: Please bring a copy of your health care power of attorney and living will to the office to be added to your chart at your convenience.   Conditions/risks identified: Aim for 30 minutes of exercise or brisk walking, 6-8 glasses of water, and 5 servings of fruits and vegetables each day.   Next appointment: Follow up in one year for your annual wellness visit    Preventive Care 65 Years and Older, Female Preventive care refers to lifestyle choices and visits with your health care provider that can promote health and wellness. What does preventive care include? A yearly physical exam. This is also called  an annual well check. Dental exams once or twice a year. Routine eye exams. Ask your health care provider how often you should have your eyes checked. Personal lifestyle choices, including: Daily care of your teeth and gums. Regular physical activity. Eating a healthy diet. Avoiding tobacco and drug use. Limiting alcohol use. Practicing safe sex. Taking low-dose aspirin every day. Taking vitamin and mineral supplements as recommended by your health care provider. What happens during an annual well check? The services and screenings done by your health care provider during your annual well check will depend on your age, overall health, lifestyle risk factors, and family history of disease. Counseling  Your health care provider may ask you questions about your: Alcohol use. Tobacco use. Drug use. Emotional well-being. Home and relationship well-being. Sexual activity. Eating habits. History of falls. Memory and ability to understand (cognition). Work and work Statistician. Reproductive health. Screening  You may have the following tests or measurements: Height, weight, and BMI. Blood pressure. Lipid and cholesterol levels. These may be checked every 5 years, or more frequently if you are over 65 years old. Skin check. Lung cancer screening. You may have this screening every year starting at age 22 if you have a 30-pack-year history of smoking and currently smoke or have quit within the past 15 years. Fecal occult blood test (  FOBT) of the stool. You may have this test every year starting at age 82. Flexible sigmoidoscopy or colonoscopy. You may have a sigmoidoscopy every 5 years or a colonoscopy every 10 years starting at age 67. Hepatitis C blood test. Hepatitis B blood test. Sexually transmitted disease (STD) testing. Diabetes screening. This is done by checking your blood sugar (glucose) after you have not eaten for a while (fasting). You may have this done every 1-3  years. Bone density scan. This is done to screen for osteoporosis. You may have this done starting at age 66. Mammogram. This may be done every 1-2 years. Talk to your health care provider about how often you should have regular mammograms. Talk with your health care provider about your test results, treatment options, and if necessary, the need for more tests. Vaccines  Your health care provider may recommend certain vaccines, such as: Influenza vaccine. This is recommended every year. Tetanus, diphtheria, and acellular pertussis (Tdap, Td) vaccine. You may need a Td booster every 10 years. Zoster vaccine. You may need this after age 48. Pneumococcal 13-valent conjugate (PCV13) vaccine. One dose is recommended after age 21. Pneumococcal polysaccharide (PPSV23) vaccine. One dose is recommended after age 58. Talk to your health care provider about which screenings and vaccines you need and how often you need them. This information is not intended to replace advice given to you by your health care provider. Make sure you discuss any questions you have with your health care provider. Document Released: 01/09/2016 Document Revised: 09/01/2016 Document Reviewed: 10/14/2015 Elsevier Interactive Patient Education  2017 [place] Prevention in the Home Falls can cause injuries. They can happen to people of all ages. There are many things you can do to make your home safe and to help prevent falls. What can I do on the outside of my home? Regularly fix the edges of walkways and driveways and fix any cracks. Remove anything that might make you trip as you walk through a door, such as a raised step or threshold. Trim any bushes or trees on the path to your home. Use bright outdoor lighting. Clear any walking paths of anything that might make someone trip, such as rocks or tools. Regularly check to see if handrails are loose or broken. Make sure that both sides of any steps have  handrails. Any raised decks and porches should have guardrails on the edges. Have any leaves, snow, or ice cleared regularly. Use sand or salt on walking paths during winter. Clean up any spills in your garage right away. This includes oil or grease spills. What can I do in the bathroom? Use night lights. Install grab bars by the toilet and in the tub and shower. Do not use towel bars as grab bars. Use non-skid mats or decals in the tub or shower. If you need to sit down in the shower, use a plastic, non-slip stool. Keep the floor dry. Clean up any water that spills on the floor as soon as it happens. Remove soap buildup in the tub or shower regularly. Attach bath mats securely with double-sided non-slip rug tape. Do not have throw rugs and other things on the floor that can make you trip. What can I do in the bedroom? Use night lights. Make sure that you have a light by your bed that is easy to reach. Do not use any sheets or blankets that are too big for your bed. They should not hang down onto the floor. Have a  firm chair that has side arms. You can use this for support while you get dressed. Do not have throw rugs and other things on the floor that can make you trip. What can I do in the kitchen? Clean up any spills right away. Avoid walking on wet floors. Keep items that you use a lot in easy-to-reach places. If you need to reach something above you, use a strong step stool that has a grab bar. Keep electrical cords out of the way. Do not use floor polish or wax that makes floors slippery. If you must use wax, use non-skid floor wax. Do not have throw rugs and other things on the floor that can make you trip. What can I do with my stairs? Do not leave any items on the stairs. Make sure that there are handrails on both sides of the stairs and use them. Fix handrails that are broken or loose. Make sure that handrails are as long as the stairways. Check any carpeting to make sure  that it is firmly attached to the stairs. Fix any carpet that is loose or worn. Avoid having throw rugs at the top or bottom of the stairs. If you do have throw rugs, attach them to the floor with carpet tape. Make sure that you have a light switch at the top of the stairs and the bottom of the stairs. If you do not have them, ask someone to add them for you. What else can I do to help prevent falls? Wear shoes that: Do not have high heels. Have rubber bottoms. Are comfortable and fit you well. Are closed at the toe. Do not wear sandals. If you use a stepladder: Make sure that it is fully opened. Do not climb a closed stepladder. Make sure that both sides of the stepladder are locked into place. Ask someone to hold it for you, if possible. Clearly mark and make sure that you can see: Any grab bars or handrails. First and last steps. Where the edge of each step is. Use tools that help you move around (mobility aids) if they are needed. These include: Canes. Walkers. Scooters. Crutches. Turn on the lights when you go into a dark area. Replace any light bulbs as soon as they burn out. Set up your furniture so you have a clear path. Avoid moving your furniture around. If any of your floors are uneven, fix them. If there are any pets around you, be aware of where they are. Review your medicines with your doctor. Some medicines can make you feel dizzy. This can increase your chance of falling. Ask your doctor what other things that you can do to help prevent falls. This information is not intended to replace advice given to you by your health care provider. Make sure you discuss any questions you have with your health care provider. Document Released: 10/09/2009 Document Revised: 05/20/2016 Document Reviewed: 01/17/2015 Elsevier Interactive Patient Education  2017 [employer].

## 2023-01-12 ENCOUNTER — Other Ambulatory Visit (HOSPITAL_COMMUNITY): Payer: Self-pay

## 2023-01-12 ENCOUNTER — Telehealth: Payer: Self-pay

## 2023-01-12 NOTE — Telephone Encounter (Signed)
Pharmacy Patient Advocate Encounter  Prior Authorization for Repatha '140mg'$ /ml has been approved.    key# [id number] Effective dates: 1.1.24 through 12.31.24  Please note that RX praluent is no longer on the patient formulary.

## 2023-01-13 ENCOUNTER — Ambulatory Visit (HOSPITAL_COMMUNITY)
Admission: RE | Admit: 2023-01-13 | Discharge: 2023-01-13 | Disposition: A | Discharge status: Home / Self Care | Payer: Medicare HMO | Source: Ambulatory Visit | Attending: Vascular Surgery | Admitting: Vascular Surgery

## 2023-01-13 ENCOUNTER — Other Ambulatory Visit: Payer: Self-pay

## 2023-01-13 DIAGNOSIS — I6521 Occlusion and stenosis of right carotid artery: Secondary | ICD-10-CM | POA: Diagnosis not present

## 2023-01-13 DIAGNOSIS — E785 Hyperlipidemia, unspecified: Secondary | ICD-10-CM

## 2023-01-13 DIAGNOSIS — G458 Other transient cerebral ischemic attacks and related syndromes: Secondary | ICD-10-CM

## 2023-01-13 DIAGNOSIS — I152 Hypertension secondary to endocrine disorders: Secondary | ICD-10-CM

## 2023-01-13 DIAGNOSIS — E119 Type 2 diabetes mellitus without complications: Secondary | ICD-10-CM

## 2023-01-13 LAB — POCT I-STAT CREATININE: Creatinine, Ser: 0.7 mg/dL (ref 0.44–1.00)

## 2023-01-13 MED ORDER — SODIUM CHLORIDE (PF) 0.9 % IJ SOLN
INTRAMUSCULAR | Status: AC
  Filled 2023-01-13: qty 50

## 2023-01-13 MED ORDER — IOHEXOL 350 MG/ML SOLN
80.0000 mL | Freq: Once | INTRAVENOUS | Status: AC | PRN
  Administered 2023-01-13: 80 mL via INTRAVENOUS

## 2023-01-14 ENCOUNTER — Encounter: Payer: Self-pay | Admitting: Nurse Practitioner

## 2023-01-14 ENCOUNTER — Ambulatory Visit (INDEPENDENT_AMBULATORY_CARE_PROVIDER_SITE_OTHER): Payer: Medicare HMO | Admitting: Nurse Practitioner

## 2023-01-14 ENCOUNTER — Encounter: Payer: Self-pay | Admitting: Pharmacist Clinician (PhC)/ Clinical Pharmacy Specialist

## 2023-01-14 VITALS — BP 148/75 | HR 73 | Temp 97.3°F | Resp 20 | Ht 62.0 in | Wt 199.0 lb

## 2023-01-14 DIAGNOSIS — E119 Type 2 diabetes mellitus without complications: Secondary | ICD-10-CM

## 2023-01-14 DIAGNOSIS — M8588 Other specified disorders of bone density and structure, other site: Secondary | ICD-10-CM

## 2023-01-14 DIAGNOSIS — E559 Vitamin D deficiency, unspecified: Secondary | ICD-10-CM

## 2023-01-14 DIAGNOSIS — E1159 Type 2 diabetes mellitus with other circulatory complications: Secondary | ICD-10-CM

## 2023-01-14 DIAGNOSIS — E785 Hyperlipidemia, unspecified: Secondary | ICD-10-CM | POA: Diagnosis not present

## 2023-01-14 DIAGNOSIS — Z6833 Body mass index (BMI) 33.0-33.9, adult: Secondary | ICD-10-CM

## 2023-01-14 DIAGNOSIS — K219 Gastro-esophageal reflux disease without esophagitis: Secondary | ICD-10-CM | POA: Diagnosis not present

## 2023-01-14 DIAGNOSIS — I152 Hypertension secondary to endocrine disorders: Secondary | ICD-10-CM | POA: Diagnosis not present

## 2023-01-14 LAB — LIPID PANEL

## 2023-01-14 LAB — BAYER DCA HB A1C WAIVED: HB A1C (BAYER DCA - WAIVED): 7 % — ABNORMAL HIGH (ref 4.8–5.6)

## 2023-01-14 MED ORDER — METFORMIN HCL 500 MG PO TABS: 500.0000 mg | ORAL_TABLET | Freq: Every day | ORAL | 1 refills | Status: DC

## 2023-01-14 MED ORDER — REPATHA SURECLICK 140 MG/ML ~~LOC~~ SOAJ: 140.0000 mg | SUBCUTANEOUS | 4 refills | Status: DC

## 2023-01-14 MED ORDER — VITAMIN D 50 MCG (2000 UT) PO CAPS: 1.0000 | ORAL_CAPSULE | Freq: Every day | ORAL | 1 refills | Status: DC

## 2023-01-14 MED ORDER — LOSARTAN POTASSIUM 100 MG PO TABS: 100.0000 mg | ORAL_TABLET | Freq: Every day | ORAL | 1 refills | Status: DC

## 2023-01-14 NOTE — Progress Notes (Signed)
Subjective:    Patient ID: Katherine Ryan, female    DOB: 07-11-1956, 67 y.o.   MRN: [id number]   Chief Complaint: medical management of chronic issues     HPI:  Katherine Ryan is a 67 y.o. who identifies as a female who was assigned female at birth.   Social history: Lives with: husband-  she is his care giver Work history: retired   Scientist, forensic in today for follow up of the following chronic medical issues:  1. Hypertension associated with diabetes ([place]) No c/o chest pain sob or headache. Does  check blood pressure at home. Varies and is different in each arm. Usually in 824-235 systolic when sitting but when standing 361 systolic BP Readings from Last 3 Encounters:  12/14/22 (!) 160/82  10/19/22 (!) 161/83  10/14/22 135/76     2. Hyperlipidemia with target LDL less than 100 Tries to watch diet- loves to try different diets. Does try to do some exercise. Lab Results  Component Value Date   CHOL 186 10/13/2022   HDL 76 10/13/2022   LDLCALC 85 10/13/2022   TRIG 147 10/13/2022   CHOLHDL 2.4 10/13/2022     3. Type 2 diabetes mellitus without complication, without long-term current use of insulin (HCC) Fasting blood sugars are running around 130-150. No low blood sugars Lab Results  Component Value Date   HGBA1C 6.4 (H) 10/13/2022     4. Gastroesophageal reflux disease without esophagitis No recent symptoms  5. Osteopenia of lumbar spine Last dexascan was done on 03/30/21. Her t score was -1.4  6. Vitamin D deficiency Is on daily vitamin d supplement  7. BMI 33.0-33.9,adult Weight is up 4lbs Wt Readings from Last 3 Encounters:  01/14/23 199 lb (90.3 kg)  01/06/23 195 lb (88.5 kg)  12/14/22 194 lb 8 oz (88.2 kg)   BMI Readings from Last 3 Encounters:  01/14/23 36.40 kg/m  01/06/23 35.67 kg/m  12/14/22 34.45 kg/m     New complaints: Having intermittent vertigo- and is only when sitting . She is seeing specialist and is waiting on CT scan  results.  Allergies  Allergen Reactions   Asa Arthritis Strength-Antacid [Aspirin Buffered] Anaphylaxis    GI upset   Ace Inhibitors Cough   Atorvastatin Other (See Comments)   Nsaids    Outpatient Encounter Medications as of 01/14/2023  Medication Sig   Biotin-D POWD by Does not apply route.   Calcium Carbonate (CALCIUM 500 PO) Take by mouth.   Cholecalciferol (VITAMIN D) 50 MCG (2000 UT) CAPS Take 1 capsule (2,000 Units total) by mouth daily.   Evolocumab (REPATHA SURECLICK) 443 MG/ML SOAJ Inject 140 mg into the skin every 14 (fourteen) days.   losartan (COZAAR) 100 MG tablet Take 1 tablet (100 mg total) by mouth daily.   Magnesium 200 MG TABS Take by mouth.   metFORMIN (GLUCOPHAGE) 500 MG tablet Take 1 tablet (500 mg total) by mouth daily with breakfast.   No facility-administered encounter medications on file as of 01/14/2023.    Past Surgical History:  Procedure Laterality Date   BELPHAROPTOSIS REPAIR Right 07/23/2019   CARPAL TUNNEL RELEASE Right    CATARACT PEDIATRIC     right    Family History  Problem Relation Age of Onset   Diabetes Mother    Hypertension Mother    Asthma Father    Arthritis Father       Controlled substance contract: n/a     Review of Systems  Constitutional:  Negative for diaphoresis.  Eyes:  Negative for pain.  Respiratory:  Negative for shortness of breath.   Cardiovascular:  Negative for chest pain, palpitations and leg swelling.  Gastrointestinal:  Negative for abdominal pain.  Endocrine: Negative for polydipsia.  Skin:  Negative for rash.  Neurological:  Positive for dizziness. Negative for weakness and headaches.  Hematological:  Does not bruise/bleed easily.  All other systems reviewed and are negative.      Objective:   Physical Exam Vitals and nursing note reviewed.  Constitutional:      General: She is not in acute distress.    Appearance: Normal appearance. She is well-developed.  HENT:     Head: Normocephalic.      Right Ear: Tympanic membrane normal.     Left Ear: Tympanic membrane normal.     Nose: Nose normal.     Mouth/Throat:     Mouth: Mucous membranes are moist.  Eyes:     Pupils: Pupils are equal, round, and reactive to light.  Neck:     Vascular: No carotid bruit or JVD.  Cardiovascular:     Rate and Rhythm: Normal rate and regular rhythm.     Heart sounds: Normal heart sounds.  Pulmonary:     Effort: Pulmonary effort is normal. No respiratory distress.     Breath sounds: Normal breath sounds. No wheezing or rales.  Chest:     Chest wall: No tenderness.  Abdominal:     General: Bowel sounds are normal. There is no distension or abdominal bruit.     Palpations: Abdomen is soft. There is no hepatomegaly, splenomegaly, mass or pulsatile mass.     Tenderness: There is no abdominal tenderness.  Musculoskeletal:        General: Normal range of motion.     Cervical back: Normal range of motion and neck supple.  Lymphadenopathy:     Cervical: No cervical adenopathy.  Skin:    General: Skin is warm and dry.  Neurological:     General: No focal deficit present.     Mental Status: She is alert and oriented to person, place, and time.     Cranial Nerves: No cranial nerve deficit.     Sensory: No sensory deficit.     Deep Tendon Reflexes: Reflexes are normal and symmetric.  Psychiatric:        Behavior: Behavior normal.        Thought Content: Thought content normal.        Judgment: Judgment normal.     BP (!) 148/75   Pulse 73   Temp (!) 97.3 F (36.3 C) (Temporal)   Resp 20   Ht '5\' 2"'$  (1.575 m)   Wt 199 lb (90.3 kg)   LMP 04/04/2005   SpO2 95%   BMI 36.40 kg/m   HGBA1c 7.0%     Assessment & Plan:   Katherine Ryan comes in today with chief complaint of Medical Management of Chronic Issues   Diagnosis and orders addressed:  1. Hypertension associated with diabetes ([place]) Low sodium diet - CMP14+EGFR - CBC with Differential/Platelet - losartan (COZAAR) 100 MG  tablet; Take 1 tablet (100 mg total) by mouth daily.  Dispense: 90 tablet; Refill: 1  2. Hyperlipidemia with target LDL less than 100 Low fat diet - Lipid panel - Evolocumab (REPATHA SURECLICK) 401 MG/ML SOAJ; Inject 140 mg into the skin every 14 (fourteen) days.  Dispense: 6 mL; Refill: 4  3. Type 2 diabetes mellitus without complication, without long-term current use of insulin (  [place]) Continue to watch carbs in diet - Bayer DCA Hb A1c Waived - metFORMIN (GLUCOPHAGE) 500 MG tablet; Take 1 tablet (500 mg total) by mouth daily with breakfast.  Dispense: 90 tablet; Refill: 1  4. Gastroesophageal reflux disease without esophagitis Avoid spicy foods Do not eat 2 hours prior to bedtime   5. Osteopenia of lumbar spine weight bearing exercises  6. Vitamin D deficiency Continue daily vitamin d supplement - Cholecalciferol (VITAMIN D) 50 MCG (2000 UT) CAPS; Take 1 capsule (2,000 Units total) by mouth daily.  Dispense: 90 capsule; Refill: 1  7. BMI 33.0-33.9,adult Discussed diet and exercise for person with BMI >25 Will recheck weight in 3-6 months    Labs pending Health Maintenance reviewed Diet and exercise encouraged  Follow up plan: 3 months   [name], FNP

## 2023-01-14 NOTE — Patient Instructions (Signed)

## 2023-01-15 LAB — LIPID PANEL
Chol/HDL Ratio: 2.7 ratio (ref 0.0–4.4)
Cholesterol, Total: 185 mg/dL (ref 100–199)
HDL: 68 mg/dL (ref >39)
LDL Chol Calc (NIH): 91 mg/dL (ref 0–99)
Triglycerides: 150 mg/dL — ABNORMAL HIGH (ref 0–149)
VLDL Cholesterol Cal: 26 mg/dL (ref 5–40)

## 2023-01-15 LAB — CBC WITH DIFFERENTIAL/PLATELET
Basophils Absolute: 0 10*3/uL (ref 0.0–0.2)
Basos: 1 %
EOS (ABSOLUTE): 0.2 10*3/uL (ref 0.0–0.4)
Eos: 2 %
Hematocrit: 42.6 % (ref 34.0–46.6)
Hemoglobin: 13.8 g/dL (ref 11.1–15.9)
Immature Grans (Abs): 0 10*3/uL (ref 0.0–0.1)
Immature Granulocytes: 0 %
Lymphocytes Absolute: 1.7 10*3/uL (ref 0.7–3.1)
Lymphs: 26 %
MCH: 29.7 pg (ref 26.6–33.0)
MCHC: 32.4 g/dL (ref 31.5–35.7)
MCV: 92 fL (ref 79–97)
Monocytes Absolute: 0.7 10*3/uL (ref 0.1–0.9)
Monocytes: 11 %
Neutrophils Absolute: 3.9 10*3/uL (ref 1.4–7.0)
Neutrophils: 60 %
Platelets: 239 10*3/uL (ref 150–450)
RBC: 4.64 x10E6/uL (ref 3.77–5.28)
RDW: 13.3 % (ref 11.7–15.4)
WBC: 6.4 10*3/uL (ref 3.4–10.8)

## 2023-01-15 LAB — CMP14+EGFR
ALT: 47 IU/L — ABNORMAL HIGH (ref 0–32)
AST: 33 IU/L (ref 0–40)
Albumin/Globulin Ratio: 1.5 (ref 1.2–2.2)
Albumin: 4.4 g/dL (ref 3.9–4.9)
Alkaline Phosphatase: 90 IU/L (ref 44–121)
BUN/Creatinine Ratio: 15 (ref 12–28)
BUN: 12 mg/dL (ref 8–27)
Bilirubin Total: 0.2 mg/dL (ref 0.0–1.2)
CO2: 23 mmol/L (ref 20–29)
Calcium: 9.3 mg/dL (ref 8.7–10.3)
Chloride: 103 mmol/L (ref 96–106)
Creatinine, Ser: 0.79 mg/dL (ref 0.57–1.00)
Globulin, Total: 2.9 g/dL (ref 1.5–4.5)
Glucose: 134 mg/dL — ABNORMAL HIGH (ref 70–99)
Potassium: 4.9 mmol/L (ref 3.5–5.2)
Sodium: 141 mmol/L (ref 134–144)
Total Protein: 7.3 g/dL (ref 6.0–8.5)
eGFR: 82 mL/min/{1.73_m2} (ref >59)

## 2023-01-18 ENCOUNTER — Ambulatory Visit: Payer: Medicare HMO | Admitting: Vascular Surgery

## 2023-01-25 ENCOUNTER — Ambulatory Visit (INDEPENDENT_AMBULATORY_CARE_PROVIDER_SITE_OTHER): Payer: Medicare HMO | Admitting: Vascular Surgery

## 2023-01-25 DIAGNOSIS — R42 Dizziness and giddiness: Secondary | ICD-10-CM

## 2023-01-25 NOTE — Progress Notes (Signed)
CT angiogram reviewed via telephone with patient. She has an occluded right vertebral segment.  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 I do not see a vascular explanation for her symptoms. I encouraged her to follow up with her neurologist and primary care physician.  Katherine Ryan. Stanford Breed, MD [facility] Phone Number: [phone] 01/25/2023 8:26 PM

## 2023-02-02 ENCOUNTER — Ambulatory Visit: Payer: Medicare HMO | Admitting: Neurology

## 2023-02-02 ENCOUNTER — Encounter: Payer: Self-pay | Admitting: Neurology

## 2023-02-02 VITALS — BP 163/83 | HR 79 | Ht 62.0 in | Wt 199.8 lb

## 2023-02-02 DIAGNOSIS — R519 Headache, unspecified: Secondary | ICD-10-CM | POA: Diagnosis not present

## 2023-02-02 DIAGNOSIS — R42 Dizziness and giddiness: Secondary | ICD-10-CM | POA: Diagnosis not present

## 2023-02-02 MED ORDER — DULOXETINE HCL 30 MG PO CPEP: ORAL_CAPSULE | ORAL | 6 refills | Status: DC

## 2023-02-02 NOTE — Patient Instructions (Addendum)
Good to see you. I wish you all the best.  Start Cymbalta '30mg'$ : take 1 capsule. Give it 2 months, if no change in symptoms, stop medication  2. Referral will be sent to the Dizziness clinic at [facility]  3. If there is improvement with Cymbalta, we can schedule a follow-up visit. Otherwise follow-up as needed.

## 2023-02-02 NOTE — Progress Notes (Signed)
NEUROLOGY FOLLOW UP OFFICE NOTE  Katherine Ryan [id number] 1956-12-18  HISTORY OF PRESENT ILLNESS: I had the pleasure of seeing Katherine Ryan in follow-up in the neurology clinic on 02/02/2023. She is again accompanied by her daughter who helps supplement the history today. The patient was last seen 3 months ago for lightheadedness that would occur with prolonged sitting, improving when she gets up and moves around. Records and images were personally reviewed where available. I personally reviewed brain MRI without contrast done 10/2022 which did not show any acute changes. MRA head and neck noted absent antegrade flow throughout the right vertebral artery. She was kindly evaluated by Vascular surgery and had a CTA head and neck in 12/2022, no vertebrobasilar insufficiency. Proximal subclavian atherosclerosis without significant stenosis, thready and intermittent flow in the nondominant right vertebral artery until C1 vertebra where there is normalized density in size. On Vascular follow-up recently, there was no vascular explanation seen for her symptoms.   She is frustrated with continued symptoms. She continues to note that when she sits for around 45 minutes in the daytime, she starts feeling the lightheaded sensation. She would feel pressure in her head and hard time focusing. Once she stands and moves around, symptoms resolve. However, at night when she cannot be moving around as much, she has more symptoms. She does sleep wall at night. She continues to monitor her BP which has overall been normal, but she notes that when she has been sitting for a long period and feels the symptoms, SBP is in the 150s. She denies any anxiety. As she sits in the office today, she reports hearing a high pitched sound in her ear. No falls.   History On Initial Assessment 10/19/2022: This is a pleasant 67 year old right-handed woman with a history of hypertension, hyperlipidemia, DM, right vision loss, presenting for  evaluation of lightheadedness. She was previously noticing blurry vision but not to the point where she could not keep up with what she was doing. The first time she noticed something amiss was in May, then she started paying attention. She reports that when she is sitting, vision gets blurred and she feels a pressure behind her head. If she continues to sit, she gets even more dizzy, and if it is prolonged (>2 hours), she would not be able to focus on what she needs to, unable to remember what she had to take out if she were cooking. One time she was at a doctor's office and had to read the intake form 2-3 times, which is new for her. She had to go closer to her laptop and saw her eye doctor in December with unremarkable exam. She would feel a pressure first, then this resolves when she gets up and walks around. If she gets up, she would feel the pressure but would not have the cognitive changes, and symptoms improve as she keeps moving. If she is cooking with her head bent down, she does not feel anything, but if she is sitting at a computer she would feel the dizziness. She notices if she sits for prolonged periods of time, her blood pressure increases. When she checks BP in the morning, it is fine. One time they were in the car and halfway they had to stop because she could not find her words. She was not feeling the blurred vision or head pressure that time. She denies any nausea/vomiting, focal numbness/tingling/weakness. No loss of consciousness. She denies any neck pain. She tries to get 7-7.5  hrs of sleep. She notices that if her BP is in the 170s, she has a harder time sleeping, "like brain does not calm down." She lives with her husband with dementia and is his primary caregiver. Her memory has not been as good recently, she manages both their medications without issues. She was evaluated by Cardiology, orthostatics were negative, no cardiac testing indicated.    PAST MEDICAL HISTORY: Past Medical  History:  Diagnosis Date   Blindness of right eye with low vision in contralateral eye    Diabetes mellitus without complication (HCC)    GERD (gastroesophageal reflux disease)    Hyperlipidemia     MEDICATIONS: Current Outpatient Medications on File Prior to Visit  Medication Sig Dispense Refill   Biotin-D POWD by Does not apply route.     Calcium Carbonate (CALCIUM 500 PO) Take by mouth.     Cholecalciferol (VITAMIN D) 50 MCG (2000 UT) CAPS Take 1 capsule (2,000 Units total) by mouth daily. 90 capsule 1   Evolocumab (REPATHA SURECLICK) 381 MG/ML SOAJ Inject 140 mg into the skin every 14 (fourteen) days. 6 mL 4   losartan (COZAAR) 100 MG tablet Take 1 tablet (100 mg total) by mouth daily. 90 tablet 1   Magnesium 200 MG TABS Take by mouth.     metFORMIN (GLUCOPHAGE) 500 MG tablet Take 1 tablet (500 mg total) by mouth daily with breakfast. 90 tablet 1   No current facility-administered medications on file prior to visit.    ALLERGIES: Allergies  Allergen Reactions   Asa Arthritis Strength-Antacid [Aspirin Buffered] Anaphylaxis    GI upset   Ace Inhibitors Cough   Atorvastatin Other (See Comments)   Nsaids     FAMILY HISTORY: Family History  Problem Relation Age of Onset   Diabetes Mother    Hypertension Mother    Asthma Father    Arthritis Father     SOCIAL HISTORY: Social History   Socioeconomic History   Marital status: Married    Spouse name: Not on file   Number of children: 2   Years of education: Not on file   Highest education level: Not on file  Occupational History   Occupation: Psychologist, educational  Tobacco Use   Smoking status: Former    Packs/day: 1.50    Years: 35.00    Total pack years: 52.50    Types: Cigarettes    Quit date: 10/13/2011    Years since quitting: 11.3   Smokeless tobacco: Never  Vaping Use   Vaping Use: Never used  Substance and Sexual Activity   Alcohol use: No    Alcohol/week: 0.0 standard drinks of alcohol   Drug use: No    Sexual activity: Not on file  Other Topics Concern   Not on file  Social History Narrative   Right handed    Social Determinants of Health   Financial Resource Strain: Low Risk  (01/06/2023)   Overall Financial Resource Strain (CARDIA)    Difficulty of Paying Living Expenses: Not hard at all  Food Insecurity: No Food Insecurity (01/06/2023)   Hunger Vital Sign    Worried About Running Out of Food in the Last Year: Never true    Ran Out of Food in the Last Year: Never true  Transportation Needs: No Transportation Needs (01/06/2023)   PRAPARE - Hydrologist (Medical): No    Lack of Transportation (Non-Medical): No  Physical Activity: Insufficiently Active (01/06/2023)   Exercise Vital Sign    Days  of Exercise per Week: 3 days    Minutes of Exercise per Session: 30 min  Stress: No Stress Concern Present (01/06/2023)   [place]    Feeling of Stress : Not at all  Social Connections: [place] (01/06/2023)   Social Connection and Isolation Panel [NHANES]    Frequency of Communication with Friends and Family: More than three times a week    Frequency of Social Gatherings with Friends and Family: More than three times a week    Attends Religious Services: More than 4 times per year    Active Member of [employer] or Organizations: Yes    Attends Music therapist: More than 4 times per year    Marital Status: Married  Human resources officer Violence: Not At Risk (01/06/2023)   Humiliation, Afraid, Rape, and Kick questionnaire    Fear of Current or Ex-Partner: No    Emotionally Abused: No    Physically Abused: No    Sexually Abused: No     PHYSICAL EXAM: Vitals:   02/02/23 1253  BP: (!) 163/83  Pulse: 79  SpO2: 96%   General: No acute distress Head:  Normocephalic/atraumatic Skin/Extremities: No rash, no edema Neurological Exam: alert and awake. No aphasia or dysarthria. Fund  of knowledge is appropriate. Attention and concentration are normal.   Cranial nerves: Prosthetic right eye. Left pupil round. Extraocular movements intact with no nystagmus. Visual fields full on left eye.  No facial asymmetry.  Motor: Bulk and tone normal, muscle strength 5/5 throughout with no pronator drift.   Finger to nose testing intact.  Gait narrow-based and steady, able to tandem walk adequately.  Romberg negative.   IMPRESSION: This is a pleasant 67 yo RH woman with a history of hypertension, hyperlipidemia, DM, right vision loss, with lightheadedness. Symptoms worsen if she has been sitting for prolonged periods and improve once she moves around. If she sits for prolonged periods, she gets lightheaded, blurred vision, head pressure, and have difficulty focusing. MRI brain normal. There was initial concern for subclavian steal, however Vascular surgery has evaluated patient and had CTA head and neck done showing no vertebrobasilar insufficiency, proximal subclavian atherosclerosis without significant stenosis, thready and intermittent flow in the nondominant right vertebral artery until C1 vertebra where there is normalized density in size. Per notes, there was no vascular explanation seen for her symptoms. I discussed with her that I am not finding a clear neurological cause as well. We can do a therapeutic trial with headache preventative medication and monitor if this helps with symptoms. She denies any anxiety. She is agreeable to starting Cymbalta '30mg'$  qhs and monitoring symptoms for 2 months. If no improvement, stop Cymbalta. She is agreeable to a second opinion from the [place] clinic at [facility]. Follow-up as needed, call for any changes.     Thank you for allowing me to participate in her care.  Please do not hesitate to call for any questions or concerns.    Ellouise Newer, M.D.   CC: [name], FNP

## 2023-02-21 ENCOUNTER — Telehealth: Payer: Self-pay

## 2023-02-21 DIAGNOSIS — R42 Dizziness and giddiness: Secondary | ICD-10-CM

## 2023-02-21 NOTE — Telephone Encounter (Signed)
Patient is calling in stating that she hasn't received a call from [facility] to schedule an appointment, didn't see a referral. Please advise.

## 2023-02-21 NOTE — Telephone Encounter (Signed)
Referral sent to the dizzy clinic

## 2023-03-23 ENCOUNTER — Encounter: Payer: Self-pay | Admitting: Cardiology

## 2023-03-23 ENCOUNTER — Ambulatory Visit: Payer: Medicare HMO | Attending: Cardiology | Admitting: Cardiology

## 2023-03-23 VITALS — HR 89 | Ht 62.0 in | Wt 203.0 lb

## 2023-03-23 DIAGNOSIS — E785 Hyperlipidemia, unspecified: Secondary | ICD-10-CM | POA: Diagnosis not present

## 2023-03-23 DIAGNOSIS — R42 Dizziness and giddiness: Secondary | ICD-10-CM

## 2023-03-23 DIAGNOSIS — I10 Essential (primary) hypertension: Secondary | ICD-10-CM

## 2023-03-23 MED ORDER — AMLODIPINE BESYLATE 5 MG PO TABS: 5.0000 mg | ORAL_TABLET | Freq: Every day | ORAL | 3 refills | Status: DC

## 2023-03-23 NOTE — Patient Instructions (Signed)
Medication Instructions:  Your physician has recommended you make the following change in your medication:   -Start Norvasc 5 mg tablets once daily  *If you need a refill on your cardiac medications before your next appointment, please call your pharmacy*   Lab Work: None If you have labs (blood work) drawn today and your tests are completely normal, you will receive your results only by: [place] (if you have MyChart) OR A paper copy in the mail If you have any lab test that is abnormal or we need to change your treatment, we will call you to review the results.   Testing/Procedures: None   Follow-Up: At [facility], you and your health needs are our priority.  As part of our continuing mission to provide you with exceptional heart care, we have created designated Provider Care Teams.  These Care Teams include your primary Cardiologist (physician) and Advanced Practice Providers (APPs -  Physician Assistants and Nurse Practitioners) who all work together to provide you with the care you need, when you need it.  We recommend signing up for the patient portal called "MyChart".  Sign up information is provided on this After Visit Summary.  MyChart is used to connect with patients for Virtual Visits (Telemedicine).  Patients are able to view lab/test results, encounter notes, upcoming appointments, etc.  Non-urgent messages can be sent to your provider as well.   To learn more about what you can do with MyChart, go to [email or web address].    Your next appointment:   6 month(s)  Provider:   Carlyle Dolly, MD    Other Instructions Please update our office with blood pressures in 2 weeks

## 2023-03-23 NOTE — Progress Notes (Signed)
Clinical Summary Katherine Ryan is a 67 y.o.female  1.Lightheadedness  - symptoms started in October - episodes tend to occur in the AM.  - about 1.5-2 hrs after waking up   -lightheaded feeling comes while sitting. During episodes vision get blurry. Mild heaviness in back of head. Gets better with getting up and becoming active in the house. Often comes on with playing games on phone    -extensive evaluation by vascular, neuro. No clear etiology - some verterbal disease on CTA but from notes not thought to be etiology - was to be evaluted at [facility]    2. Elevated bp - wt up 16 lbs since last year - she is on losartan 100mg  daily   2. Hyperlipidemia - side effects on lipitor, zetia, now on crestor 5mg  once a week.   - last visit referred to lipid clinic - Jan 2024 TC 185 TG 150 HDL 68 LDL 91  Past Medical History:  Diagnosis Date   Blindness of right eye with low vision in contralateral eye    Diabetes mellitus without complication (HCC)    GERD (gastroesophageal reflux disease)    Hyperlipidemia      Allergies  Allergen Reactions   Asa Arthritis Strength-Antacid [Aspirin Buffered] Anaphylaxis    GI upset   Ace Inhibitors Cough   Atorvastatin Other (See Comments)   Nsaids      Current Outpatient Medications  Medication Sig Dispense Refill   Biotin-D POWD by Does not apply route.     Calcium Carbonate (CALCIUM 500 PO) Take by mouth.     Cholecalciferol (VITAMIN D) 50 MCG (2000 UT) CAPS Take 1 capsule (2,000 Units total) by mouth daily. 90 capsule 1   DULoxetine (CYMBALTA) 30 MG capsule Take 1 capsule every night 30 capsule 6   Evolocumab (REPATHA SURECLICK) [id number] MG/ML SOAJ Inject 140 mg into the skin every 14 (fourteen) days. 6 mL 4   losartan (COZAAR) 100 MG tablet Take 1 tablet (100 mg total) by mouth daily. 90 tablet 1   Magnesium 200 MG TABS Take by mouth.     metFORMIN (GLUCOPHAGE) 500 MG tablet Take 1 tablet (500 mg total) by mouth daily with  breakfast. 90 tablet 1   No current facility-administered medications for this visit.     Past Surgical History:  Procedure Laterality Date   BELPHAROPTOSIS REPAIR Right 07/23/2019   CARPAL TUNNEL RELEASE Right    CATARACT PEDIATRIC     right     Allergies  Allergen Reactions   Asa Arthritis Strength-Antacid [Aspirin Buffered] Anaphylaxis    GI upset   Ace Inhibitors Cough   Atorvastatin Other (See Comments)   Nsaids       Family History  Problem Relation Age of Onset   Diabetes Mother    Hypertension Mother    Asthma Father    Arthritis Father      Social History Katherine Ryan reports that she quit smoking about 11 years ago. Her smoking use included cigarettes. She has a 52.50 pack-year smoking history. She has never used smokeless tobacco. Katherine Ryan reports no history of alcohol use.   Review of Systems CONSTITUTIONAL: No weight loss, fever, chills, weakness or fatigue.  HEENT: Eyes: No visual loss, blurred vision, double vision or yellow sclerae.No hearing loss, sneezing, congestion, runny nose or sore throat.  SKIN: No rash or itching.  CARDIOVASCULAR: per hpi RESPIRATORY: No shortness of breath, cough or sputum.  GASTROINTESTINAL: No anorexia, nausea, vomiting or diarrhea.  No abdominal pain or blood.  GENITOURINARY: No burning on urination, no polyuria NEUROLOGICAL: No headache, dizziness, syncope, paralysis, ataxia, numbness or tingling in the extremities. No change in bowel or bladder control.  MUSCULOSKELETAL: No muscle, back pain, joint pain or stiffness.  LYMPHATICS: No enlarged nodes. No history of splenectomy.  PSYCHIATRIC: No history of depression or anxiety.  ENDOCRINOLOGIC: No reports of sweating, cold or heat intolerance. No polyuria or polydipsia.  Marland Kitchen   Physical Examination Today's Vitals   03/23/23 1359  Pulse: 89  SpO2: 95%  Weight: 203 lb (92.1 kg)  Height: 5\' 2"  (1.575 m)  PainSc: 0-No pain   Body mass index is 37.13  kg/m. 144/76  Gen: resting comfortably, no acute distress HEENT: no scleral icterus, pupils equal round and reactive, no palptable cervical adenopathy,  CV: RRR, no m/r,g no jvd Resp: Clear to auscultation bilaterally GI: abdomen is soft, non-tender, non-distended, normal bowel sounds, no hepatosplenomegaly MSK: extremities are warm, no edema.  Skin: warm, no rash Neuro:  no focal deficits Psych: appropriate affect      Assessment and Plan   1.Lightheadedness - unclear etiology, symptoms are not cardiac in description. Lightheadness and blurry vision that comes on with sitting, improves with getting up and walking.  - extensive evaluation by neuro and vascular, awaiting referral to [facility]    2. Hyperlipidemia - difficulty tolerating statins, LDL above goal in diabetic patient - doing well on repatha, continue current meds  3. HTN - bp above goal, likely uptrend due to weight gain from last year - start norvacs 5mg  daily, update [place] on bp's in 2 weeks  F/u 6 months     Arnoldo Lenis, M.D.

## 2023-03-31 ENCOUNTER — Telehealth: Payer: Self-pay | Admitting: Neurology

## 2023-03-31 NOTE — Telephone Encounter (Signed)
Pt's daughter called in stating the [facility] clinic told her they are not getting the referral. They won't schedule anything without it. She is requesting it to be sent again or call to double check on what might be the problem.

## 2023-03-31 NOTE — Telephone Encounter (Signed)
Referral re faxed.

## 2023-04-04 ENCOUNTER — Encounter: Payer: Self-pay | Admitting: Nurse Practitioner

## 2023-04-04 ENCOUNTER — Ambulatory Visit (INDEPENDENT_AMBULATORY_CARE_PROVIDER_SITE_OTHER): Payer: Medicare HMO | Admitting: Nurse Practitioner

## 2023-04-04 VITALS — BP 132/71 | HR 67 | Temp 97.6°F | Resp 20 | Ht 62.0 in | Wt 202.0 lb

## 2023-04-04 DIAGNOSIS — Z6836 Body mass index (BMI) 36.0-36.9, adult: Secondary | ICD-10-CM

## 2023-04-04 DIAGNOSIS — M8588 Other specified disorders of bone density and structure, other site: Secondary | ICD-10-CM

## 2023-04-04 DIAGNOSIS — I152 Hypertension secondary to endocrine disorders: Secondary | ICD-10-CM

## 2023-04-04 DIAGNOSIS — E119 Type 2 diabetes mellitus without complications: Secondary | ICD-10-CM

## 2023-04-04 DIAGNOSIS — E1169 Type 2 diabetes mellitus with other specified complication: Secondary | ICD-10-CM | POA: Diagnosis not present

## 2023-04-04 DIAGNOSIS — E559 Vitamin D deficiency, unspecified: Secondary | ICD-10-CM

## 2023-04-04 DIAGNOSIS — K219 Gastro-esophageal reflux disease without esophagitis: Secondary | ICD-10-CM

## 2023-04-04 DIAGNOSIS — E1159 Type 2 diabetes mellitus with other circulatory complications: Secondary | ICD-10-CM | POA: Diagnosis not present

## 2023-04-04 DIAGNOSIS — E785 Hyperlipidemia, unspecified: Secondary | ICD-10-CM | POA: Diagnosis not present

## 2023-04-04 LAB — BAYER DCA HB A1C WAIVED: HB A1C (BAYER DCA - WAIVED): 7.2 % — ABNORMAL HIGH (ref 4.8–5.6)

## 2023-04-04 LAB — LIPID PANEL

## 2023-04-04 NOTE — Patient Instructions (Signed)
Duloxetine Delayed-Release Capsules What is this medication? DULOXETINE (doo LOX e teen) treats depression, anxiety, fibromyalgia, and certain types of chronic pain such as nerve, bone, or joint pain. It increases the amount of serotonin and norepinephrine in the brain, hormones that help regulate mood and pain. It belongs to a group of medications called SNRIs. This medicine may be used for other purposes; ask your health care provider or pharmacist if you have questions. COMMON BRAND NAME(S): Cymbalta, Drizalma, Irenka What should I tell my care team before I take this medication? They need to know if you have any of these conditions: Bipolar disorder Glaucoma High blood pressure Kidney disease Liver disease Seizures Suicidal thoughts, plans or attempt; a previous suicide attempt by you or a family member Take medications that treat or prevent blood clots Taken medications called MAOIs like Carbex, Eldepryl, Marplan, Nardil, and Parnate within 14 days Trouble passing urine An unusual reaction to duloxetine, other medications, foods, dyes, or preservatives Pregnant or trying to get pregnant Breast-feeding How should I use this medication? Take this medication by mouth with a glass of water. Follow the directions on the prescription label. Do not crush, cut or chew some capsules of this medication. Some capsules may be opened and sprinkled on applesauce. Check with your care team or pharmacist if you are not sure. You can take this medication with or without food. Take your medication at regular intervals. Do not take your medication more often than directed. Do not stop taking this medication suddenly except upon the advice of your care team. Stopping this medication too quickly may cause serious side effects or your condition may worsen. A special MedGuide will be given to you by the pharmacist with each prescription and refill. Be sure to read this information carefully each time. Talk to  your care team regarding the use of this medication in children. While this medication may be prescribed for children as young as 7 years of age for selected conditions, precautions do apply. Overdosage: If you think you have taken too much of this medicine contact a poison control center or emergency room at once. NOTE: This medicine is only for you. Do not share this medicine with others. What if I miss a dose? If you miss a dose, take it as soon as you can. If it is almost time for your next dose, take only that dose. Do not take double or extra doses. What may interact with this medication? Do not take this medication with any of the following: Desvenlafaxine Levomilnacipran Linezolid MAOIs like Carbex, Eldepryl, Emsam, Marplan, Nardil, and Parnate Methylene blue (injected into a vein) Milnacipran Safinamide Thioridazine Venlafaxine Viloxazine This medication may also interact with the following: Alcohol Amphetamines Aspirin and aspirin-like medications Certain antibiotics like ciprofloxacin and enoxacin Certain medications for blood pressure, heart disease, irregular heart beat Certain medications for depression, anxiety, or psychotic disturbances Certain medications for migraine headache like almotriptan, eletriptan, frovatriptan, naratriptan, rizatriptan, sumatriptan, zolmitriptan Certain medications that treat or prevent blood clots like warfarin, enoxaparin, and dalteparin Cimetidine Fentanyl Lithium NSAIDS, medications for pain and inflammation, like ibuprofen or naproxen Phentermine Procarbazine Rasagiline Sibutramine St. John's wort Theophylline Tramadol Tryptophan This list may not describe all possible interactions. Give your health care provider a list of all the medicines, herbs, non-prescription drugs, or dietary supplements you use. Also tell them if you smoke, drink alcohol, or use illegal drugs. Some items may interact with your medicine. What should I watch  for while using this medication? Tell your   care team if your symptoms do not get better or if they get worse. Visit your care team for regular checks on your progress. Because it may take several weeks to see the full effects of this medication, it is important to continue your treatment as prescribed by your care team. This medication may cause serious skin reactions. They can happen weeks to months after starting the medication. Contact your care team right away if you notice fevers or flu-like symptoms with a rash. The rash may be red or purple and then turn into blisters or peeling of the skin. Or, you might notice a red rash with swelling of the face, lips, or lymph nodes in your neck or under your arms. Watch for new or worsening thoughts of suicide or depression. This includes sudden changes in mood, behaviors, or thoughts. These changes can happen at any time but are more common in the beginning of treatment or after a change in dose. Call your care team right away if you experience these thoughts or worsening depression. Manic episodes may happen in patients with bipolar disorder who take this medication. Watch for changes in feelings or behaviors such as feeling anxious, nervous, agitated, panicky, irritable, hostile, aggressive, impulsive, severely restless, overly excited and hyperactive, or trouble sleeping. These symptoms can happen at any time, but are more common in the beginning of treatment or after a change in dose. Call your care team right away if you notice any of these symptoms. You may get drowsy or dizzy. Do not drive, use machinery, or do anything that needs mental alertness until you know how this medication affects you. Do not stand or sit up quickly, especially if you are an older patient. This reduces the risk of dizzy or fainting spells. Alcohol may interfere with the effect of this medication. Avoid alcoholic drinks. This medication may increase blood sugar. The risk may be  higher in patients who already have diabetes. Ask your care team what you can do to lower your risk of diabetes while taking this medication. This medication can cause an increase in blood pressure. This medication can also cause a sudden drop in your blood pressure, which may make you feel faint and increase the chance of a fall. These effects are most common when you first start the medication or when the dose is increased, or during use of other medications that can cause a sudden drop in blood pressure. Check with your care team for instructions on monitoring your blood pressure while taking this medication. Your mouth may get dry. Chewing sugarless gum or sucking hard candy, and drinking plenty of water, may help. Contact your care team if the problem does not go away or is severe. What side effects may I notice from receiving this medication? Side effects that you should report to your care team as soon as possible: Allergic reactions--skin rash, itching, hives, swelling of the face, lips, tongue, or throat Bleeding--bloody or black, tar-like stools, red or dark brown urine, vomiting blood or brown material that looks like coffee grounds, small, red or purple spots on skin, unusual bleeding or bruising Increase in blood pressure Liver injury--right upper belly pain, loss of appetite, nausea, light-colored stool, dark yellow or brown urine, yellowing skin or eyes, unusual weakness or fatigue Low sodium level--muscle weakness, fatigue, dizziness, headache, confusion Redness, blistering, peeling, or loosening of the skin, including inside the mouth Serotonin syndrome--irritability, confusion, fast or irregular heartbeat, muscle stiffness, twitching muscles, sweating, high fever, seizures, chills, vomiting, diarrhea   Sudden eye pain or change in vision such as blurry vision, seeing halos around lights, vision loss Thoughts of suicide or self-harm, worsening mood, feelings of depression Trouble passing  urine Side effects that usually do not require medical attention (report to your care team if they continue or are bothersome): Change in sex drive or performance Constipation Diarrhea Dizziness Dry mouth Excessive sweating Loss of appetite Nausea Vomiting This list may not describe all possible side effects. Call your doctor for medical advice about side effects. You may report side effects to FDA at 1-800-FDA-1088. Where should I keep my medication? Keep out of the reach of children and pets. Store at room temperature between 15 and 30 degrees C (59 to 86 degrees F). Get rid of any unused medication after the expiration date. To get rid of medications that are no longer needed or have expired: Take the medication to a medication take-back program. Check with your pharmacy or law enforcement to find a location. If you cannot return the medication, check the label or package insert to see if the medication should be thrown out in the garbage or flushed down the toilet. If you are not sure, ask your care team. If it is safe to put it in the trash, take the medication out of the container. Mix the medication with cat litter, dirt, coffee grounds, or other unwanted substance. Seal the mixture in a bag or container. Put it in the trash. NOTE: This sheet is a summary. It may not cover all possible information. If you have questions about this medicine, talk to your doctor, pharmacist, or health care provider.  2023 Elsevier/Gold Standard (2020-12-01 00:00:00)  

## 2023-04-04 NOTE — Progress Notes (Signed)
Subjective:    Patient ID: Katherine Ryan, female    DOB: [birth year], 67 y.o.   MRN: [id number]   Chief Complaint: medical management of chronic issues     HPI:  Katherine Ryan is a 67 y.o. who identifies as a female who was assigned female at birth.   Social history: Lives with: husband Work history: retired   Water engineer in today for follow up of the following chronic medical issues:  1. Hypertension associated with diabetes No c/o chest pain, sob or headache. Blood pressure at home runs around 130 systolic after taking blood pressure meds. BP Readings from Last 3 Encounters:  02/02/23 (!) 163/83  01/14/23 (!) 148/75  12/14/22 (!) 160/82     2. Hyperlipidemia associated with type 2 diabetes mellitus Has not been watching diet very closely lately. No exercise Lab Results  Component Value Date   CHOL 185 01/14/2023   HDL 68 01/14/2023   LDLCALC 91 01/14/2023   TRIG 150 (H) 01/14/2023   CHOLHDL 2.7 01/14/2023   The 10-year ASCVD risk score (Arnett DK, et al., 2019) is: 14.6%   3. Type 2 diabetes mellitus without complication, without long-term current use of insulin She has not been checking her blood sugars.  4. Gastroesophageal reflux disease without esophagitis Currently doing well.  5. Osteopenia of lumbar spine Last dexascan was done on 03/30/21. Er t score was -1.4.  6. Vitamin D deficiency Is on daily vitamin d supplement  7. BMI 36.0-36.9,adult No recent weight changes Wt Readings from Last 3 Encounters:  04/04/23 202 lb (91.6 kg)  03/23/23 203 lb (92.1 kg)  02/02/23 199 lb 12.8 oz (90.6 kg)   BMI Readings from Last 3 Encounters:  04/04/23 36.95 kg/m  03/23/23 37.13 kg/m  02/02/23 36.54 kg/m     New complaints: She saw neurology for her had pressure. They gave her cymbalta. She says she is some better but she stays sleepy all the time. She has been referred to another specialist.  Allergies  Allergen Reactions   Asa Arthritis  Strength-Antacid [Aspirin Buffered] Anaphylaxis    GI upset   Ace Inhibitors Cough   Atorvastatin Other (See Comments)   Lisinopril Cough   Nsaids    Outpatient Encounter Medications as of 04/04/2023  Medication Sig   amLODipine (NORVASC) 5 MG tablet Take 1 tablet (5 mg total) by mouth daily.   Biotin-D POWD by Does not apply route. (Patient not taking: Reported on 03/23/2023)   Calcium Carbonate (CALCIUM 500 PO) Take 1 tablet by mouth daily in the afternoon.   Capsicum, Cayenne, (CAYENNE PO) Take 1 capsule by mouth daily in the afternoon.   Cholecalciferol (VITAMIN D) 50 MCG (2000 UT) CAPS Take 1 capsule (2,000 Units total) by mouth daily.   DULoxetine (CYMBALTA) 30 MG capsule Take 1 capsule every night   Emollient (COLLAGEN EX) Take 2 Scoops by mouth daily.   Evolocumab (REPATHA SURECLICK) 140 MG/ML SOAJ Inject 140 mg into the skin every 14 (fourteen) days.   losartan (COZAAR) 100 MG tablet Take 1 tablet (100 mg total) by mouth daily.   Magnesium 200 MG TABS Take by mouth. (Patient not taking: Reported on 03/23/2023)   Magnesium 250 MG TABS Take 1 tablet by mouth daily in the afternoon.   metFORMIN (GLUCOPHAGE) 500 MG tablet Take 1 tablet (500 mg total) by mouth daily with breakfast.   No facility-administered encounter medications on file as of 04/04/2023.    Past Surgical History:  Procedure Laterality Date   BELPHAROPTOSIS  REPAIR Right 07/23/2019   CARPAL TUNNEL RELEASE Right    CATARACT PEDIATRIC     right    Family History  Problem Relation Age of Onset   Diabetes Mother    Hypertension Mother    Asthma Father    Arthritis Father       Controlled substance contract: n/a     Review of Systems  Constitutional:  Negative for diaphoresis.  Eyes:  Negative for pain.  Respiratory:  Negative for shortness of breath.   Cardiovascular:  Negative for chest pain, palpitations and leg swelling.  Gastrointestinal:  Negative for abdominal pain.  Endocrine: Negative for  polydipsia.  Skin:  Negative for rash.  Neurological:  Negative for dizziness, weakness and headaches.  Hematological:  Does not bruise/bleed easily.  All other systems reviewed and are negative.      Objective:   Physical Exam Vitals and nursing note reviewed.  Constitutional:      General: She is not in acute distress.    Appearance: Normal appearance. She is well-developed.  HENT:     Head: Normocephalic.     Right Ear: Tympanic membrane normal.     Left Ear: Tympanic membrane normal.     Nose: Nose normal.     Mouth/Throat:     Mouth: Mucous membranes are moist.  Eyes:     Pupils: Pupils are equal, round, and reactive to light.  Neck:     Vascular: No carotid bruit or JVD.  Cardiovascular:     Rate and Rhythm: Normal rate and regular rhythm.     Heart sounds: Normal heart sounds.  Pulmonary:     Effort: Pulmonary effort is normal. No respiratory distress.     Breath sounds: Normal breath sounds. No wheezing or rales.  Chest:     Chest wall: No tenderness.  Abdominal:     General: Bowel sounds are normal. There is no distension or abdominal bruit.     Palpations: Abdomen is soft. There is no hepatomegaly, splenomegaly, mass or pulsatile mass.     Tenderness: There is no abdominal tenderness.  Musculoskeletal:        General: Normal range of motion.     Cervical back: Normal range of motion and neck supple.  Lymphadenopathy:     Cervical: No cervical adenopathy.  Skin:    General: Skin is warm and dry.  Neurological:     Mental Status: She is alert and oriented to person, place, and time.     Deep Tendon Reflexes: Reflexes are normal and symmetric.  Psychiatric:        Behavior: Behavior normal.        Thought Content: Thought content normal.        Judgment: Judgment normal.    BP 132/71   Pulse 67   Temp 97.6 F (36.4 C) (Temporal)   Resp 20   Ht 5\' 2"  (1.575 m)   Wt 202 lb (91.6 kg)   LMP 04/04/2005   SpO2 96%   BMI 36.95 kg/m    HGBA1c 7.2%      Assessment & Plan:   Xiomara Straka comes in today with chief complaint of No chief complaint on file.   Diagnosis and orders addressed:  1. Hypertension associated with diabetes Low sodium diet - CBC with Differential/Platelet - CMP14+EGFR  2. Hyperlipidemia associated with type 2 diabetes mellitus Low fat diet - Lipid panel  3. Type 2 diabetes mellitus without complication, without long-term current use of insulin Stricter carb coutning -  Bayer DCA Hb A1c Waived  4. Gastroesophageal reflux disease without esophagitis Avoid spicy foods Do not eat 2 hours prior to bedtime   5. Osteopenia of lumbar spine Weight bearing exercises   6. Vitamin D deficiency cotinue vitamin d supplement  7. BMI 36.0-36.9,adult Discussed diet and exercise for person with BMI >25 Will recheck weight in 3-6 months    Labs pending Health Maintenance reviewed Diet and exercise encouraged  Follow up plan: 3 months   Mary-Margaret Daphine DeutscherMartin, FNP

## 2023-04-05 DIAGNOSIS — B351 Tinea unguium: Secondary | ICD-10-CM | POA: Diagnosis not present

## 2023-04-05 DIAGNOSIS — E1142 Type 2 diabetes mellitus with diabetic polyneuropathy: Secondary | ICD-10-CM | POA: Diagnosis not present

## 2023-04-05 DIAGNOSIS — L84 Corns and callosities: Secondary | ICD-10-CM | POA: Diagnosis not present

## 2023-04-05 DIAGNOSIS — M79676 Pain in unspecified toe(s): Secondary | ICD-10-CM | POA: Diagnosis not present

## 2023-04-05 LAB — CMP14+EGFR
ALT: 54 IU/L — ABNORMAL HIGH (ref 0–32)
AST: 35 IU/L (ref 0–40)
Albumin/Globulin Ratio: 1.5 (ref 1.2–2.2)
Albumin: 4.3 g/dL (ref 3.9–4.9)
Alkaline Phosphatase: 89 IU/L (ref 44–121)
BUN/Creatinine Ratio: 22 (ref 12–28)
BUN: 16 mg/dL (ref 8–27)
Bilirubin Total: 0.2 mg/dL (ref 0.0–1.2)
CO2: 23 mmol/L (ref 20–29)
Calcium: 9.5 mg/dL (ref 8.7–10.3)
Chloride: 97 mmol/L (ref 96–106)
Creatinine, Ser: 0.72 mg/dL (ref 0.57–1.00)
Globulin, Total: 2.9 g/dL (ref 1.5–4.5)
Glucose: 142 mg/dL — ABNORMAL HIGH (ref 70–99)
Potassium: 4.7 mmol/L (ref 3.5–5.2)
Sodium: 135 mmol/L (ref 134–144)
Total Protein: 7.2 g/dL (ref 6.0–8.5)
eGFR: 92 mL/min/{1.73_m2} (ref >59)

## 2023-04-05 LAB — CBC WITH DIFFERENTIAL/PLATELET
Basophils Absolute: 0 10*3/uL (ref 0.0–0.2)
Basos: 1 %
EOS (ABSOLUTE): 0.1 10*3/uL (ref 0.0–0.4)
Eos: 2 %
Hematocrit: 41.4 % (ref 34.0–46.6)
Hemoglobin: 14 g/dL (ref 11.1–15.9)
Immature Grans (Abs): 0 10*3/uL (ref 0.0–0.1)
Immature Granulocytes: 0 %
Lymphocytes Absolute: 1.5 10*3/uL (ref 0.7–3.1)
Lymphs: 23 %
MCH: 29.8 pg (ref 26.6–33.0)
MCHC: 33.8 g/dL (ref 31.5–35.7)
MCV: 88 fL (ref 79–97)
Monocytes Absolute: 0.7 10*3/uL (ref 0.1–0.9)
Monocytes: 11 %
Neutrophils Absolute: 4.1 10*3/uL (ref 1.4–7.0)
Neutrophils: 63 %
Platelets: 247 10*3/uL (ref 150–450)
RBC: 4.7 x10E6/uL (ref 3.77–5.28)
RDW: 13.1 % (ref 11.7–15.4)
WBC: 6.4 10*3/uL (ref 3.4–10.8)

## 2023-04-05 LAB — LIPID PANEL
Chol/HDL Ratio: 3 ratio (ref 0.0–4.4)
Cholesterol, Total: 189 mg/dL (ref 100–199)
HDL: 63 mg/dL (ref >39)
LDL Chol Calc (NIH): 95 mg/dL (ref 0–99)
Triglycerides: 180 mg/dL — ABNORMAL HIGH (ref 0–149)
VLDL Cholesterol Cal: 31 mg/dL (ref 5–40)

## 2023-04-07 ENCOUNTER — Telehealth: Payer: Self-pay

## 2023-04-07 NOTE — Telephone Encounter (Signed)
Daughter called back, number is [phone].

## 2023-04-07 NOTE — Telephone Encounter (Signed)
Pt daughter called no answer unable to leave a voice mail when she calls back we need number to [facility] [place] for who she talked to so we can call them so we can get the referral to them number we have no person comes to the phone,

## 2023-04-08 NOTE — Telephone Encounter (Signed)
Called left voice mail for the referral coordinator [phone] at the [facility] waiting for a call back

## 2023-04-08 NOTE — Telephone Encounter (Signed)
Pt schedule with [facility] pt daughter called an informed

## 2023-04-20 ENCOUNTER — Telehealth: Payer: Self-pay | Admitting: Cardiology

## 2023-04-20 NOTE — Telephone Encounter (Signed)
Pt told to c/b with bp readings, these readings are from today and yesterday, but she stated they have been around this range since starting medication.    120/75  120/75 109/85 139/78

## 2023-04-20 NOTE — Telephone Encounter (Signed)
Patient notified and verbalized understanding. Patient had no questions or concerns at this time.  

## 2023-04-20 NOTE — Telephone Encounter (Signed)
Overall bp's look fine, continue current meds  Dominga Ferry MD

## 2023-05-13 ENCOUNTER — Ambulatory Visit: Payer: Medicare HMO | Admitting: Cardiology

## 2023-06-01 DIAGNOSIS — R4189 Other symptoms and signs involving cognitive functions and awareness: Secondary | ICD-10-CM | POA: Diagnosis not present

## 2023-06-01 DIAGNOSIS — R42 Dizziness and giddiness: Secondary | ICD-10-CM | POA: Diagnosis not present

## 2023-06-01 DIAGNOSIS — H538 Other visual disturbances: Secondary | ICD-10-CM | POA: Diagnosis not present

## 2023-06-14 DIAGNOSIS — B351 Tinea unguium: Secondary | ICD-10-CM | POA: Diagnosis not present

## 2023-06-14 DIAGNOSIS — M79676 Pain in unspecified toe(s): Secondary | ICD-10-CM | POA: Diagnosis not present

## 2023-06-14 DIAGNOSIS — L84 Corns and callosities: Secondary | ICD-10-CM | POA: Diagnosis not present

## 2023-06-14 DIAGNOSIS — E1142 Type 2 diabetes mellitus with diabetic polyneuropathy: Secondary | ICD-10-CM | POA: Diagnosis not present

## 2023-07-11 DIAGNOSIS — Z789 Other specified health status: Secondary | ICD-10-CM

## 2023-07-11 NOTE — Progress Notes (Signed)
[facility] Customer service manager [facility] Quality Pharmacy Team Statin Quality Measure Assessment   07/11/2023  Katherine Ryan 1956-03-15 [id number]  Per review of chart and payor information, patient has a diagnosis of diabetes but is not currently filling a statin prescription.  This places patient into the Statin Use In Patients with Diabetes (SUPD) measure for CMS.    Patient has documented allergy to statin but no corresponding CPT codes that would exclude patient from SUPD measure.  The 10-year ASCVD risk score (Arnett DK, et al., 2019) is: 15.2%   Values used to calculate the score:     Age: 3 years     Sex: Female     Is Non-Hispanic African American: No     Diabetic: Yes     Tobacco smoker: No     Systolic Blood Pressure: 132 mmHg     Is BP treated: Yes     HDL Cholesterol: 63 mg/dL     Total Cholesterol: 189 mg/dL 4/0/9811     Component Value Date/Time   CHOL 189 04/04/2023 0830   CHOL 151 04/04/2013 1022   TRIG 180 (H) 04/04/2023 0830   TRIG 126 03/17/2015 0932   TRIG 54 04/04/2013 1022   HDL 63 04/04/2023 0830   HDL 63 03/17/2015 0932   HDL 51 04/04/2013 1022   CHOLHDL 3.0 04/04/2023 0830   LDLCALC 95 04/04/2023 0830   LDLCALC 88 08/26/2014 0923   LDLCALC 89 04/04/2013 1022    Please consider ONE of the following recommendations:  Initiate moderate intensity statin Atorvastatin 10 mg once daily, #90, 3 refills   Rosuvastatin 5 mg once daily, #90, 3 refills    Initiate low intensity          statin with reduced frequency if prior          statin intolerance 1x weekly, #13, 3 refills   2x weekly, #26, 3 refills   3x weekly, #39, 3 refills    Code for past statin intolerance or  other exclusions (required annually)  Provider Requirements: Associate code during an office visit or telehealth encounter  Drug Induced Myopathy G72.0   Myopathy, unspecified G72.9   Myositis, unspecified M60.9   Rhabdomyolysis M62.82   Cirrhosis of liver K74.69   Prediabetes R73.03    PCOS E28.2   Thank you for allowing [facility] pharmacy to be a part of this patient's care.  Harlon Flor, PharmD Clinical Pharmacist  [facility] [employer] [phone]

## 2023-07-12 ENCOUNTER — Ambulatory Visit: Payer: Medicare HMO | Admitting: Nurse Practitioner

## 2023-07-12 ENCOUNTER — Encounter: Payer: Self-pay | Admitting: Nurse Practitioner

## 2023-07-12 VITALS — BP 119/75 | Resp 20 | Ht 62.0 in | Wt 200.0 lb

## 2023-07-12 DIAGNOSIS — Z7984 Long term (current) use of oral hypoglycemic drugs: Secondary | ICD-10-CM | POA: Diagnosis not present

## 2023-07-12 DIAGNOSIS — K219 Gastro-esophageal reflux disease without esophagitis: Secondary | ICD-10-CM | POA: Diagnosis not present

## 2023-07-12 DIAGNOSIS — M8588 Other specified disorders of bone density and structure, other site: Secondary | ICD-10-CM | POA: Diagnosis not present

## 2023-07-12 DIAGNOSIS — E1159 Type 2 diabetes mellitus with other circulatory complications: Secondary | ICD-10-CM

## 2023-07-12 DIAGNOSIS — E1169 Type 2 diabetes mellitus with other specified complication: Secondary | ICD-10-CM

## 2023-07-12 DIAGNOSIS — E785 Hyperlipidemia, unspecified: Secondary | ICD-10-CM | POA: Diagnosis not present

## 2023-07-12 DIAGNOSIS — E559 Vitamin D deficiency, unspecified: Secondary | ICD-10-CM | POA: Diagnosis not present

## 2023-07-12 DIAGNOSIS — I152 Hypertension secondary to endocrine disorders: Secondary | ICD-10-CM

## 2023-07-12 DIAGNOSIS — Z6833 Body mass index (BMI) 33.0-33.9, adult: Secondary | ICD-10-CM

## 2023-07-12 DIAGNOSIS — E119 Type 2 diabetes mellitus without complications: Secondary | ICD-10-CM

## 2023-07-12 LAB — CMP14+EGFR
AST: 43 IU/L — ABNORMAL HIGH (ref 0–40)
Albumin: 4.2 g/dL (ref 3.9–4.9)
BUN/Creatinine Ratio: 20 (ref 12–28)
Chloride: 101 mmol/L (ref 96–106)

## 2023-07-12 LAB — BAYER DCA HB A1C WAIVED: HB A1C (BAYER DCA - WAIVED): 7.7 % — ABNORMAL HIGH (ref 4.8–5.6)

## 2023-07-12 LAB — CBC WITH DIFFERENTIAL/PLATELET
Immature Granulocytes: 0 %
Lymphocytes Absolute: 1.1 10*3/uL (ref 0.7–3.1)
RBC: 4.68 x10E6/uL (ref 3.77–5.28)

## 2023-07-12 MED ORDER — LOSARTAN POTASSIUM 100 MG PO TABS: 100.0000 mg | ORAL_TABLET | Freq: Every day | ORAL | 1 refills | Status: DC

## 2023-07-12 MED ORDER — METFORMIN HCL 500 MG PO TABS: 500.0000 mg | ORAL_TABLET | Freq: Every day | ORAL | 1 refills | Status: DC

## 2023-07-12 MED ORDER — AMLODIPINE BESYLATE 5 MG PO TABS: 5.0000 mg | ORAL_TABLET | Freq: Every day | ORAL | 3 refills | Status: DC

## 2023-07-12 MED ORDER — VITAMIN D 50 MCG (2000 UT) PO CAPS: 1.0000 | ORAL_CAPSULE | Freq: Every day | ORAL | 1 refills | Status: DC

## 2023-07-12 NOTE — Progress Notes (Signed)
Subjective:    Patient ID: Katherine Ryan, female    DOB: [birth year], 67 y.o.   MRN: [id number]   Chief Complaint: medical management of chronic issues     HPI:  Katherine Ryan is a 67 y.o. who identifies as a female who was assigned female at birth.   Social history: Lives with: husband who has dementia Work history: retired   Water engineer in today for follow up of the following chronic medical issues:  1. Type 2 diabetes mellitus without complication, without long-term current use of insulin (HCC) She does not check blood sugars at home Lab Results  Component Value Date   HGBA1C 7.2 (H) 04/04/2023     2. Hyperlipidemia associated with type 2 diabetes mellitus (HCC) Does try to watch diet. Is usually on the most recent fab diet. Does not do much exercise. Lab Results  Component Value Date   CHOL 189 04/04/2023   HDL 63 04/04/2023   LDLCALC 95 04/04/2023   TRIG 180 (H) 04/04/2023   CHOLHDL 3.0 04/04/2023     3. Hypertension associated with diabetes (HCC) No c/o chest pain, sob or headache. Doe snot check blood pressure at home BP Readings from Last 3 Encounters:  04/04/23 132/71  02/02/23 (!) 163/83  01/14/23 (!) 148/75    4. Gastroesophageal reflux disease without esophagitis Usually has no symptoms. Uses OTC meds when needed.  5. Osteopenia of lumbar spine No weight bearing exercise. Last dexascan was done 03/30/21. T score was -1.4  6. Vitamin D deficiency Is on daily vitamin d supplement Last vitamin D Lab Results  Component Value Date   VD25OH 34.2 10/07/2017     7. BMI 33.0-33.9,adult No recent weight changes. Wt Readings from Last 3 Encounters:  07/12/23 200 lb (90.7 kg)  04/04/23 202 lb (91.6 kg)  03/23/23 203 lb (92.1 kg)   BMI Readings from Last 3 Encounters:  07/12/23 36.58 kg/m  04/04/23 36.95 kg/m  03/23/23 37.13 kg/m      New complaints: Still having dizziness only when she is sitting. She is seeing neurology. They may send her  to [facility] since they can't find anything wrong with her.  Allergies  Allergen Reactions   Asa Arthritis Strength-Antacid [Aspirin Buffered] Anaphylaxis    GI upset   Ace Inhibitors Cough   Atorvastatin Other (See Comments)   Lisinopril Cough   Nsaids    Outpatient Encounter Medications as of 07/12/2023  Medication Sig   amLODipine (NORVASC) 5 MG tablet Take 1 tablet (5 mg total) by mouth daily.   Biotin-D POWD by Does not apply route.   Calcium Carbonate (CALCIUM 500 PO) Take 1 tablet by mouth daily in the afternoon.   Capsicum, Cayenne, (CAYENNE PO) Take 1 capsule by mouth daily in the afternoon.   Cholecalciferol (VITAMIN D) 50 MCG (2000 UT) CAPS Take 1 capsule (2,000 Units total) by mouth daily.   DULoxetine (CYMBALTA) 30 MG capsule Take 1 capsule every night   Emollient (COLLAGEN EX) Take 2 Scoops by mouth daily.   Evolocumab (REPATHA SURECLICK) 140 MG/ML SOAJ Inject 140 mg into the skin every 14 (fourteen) days.   losartan (COZAAR) 100 MG tablet Take 1 tablet (100 mg total) by mouth daily.   Magnesium 250 MG TABS Take 1 tablet by mouth daily in the afternoon.   metFORMIN (GLUCOPHAGE) 500 MG tablet Take 1 tablet (500 mg total) by mouth daily with breakfast.   No facility-administered encounter medications on file as of 07/12/2023.    Past Surgical History:  Procedure Laterality Date   BELPHAROPTOSIS REPAIR Right 07/23/2019   CARPAL TUNNEL RELEASE Right    CATARACT PEDIATRIC     right    Family History  Problem Relation Age of Onset   Diabetes Mother    Hypertension Mother    Asthma Father    Arthritis Father       Controlled substance contract: n/a     Review of Systems  Constitutional:  Negative for diaphoresis.  Eyes:  Negative for pain.  Respiratory:  Negative for shortness of breath.   Cardiovascular:  Negative for chest pain, palpitations and leg swelling.  Gastrointestinal:  Negative for abdominal pain.  Endocrine: Negative for polydipsia.  Skin:   Negative for rash.  Neurological:  Negative for dizziness, weakness and headaches.  Hematological:  Does not bruise/bleed easily.  All other systems reviewed and are negative.      Objective:   Physical Exam Vitals and nursing note reviewed.  Constitutional:      General: She is not in acute distress.    Appearance: Normal appearance. She is well-developed.  HENT:     Head: Normocephalic.     Right Ear: Tympanic membrane normal.     Left Ear: Tympanic membrane normal.     Nose: Nose normal.     Mouth/Throat:     Mouth: Mucous membranes are moist.  Eyes:     Pupils: Pupils are equal, round, and reactive to light.     Comments: Right  artificial eye  Neck:     Vascular: No carotid bruit or JVD.  Cardiovascular:     Rate and Rhythm: Normal rate and regular rhythm.     Heart sounds: Normal heart sounds.  Pulmonary:     Effort: Pulmonary effort is normal. No respiratory distress.     Breath sounds: Normal breath sounds. No wheezing or rales.  Chest:     Chest wall: No tenderness.  Abdominal:     General: Bowel sounds are normal. There is no distension or abdominal bruit.     Palpations: Abdomen is soft. There is no hepatomegaly, splenomegaly, mass or pulsatile mass.     Tenderness: There is no abdominal tenderness.  Musculoskeletal:        General: Normal range of motion.     Cervical back: Normal range of motion and neck supple.  Lymphadenopathy:     Cervical: No cervical adenopathy.  Skin:    General: Skin is warm and dry.  Neurological:     Mental Status: She is alert and oriented to person, place, and time.     Deep Tendon Reflexes: Reflexes are normal and symmetric.  Psychiatric:        Behavior: Behavior normal.        Thought Content: Thought content normal.        Judgment: Judgment normal.     BP 119/75   Resp 20   Ht 5\' 2"  (1.575 m)   Wt 200 lb (90.7 kg)   LMP 04/04/2005   BMI 36.58 kg/m   HGBA1c 7.7%        Assessment & Plan:  Katherine Ryan  comes in today with chief complaint of Medical Management of Chronic Issues   Diagnosis and orders addressed:  1. Type 2 diabetes mellitus without complication, without long-term current use of insulin (HCC) Stricter carb counting - Bayer DCA Hb A1c Waived - metFORMIN (GLUCOPHAGE) 500 MG tablet; Take 1 tablet (500 mg total) by mouth daily with breakfast.  Dispense: 90 tablet; Refill:  1  2. Hyperlipidemia associated with type 2 diabetes mellitus (HCC) Low fta diet - Lipid panel  3. Hypertension associated with diabetes (HCC) Low sodium diet - CBC with Differential/Platelet - CMP14+EGFR - losartan (COZAAR) 100 MG tablet; Take 1 tablet (100 mg total) by mouth daily.  Dispense: 90 tablet; Refill: 1 - amLODipine (NORVASC) 5 MG tablet; Take 1 tablet (5 mg total) by mouth daily.  Dispense: 90 tablet; Refill: 3  4. Gastroesophageal reflux disease without esophagitis Avoid spicy foods Do not eat 2 hours prior to bedtime   5. Osteopenia of lumbar spine Weight bearing exercises Refuses dexascan today  6. Vitamin D deficiency Continue vitamin d supplement - Cholecalciferol (VITAMIN D) 50 MCG (2000 UT) CAPS; Take 1 capsule (2,000 Units total) by mouth daily.  Dispense: 90 capsule; Refill: 1  7. BMI 33.0-33.9,adult Discussed diet and exercise for person with BMI >25 Will recheck weight in 3-6 months    Labs pending Health Maintenance reviewed Diet and exercise encouraged  Follow up plan: 3 months   Katherine Daphine Deutscher, FNP

## 2023-07-12 NOTE — Patient Instructions (Signed)

## 2023-07-13 LAB — CBC WITH DIFFERENTIAL/PLATELET
Basophils Absolute: 0 10*3/uL (ref 0.0–0.2)
Basos: 1 %
EOS (ABSOLUTE): 0.1 10*3/uL (ref 0.0–0.4)
Eos: 2 %
Hematocrit: 40.5 % (ref 34.0–46.6)
Hemoglobin: 13.8 g/dL (ref 11.1–15.9)
Immature Grans (Abs): 0 10*3/uL (ref 0.0–0.1)
Lymphs: 18 %
MCH: 29.5 pg (ref 26.6–33.0)
MCHC: 34.1 g/dL (ref 31.5–35.7)
MCV: 87 fL (ref 79–97)
Monocytes Absolute: 0.8 10*3/uL (ref 0.1–0.9)
Monocytes: 13 %
Neutrophils Absolute: 4.1 10*3/uL (ref 1.4–7.0)
Neutrophils: 66 %
Platelets: 272 10*3/uL (ref 150–450)
RDW: 13.2 % (ref 11.7–15.4)
WBC: 6.1 10*3/uL (ref 3.4–10.8)

## 2023-07-13 LAB — CMP14+EGFR
ALT: 55 IU/L — ABNORMAL HIGH (ref 0–32)
Alkaline Phosphatase: 82 IU/L (ref 44–121)
BUN: 15 mg/dL (ref 8–27)
Bilirubin Total: 0.2 mg/dL (ref 0.0–1.2)
CO2: 24 mmol/L (ref 20–29)
Calcium: 9.5 mg/dL (ref 8.7–10.3)
Creatinine, Ser: 0.76 mg/dL (ref 0.57–1.00)
Globulin, Total: 3.1 g/dL (ref 1.5–4.5)
Glucose: 170 mg/dL — ABNORMAL HIGH (ref 70–99)
Potassium: 4.8 mmol/L (ref 3.5–5.2)
Sodium: 137 mmol/L (ref 134–144)
Total Protein: 7.3 g/dL (ref 6.0–8.5)
eGFR: 86 mL/min/{1.73_m2} (ref >59)

## 2023-07-13 LAB — LIPID PANEL
Chol/HDL Ratio: 2.6 ratio (ref 0.0–4.4)
Cholesterol, Total: 179 mg/dL (ref 100–199)
HDL: 68 mg/dL (ref >39)
LDL Chol Calc (NIH): 97 mg/dL (ref 0–99)
Triglycerides: 75 mg/dL (ref 0–149)
VLDL Cholesterol Cal: 14 mg/dL (ref 5–40)

## 2023-07-21 DIAGNOSIS — S0571XD Avulsion of right eye, subsequent encounter: Secondary | ICD-10-CM | POA: Diagnosis not present

## 2023-07-25 DIAGNOSIS — E119 Type 2 diabetes mellitus without complications: Secondary | ICD-10-CM | POA: Diagnosis not present

## 2023-07-25 DIAGNOSIS — Z87891 Personal history of nicotine dependence: Secondary | ICD-10-CM | POA: Diagnosis not present

## 2023-07-25 DIAGNOSIS — I10 Essential (primary) hypertension: Secondary | ICD-10-CM | POA: Diagnosis not present

## 2023-07-25 DIAGNOSIS — Z8249 Family history of ischemic heart disease and other diseases of the circulatory system: Secondary | ICD-10-CM | POA: Diagnosis not present

## 2023-07-25 DIAGNOSIS — R32 Unspecified urinary incontinence: Secondary | ICD-10-CM | POA: Diagnosis not present

## 2023-07-25 DIAGNOSIS — E559 Vitamin D deficiency, unspecified: Secondary | ICD-10-CM | POA: Diagnosis not present

## 2023-07-25 DIAGNOSIS — Z7984 Long term (current) use of oral hypoglycemic drugs: Secondary | ICD-10-CM | POA: Diagnosis not present

## 2023-07-25 DIAGNOSIS — Z6836 Body mass index (BMI) 36.0-36.9, adult: Secondary | ICD-10-CM | POA: Diagnosis not present

## 2023-07-25 DIAGNOSIS — E785 Hyperlipidemia, unspecified: Secondary | ICD-10-CM | POA: Diagnosis not present

## 2023-08-10 ENCOUNTER — Ambulatory Visit: Payer: Medicare HMO | Admitting: Neurology

## 2023-08-23 DIAGNOSIS — B351 Tinea unguium: Secondary | ICD-10-CM | POA: Diagnosis not present

## 2023-08-23 DIAGNOSIS — E1142 Type 2 diabetes mellitus with diabetic polyneuropathy: Secondary | ICD-10-CM | POA: Diagnosis not present

## 2023-08-23 DIAGNOSIS — M79676 Pain in unspecified toe(s): Secondary | ICD-10-CM | POA: Diagnosis not present

## 2023-08-23 DIAGNOSIS — L84 Corns and callosities: Secondary | ICD-10-CM | POA: Diagnosis not present

## 2023-10-14 NOTE — Patient Instructions (Signed)

## 2023-10-25 DIAGNOSIS — E1142 Type 2 diabetes mellitus with diabetic polyneuropathy: Secondary | ICD-10-CM | POA: Diagnosis not present

## 2023-10-25 DIAGNOSIS — B351 Tinea unguium: Secondary | ICD-10-CM | POA: Diagnosis not present

## 2023-10-25 DIAGNOSIS — M79676 Pain in unspecified toe(s): Secondary | ICD-10-CM | POA: Diagnosis not present

## 2023-10-25 DIAGNOSIS — L84 Corns and callosities: Secondary | ICD-10-CM | POA: Diagnosis not present

## 2023-10-27 ENCOUNTER — Encounter: Payer: Self-pay | Admitting: Nurse Practitioner

## 2023-10-27 ENCOUNTER — Other Ambulatory Visit: Payer: Medicare HMO

## 2023-10-27 ENCOUNTER — Ambulatory Visit: Payer: Medicare HMO | Admitting: Nurse Practitioner

## 2023-10-27 VITALS — BP 134/76 | HR 72 | Temp 97.7°F | Resp 20 | Ht 62.0 in | Wt 202.0 lb

## 2023-10-27 DIAGNOSIS — I152 Hypertension secondary to endocrine disorders: Secondary | ICD-10-CM | POA: Diagnosis not present

## 2023-10-27 DIAGNOSIS — E785 Hyperlipidemia, unspecified: Secondary | ICD-10-CM | POA: Diagnosis not present

## 2023-10-27 DIAGNOSIS — K219 Gastro-esophageal reflux disease without esophagitis: Secondary | ICD-10-CM

## 2023-10-27 DIAGNOSIS — E119 Type 2 diabetes mellitus without complications: Secondary | ICD-10-CM

## 2023-10-27 DIAGNOSIS — E1159 Type 2 diabetes mellitus with other circulatory complications: Secondary | ICD-10-CM

## 2023-10-27 DIAGNOSIS — M8588 Other specified disorders of bone density and structure, other site: Secondary | ICD-10-CM | POA: Diagnosis not present

## 2023-10-27 DIAGNOSIS — E1169 Type 2 diabetes mellitus with other specified complication: Secondary | ICD-10-CM | POA: Diagnosis not present

## 2023-10-27 DIAGNOSIS — Z6833 Body mass index (BMI) 33.0-33.9, adult: Secondary | ICD-10-CM

## 2023-10-27 DIAGNOSIS — M545 Low back pain, unspecified: Secondary | ICD-10-CM

## 2023-10-27 DIAGNOSIS — E559 Vitamin D deficiency, unspecified: Secondary | ICD-10-CM

## 2023-10-27 LAB — BAYER DCA HB A1C WAIVED: HB A1C (BAYER DCA - WAIVED): 7.8 % — ABNORMAL HIGH (ref 4.8–5.6)

## 2023-10-27 MED ORDER — METFORMIN HCL 500 MG PO TABS
500.0000 mg | ORAL_TABLET | Freq: Every day | ORAL | 1 refills | Status: DC
Start: 2023-10-27 — End: 2023-10-27

## 2023-10-27 MED ORDER — VITAMIN D 50 MCG (2000 UT) PO CAPS: 1.0000 | ORAL_CAPSULE | Freq: Every day | ORAL | 1 refills | Status: DC

## 2023-10-27 MED ORDER — REPATHA SURECLICK 140 MG/ML ~~LOC~~ SOAJ
140.0000 mg | SUBCUTANEOUS | 4 refills | Status: DC
Start: 2023-10-27 — End: 2024-05-01

## 2023-10-27 MED ORDER — METFORMIN HCL 1000 MG PO TABS: 1000.0000 mg | ORAL_TABLET | Freq: Two times a day (BID) | ORAL | 1 refills | Status: DC

## 2023-10-27 MED ORDER — LOSARTAN POTASSIUM 100 MG PO TABS
100.0000 mg | ORAL_TABLET | Freq: Every day | ORAL | 1 refills | Status: DC
Start: 2023-10-27 — End: 2024-02-03

## 2023-10-27 MED ORDER — AMLODIPINE BESYLATE 5 MG PO TABS
5.0000 mg | ORAL_TABLET | Freq: Every day | ORAL | 1 refills | Status: DC
Start: 2023-10-27 — End: 2024-02-03

## 2023-10-27 NOTE — Progress Notes (Signed)
Subjective:    Patient ID: Katherine Ryan, female    DOB: November 16, 1956, 67 y.o.   MRN: [id number]   Chief Complaint: medical management of chronic issues     HPI:  Katherine Ryan is a 67 y.o. who identifies as a female who was assigned female at birth.   Social history: Lives with: husband Work history: retired   Water engineer in today for follow up of the following chronic medical issues:  1. Hypertension associated with diabetes (HCC) No c/o chest pain, sob or headache. Does not check blood pressure at home except occasionally. BP Readings from Last 3 Encounters:  07/12/23 119/75  04/04/23 132/71  02/02/23 (!) 163/83     2. Hyperlipidemia associated with type 2 diabetes mellitus (HCC) Does try to watch diet. Does no dedictaed exercise. Lab Results  Component Value Date   CHOL 179 07/12/2023   HDL 68 07/12/2023   LDLCALC 97 07/12/2023   TRIG 75 07/12/2023   CHOLHDL 2.6 07/12/2023     3. Type 2 diabetes mellitus without complication, without long-term current use of insulin (HCC) Fasting blood sugars are running around 150's usually Lab Results  Component Value Date   HGBA1C 7.7 (H) 07/12/2023  '  4. Gastroesophageal reflux disease without esophagitis Uses OTC meds if needed  5. Vitamin D deficiency Is on vitamin d supplement daily  6. Osteopenia of lumbar spine Last dexascan was done on 03/30/21. T score was -1.4  7. BMI 33.0-33.9,adult No recent weight changes Wt Readings from Last 3 Encounters:  10/27/23 202 lb (91.6 kg)  07/12/23 200 lb (90.7 kg)  04/04/23 202 lb (91.6 kg)   BMI Readings from Last 3 Encounters:  10/27/23 36.95 kg/m  07/12/23 36.58 kg/m  04/04/23 36.95 kg/m     New complaints: Low back pain for over 2 years. Exercise and standing increase pain. She has seen chiropractor and that did not help. NSAIDS help some. Rates pain 5/10 at times.   Allergies  Allergen Reactions   Asa Arthritis Strength-Antacid [Aspirin Buffered] Anaphylaxis     GI upset   Ace Inhibitors Cough   Atorvastatin Other (See Comments)   Lisinopril Cough   Nsaids    Outpatient Encounter Medications as of 10/27/2023  Medication Sig   amLODipine (NORVASC) 5 MG tablet Take 1 tablet (5 mg total) by mouth daily.   Biotin-D POWD by Does not apply route.   Calcium Carbonate (CALCIUM 500 PO) Take 1 tablet by mouth daily in the afternoon.   Capsicum, Cayenne, (CAYENNE PO) Take 1 capsule by mouth daily in the afternoon.   Cholecalciferol (VITAMIN D) 50 MCG (2000 UT) CAPS Take 1 capsule (2,000 Units total) by mouth daily.   Emollient (COLLAGEN EX) Take 2 Scoops by mouth daily.   Evolocumab (REPATHA SURECLICK) 140 MG/ML SOAJ Inject 140 mg into the skin every 14 (fourteen) days.   losartan (COZAAR) 100 MG tablet Take 1 tablet (100 mg total) by mouth daily.   Magnesium 250 MG TABS Take 1 tablet by mouth daily in the afternoon.   metFORMIN (GLUCOPHAGE) 500 MG tablet Take 1 tablet (500 mg total) by mouth daily with breakfast.   No facility-administered encounter medications on file as of 10/27/2023.    Past Surgical History:  Procedure Laterality Date   BELPHAROPTOSIS REPAIR Right 07/23/2019   CARPAL TUNNEL RELEASE Right    CATARACT PEDIATRIC     right    Family History  Problem Relation Age of Onset   Diabetes Mother    Hypertension  Mother    Asthma Father    Arthritis Father       Controlled substance contract: n/a     Review of Systems  Constitutional:  Negative for diaphoresis.  Eyes:  Negative for pain.  Respiratory:  Negative for shortness of breath.   Cardiovascular:  Negative for chest pain, palpitations and leg swelling.  Gastrointestinal:  Negative for abdominal pain.  Endocrine: Negative for polydipsia.  Skin:  Negative for rash.  Neurological:  Negative for dizziness, weakness and headaches.  Hematological:  Does not bruise/bleed easily.  All other systems reviewed and are negative.      Objective:   Physical Exam Vitals  and nursing note reviewed.  Constitutional:      General: She is not in acute distress.    Appearance: Normal appearance. She is well-developed.  HENT:     Head: Normocephalic.     Right Ear: Tympanic membrane normal.     Left Ear: Tympanic membrane normal.     Nose: Nose normal.     Mouth/Throat:     Mouth: Mucous membranes are moist.  Eyes:     Pupils: Pupils are equal, round, and reactive to light.  Neck:     Vascular: No carotid bruit or JVD.  Cardiovascular:     Rate and Rhythm: Normal rate and regular rhythm.     Heart sounds: Normal heart sounds.  Pulmonary:     Effort: Pulmonary effort is normal. No respiratory distress.     Breath sounds: Normal breath sounds. No wheezing or rales.  Chest:     Chest wall: No tenderness.  Abdominal:     General: Bowel sounds are normal. There is no distension or abdominal bruit.     Palpations: Abdomen is soft. There is no hepatomegaly, splenomegaly, mass or pulsatile mass.     Tenderness: There is no abdominal tenderness.  Musculoskeletal:        General: Normal range of motion.     Cervical back: Normal range of motion and neck supple.     Right lower leg: Edema (1+) present.     Left lower leg: Edema (1+) present.     Comments: FROM of lumbar spine without pain today (-) SLR bil Motor strength and sensation intact bil  Lymphadenopathy:     Cervical: No cervical adenopathy.  Skin:    General: Skin is warm and dry.  Neurological:     Mental Status: She is alert and oriented to person, place, and time.     Deep Tendon Reflexes: Reflexes are normal and symmetric.  Psychiatric:        Behavior: Behavior normal.        Thought Content: Thought content normal.        Judgment: Judgment normal.      BP 134/76   Pulse 72   Temp 97.7 F (36.5 C) (Temporal)   Resp 20   Ht 5\' 2"  (1.575 m)   Wt 202 lb (91.6 kg)   LMP 04/04/2005   SpO2 95%   BMI 36.95 kg/m   Hgba1c 7.8%    Assessment & Plan:   Katherine Ryan comes in  today with chief complaint of Medical Management of Chronic Issues   Diagnosis and orders addressed:  1. Hypertension associated with diabetes (HCC) Low sodium diet - CBC with Differential/Platelet - CMP14+EGFR - amLODipine (NORVASC) 5 MG tablet; Take 1 tablet (5 mg total) by mouth daily.  Dispense: 90 tablet; Refill: 1 - losartan (COZAAR) 100 MG tablet; Take  1 tablet (100 mg total) by mouth daily.  Dispense: 90 tablet; Refill: 1  2. Hyperlipidemia associated with type 2 diabetes mellitus (HCC) Low fat diet - Lipid panel - Evolocumab (REPATHA SURECLICK) 140 MG/ML SOAJ; Inject 140 mg into the skin every 14 (fourteen) days.  Dispense: 6 mL; Refill: 4  3. Type 2 diabetes mellitus without complication, without long-term current use of insulin (HCC) Stricter carb counting Increase metformin 1000mg  bid Keep diary of blood sugars - Bayer DCA Hb A1c Waived - Microalbumin / creatinine urine ratio - metFORMIN (GLUCOPHAGE) 500 MG tablet; Take 1 tablet (500 mg total) by mouth daily with breakfast.  Dispense: 90 tablet; Refill: 1  4. Gastroesophageal reflux disease without esophagitis Avoid spicy foods Do not eat 2 hours prior to bedtime   5. Vitamin D deficiency Continue vitamin d supplement - Cholecalciferol (VITAMIN D) 50 MCG (2000 UT) CAPS; Take 1 capsule (2,000 Units total) by mouth daily.  Dispense: 90 capsule; Refill: 1  6. Osteopenia of lumbar spine Weight bearing exrcises  7. BMI 33.0-33.9,adult Discussed diet and exercise for person with BMI >25 Will recheck weight in 3-6 months   8. Acute midline low back pain without sciatica Referral to ortho - Ambulatory referral to Orthopedic Surgery   Labs pending Health Maintenance reviewed Diet and exercise encouraged  Follow up plan: 3 months   Mary-Margaret Daphine Deutscher, FNP

## 2023-10-28 LAB — CBC WITH DIFFERENTIAL/PLATELET
Basophils Absolute: 0 10*3/uL (ref 0.0–0.2)
Basos: 0 %
EOS (ABSOLUTE): 0.2 10*3/uL (ref 0.0–0.4)
Eos: 3 %
Hematocrit: 42.7 % (ref 34.0–46.6)
Hemoglobin: 13.8 g/dL (ref 11.1–15.9)
Immature Grans (Abs): 0 10*3/uL (ref 0.0–0.1)
Immature Granulocytes: 0 %
Lymphocytes Absolute: 1.5 10*3/uL (ref 0.7–3.1)
Lymphs: 22 %
MCH: 29 pg (ref 26.6–33.0)
MCHC: 32.3 g/dL (ref 31.5–35.7)
MCV: 90 fL (ref 79–97)
Monocytes Absolute: 0.8 10*3/uL (ref 0.1–0.9)
Monocytes: 12 %
Neutrophils Absolute: 4.3 10*3/uL (ref 1.4–7.0)
Neutrophils: 63 %
Platelets: 273 10*3/uL (ref 150–450)
RBC: 4.76 x10E6/uL (ref 3.77–5.28)
RDW: 14.2 % (ref 11.7–15.4)
WBC: 6.8 10*3/uL (ref 3.4–10.8)

## 2023-10-28 LAB — CMP14+EGFR
ALT: 44 [IU]/L — ABNORMAL HIGH (ref 0–32)
AST: 28 [IU]/L (ref 0–40)
Albumin: 4.4 g/dL (ref 3.9–4.9)
Alkaline Phosphatase: 87 [IU]/L (ref 44–121)
BUN/Creatinine Ratio: 19 (ref 12–28)
BUN: 14 mg/dL (ref 8–27)
Bilirubin Total: 0.3 mg/dL (ref 0.0–1.2)
CO2: 24 mmol/L (ref 20–29)
Calcium: 9.7 mg/dL (ref 8.7–10.3)
Chloride: 99 mmol/L (ref 96–106)
Creatinine, Ser: 0.74 mg/dL (ref 0.57–1.00)
Globulin, Total: 3.2 g/dL (ref 1.5–4.5)
Glucose: 199 mg/dL — ABNORMAL HIGH (ref 70–99)
Potassium: 4.9 mmol/L (ref 3.5–5.2)
Sodium: 138 mmol/L (ref 134–144)
Total Protein: 7.6 g/dL (ref 6.0–8.5)
eGFR: 89 mL/min/{1.73_m2} (ref >59)

## 2023-10-28 LAB — LIPID PANEL
Chol/HDL Ratio: 3.7 ratio (ref 0.0–4.4)
Cholesterol, Total: 246 mg/dL — ABNORMAL HIGH (ref 100–199)
HDL: 67 mg/dL (ref >39)
LDL Chol Calc (NIH): 151 mg/dL — ABNORMAL HIGH (ref 0–99)
Triglycerides: 160 mg/dL — ABNORMAL HIGH (ref 0–149)
VLDL Cholesterol Cal: 28 mg/dL (ref 5–40)

## 2023-10-28 LAB — MICROALBUMIN / CREATININE URINE RATIO
Creatinine, Urine: 11.5 mg/dL
Microalb/Creat Ratio: 67 mg/g{creat} — ABNORMAL HIGH (ref 0–29)
Microalbumin, Urine: 7.7 ug/mL

## 2023-11-09 DIAGNOSIS — M545 Low back pain, unspecified: Secondary | ICD-10-CM | POA: Diagnosis not present

## 2023-11-21 ENCOUNTER — Other Ambulatory Visit: Payer: Self-pay

## 2023-11-21 ENCOUNTER — Ambulatory Visit: Payer: Medicare HMO | Attending: Family Medicine | Admitting: Physical Therapy

## 2023-11-21 DIAGNOSIS — M62830 Muscle spasm of back: Secondary | ICD-10-CM | POA: Diagnosis not present

## 2023-11-21 DIAGNOSIS — M5459 Other low back pain: Secondary | ICD-10-CM | POA: Diagnosis not present

## 2023-11-21 NOTE — Therapy (Signed)
OUTPATIENT PHYSICAL THERAPY THORACOLUMBAR EVALUATION   Patient Name: Katherine Ryan MRN: [id number] DOB:04-17-1956, 67 y.o., female Today's Date: 11/21/2023  END OF SESSION:  PT End of Session - 11/21/23 1039     Visit Number 1    Number of Visits 12    Date for PT Re-Evaluation 12/19/23    Authorization Type FOTO.    PT Start Time 1016    PT Stop Time 1101    PT Time Calculation (min) 45 min    Activity Tolerance Patient tolerated treatment well    Behavior During Therapy WFL for tasks assessed/performed             Past Medical History:  Diagnosis Date   Blindness of right eye with low vision in contralateral eye    Diabetes mellitus without complication (HCC)    GERD (gastroesophageal reflux disease)    Hyperlipidemia    Past Surgical History:  Procedure Laterality Date   BELPHAROPTOSIS REPAIR Right 07/23/2019   CARPAL TUNNEL RELEASE Right    CATARACT PEDIATRIC     right   Patient Active Problem List   Diagnosis Date Noted   Carpal tunnel syndrome of left wrist 12/04/2020   Hypertension associated with diabetes (HCC) 09/07/2016   BMI 33.0-33.9,adult 10/16/2015   Hyperlipidemia associated with type 2 diabetes mellitus (HCC) 02/15/2014   Diabetes (HCC) 08/09/2013   Vitamin D deficiency 05/06/2011   Osteopenia 05/06/2011   Presence of artificial right eye 05/06/2011   GERD (gastroesophageal reflux disease) 05/06/2011   REFERRING PROVIDER: Arlyce Harman DO  REFERRING DIAG: Low back pain.  Rationale for Evaluation and Treatment: Rehabilitation  THERAPY DIAG:  Other low back pain  Muscle spasm of back  ONSET DATE: ~2 years.  SUBJECTIVE:                                                                                                                                                                                           SUBJECTIVE STATEMENT: The patient presents to the clinic with about a 2 year h/o low back pain.  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  One motion involved extending her left LE and putting a lot of pressure down.  Her pain is rated at a 5-6/10 and is described as an ache.  Ice and sitting down decreases her pain.  Bending over and standing for long periods of time increases her pain.    PERTINENT HISTORY:  Osteopenia.  PAIN:  Are you having pain? Yes: NPRS scale: 5-6/10 Pain location: Left low back/SIJ region. Pain description: Ache. Aggravating factors: As  above. Relieving factors: As above.  PRECAUTIONS: None  RED FLAGS: None   WEIGHT BEARING RESTRICTIONS: No  FALLS:  Has patient fallen in last 6 months? 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  PLOF: Independent  PATIENT GOALS: Get around and do more with less pain.    OBJECTIVE:  Note: Objective measures were completed at Evaluation unless otherwise noted.  POSTURE: No Significant postural limitations  PALPATION: Tender to palpation over left SIJ and proximal gluteal region.  LUMBAR ROM:   Active active lumbar flexion and extension.  LOWER EXTREMITY ROM:     WNL.  LOWER EXTREMITY MMT:   Normal LE strength.  LUMBAR SPECIAL TESTS:  Equal leg lengths. (-) SLR.  Mild pain with left FABER and Sacral Press test.  GAIT: WNL.  TODAY'S TREATMENT:                                                                                                                              DATE: HMP and IFC at 80-150 Hz on 40% scan x 20 minutes to patient's left low back/SIJ/proximal gluteal region.   Normal modality response following removal of modality.   PATIENT EDUCATION:  Education details: See below. Person educated: Patient Education method: Explanation Education comprehension: verbalized understanding  HOME EXERCISE PROGRAM:  HOME EXERCISE  PROGRAM Created by [place] Sheilyn Boehlke Nov 25th, 2024 View at [email or web address] using code: [email or web address]  Page 1 of 1 2 Exercises SINGLE KNEE TO CHEST STRETCH - SKTC While Lying on your back, hold your knee and gently pull it up towards your chest. Repeat 3 Times Hold 30 Seconds Complete 1 Set Perform 3 Times a Day Double Knee to chest stretch Pull your knees up to your chest hold for 30 seconds then relax. Repeat Continue to breathe throughout stretch. Repeat 2 Times Hold 30 Seconds Complete 3 Sets Perform 2 Times a Day  ASSESSMENT:  CLINICAL IMPRESSION: The patient presents to OPP[facility] with approximately a 2 year h/o of low back pain.  She is tender to palpation over left SIJ and proximal gluteal region.  She has some pain reproduction with a left FABER and Sacral Press test.  She exhibits full active lumbar flexion and extension and normal LE strength.  Her LE DTR's are normal.  Her FOTO limitation score is a 57.23.  Patient will benefit from skilled physical therapy intervention to address pain and deficits.  OBJECTIVE IMPAIRMENTS: decreased activity tolerance, increased muscle spasms, and pain.   ACTIVITY LIMITATIONS: bending and standing  PARTICIPATION LIMITATIONS: laundry  PERSONAL FACTORS: Time since onset of injury/illness/exacerbation are also affecting patient's functional outcome.   REHAB POTENTIAL: Good  CLINICAL DECISION MAKING: Stable/uncomplicated  EVALUATION COMPLEXITY: Low   GOALS:  SHORT TERM GOALS: Target date: 12/19/23  Ind with a HEP. Goal status: INITIAL  2.  Perform ADL's with pain not > 2-3/10.  Goal status: INITIAL  3.  Stand 20-30 minutes with pain not > 2-3/10.  Goal status: INITIAL  PLAN:  PT FREQUENCY:  2-3 times a week  PT DURATION: 4 weeks  PLANNED INTERVENTIONS: 97110-Therapeutic exercises, 97530- Therapeutic activity, [id number]- Neuromuscular re-education, 97535- Self Care, [id number]- Manual therapy, 97014- Electrical stimulation  (unattended), Patient/Family education, Dry Needling, Cryotherapy, and Moist heat.  PLAN FOR NEXT SESSION: Combo e'stim/US, STW/M, Core exercise progression, spinal protection techniques and body mechanics training.   June Rode, [place], PT 11/21/2023, 11:13 AM

## 2023-11-28 ENCOUNTER — Ambulatory Visit: Payer: Medicare HMO | Attending: Family Medicine

## 2023-11-28 DIAGNOSIS — M5459 Other low back pain: Secondary | ICD-10-CM | POA: Diagnosis not present

## 2023-11-28 DIAGNOSIS — M62830 Muscle spasm of back: Secondary | ICD-10-CM | POA: Diagnosis not present

## 2023-11-28 NOTE — Therapy (Signed)
OUTPATIENT PHYSICAL THERAPY THORACOLUMBAR TREATMENT   Patient Name: Katherine Ryan MRN: [id number] DOB:[birth year], 67 y.o., female Today's Date: 11/28/2023  END OF SESSION:  PT End of Session - 11/28/23 1023     Visit Number 2    Number of Visits 12    Date for PT Re-Evaluation 12/19/23    Authorization Type FOTO.    PT Start Time 1018    PT Stop Time 1100    PT Time Calculation (min) 42 min    Activity Tolerance Patient tolerated treatment well    Behavior During Therapy WFL for tasks assessed/performed              Past Medical History:  Diagnosis Date   Blindness of right eye with low vision in contralateral eye    Diabetes mellitus without complication (HCC)    GERD (gastroesophageal reflux disease)    Hyperlipidemia    Past Surgical History:  Procedure Laterality Date   BELPHAROPTOSIS REPAIR Right 07/23/2019   CARPAL TUNNEL RELEASE Right    CATARACT PEDIATRIC     right   Patient Active Problem List   Diagnosis Date Noted   Carpal tunnel syndrome of left wrist 12/04/2020   Hypertension associated with diabetes (HCC) 09/07/2016   BMI 33.0-33.9,adult 10/16/2015   Hyperlipidemia associated with type 2 diabetes mellitus (HCC) 02/15/2014   Diabetes (HCC) 08/09/2013   Vitamin D deficiency 05/06/2011   Osteopenia 05/06/2011   Presence of artificial right eye 05/06/2011   GERD (gastroesophageal reflux disease) 05/06/2011   REFERRING PROVIDER: Arlyce Harman DO  REFERRING DIAG: Low back pain.  Rationale for Evaluation and Treatment: Rehabilitation  THERAPY DIAG:  Other low back pain  Muscle spasm of back  ONSET DATE: ~2 years.  SUBJECTIVE:                                                                                                                                                                                           SUBJECTIVE STATEMENT: Patient reports that she feels alright today. However, she felt bad after her last appointment. She tried  doing her exercises at home, but her knees started popping.   PERTINENT HISTORY:  Osteopenia.  PAIN:  Are you having pain? Yes: NPRS scale: 3/10 Pain location: Left low back/SIJ region. Pain description: Ache. Aggravating factors: As above. Relieving factors: As above.  PRECAUTIONS: None  RED FLAGS: None   WEIGHT BEARING RESTRICTIONS: No  FALLS:  Has patient fallen in last 6 months? 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  PLOF: Independent  PATIENT GOALS: Get around and do more with less pain.    OBJECTIVE:  Note: Objective measures were completed at Evaluation unless otherwise noted.  POSTURE: No Significant postural limitations  PALPATION: Tender to palpation over left SIJ and proximal gluteal region.  LUMBAR ROM:   Active active lumbar flexion and extension.  LOWER EXTREMITY ROM:     WNL.  LOWER EXTREMITY MMT:   Normal LE strength.  LUMBAR SPECIAL TESTS:  Equal leg lengths. (-) SLR.  Mild pain with left FABER and Sacral Press test.  GAIT: WNL.  TODAY'S TREATMENT:                                                                                                                              DATE:                                    11/28/23 EXERCISE LOG  Exercise Repetitions and Resistance Comments  Nustep L4 x 14 minutes   Lower trunk rotation  20 reps    SLR 20 reps each   Supine clams  Green t-band x 2 minutes   Supine hip ADD isometric  20 reps w/ 5 second hold        Blank cell = exercise not performed today  Manual Therapy Soft Tissue Mobilization: left gluteals and piriformis, for reduced pain and tone    PATIENT EDUCATION:  Education details: See below. Person educated: Patient Education method: Explanation Education comprehension: verbalized understanding  HOME EXERCISE PROGRAM:  HOME EXERCISE  PROGRAM Created by [place] Applegate Nov 25th, 2024 View at [email or web address] using code: [email or web address]  Page 1 of 1 2 Exercises SINGLE KNEE TO CHEST STRETCH - SKTC While Lying on your back, hold your knee and gently pull it up towards your chest. Repeat 3 Times Hold 30 Seconds Complete 1 Set Perform 3 Times a Day Double Knee to chest stretch Pull your knees up to your chest hold for 30 seconds then relax. Repeat Continue to breathe throughout stretch. Repeat 2 Times Hold 30 Seconds Complete 3 Sets Perform 2 Times a Day  ASSESSMENT:  CLINICAL IMPRESSION: Patient was introduced to multiple new interventions for improved lumbar mobility and stability. She required minimal cueing with today's new interventions for proper exercise performance. Manual therapy focused on soft tissue mobilization to her left piriformis and gluteals due to increased pain and tone with good results. She experienced a mild increase in her familiar symptoms with the hip adduction isometric, but this did not limit her ability to complete any of today's interventions. She reported feeling a little better upon the conclusion of treatment. She continues to require skilled physical therapy to address her remaining impairments to return to her prior level of function.   OBJECTIVE IMPAIRMENTS: decreased activity tolerance, increased muscle spasms, and pain.   ACTIVITY LIMITATIONS: bending and standing  PARTICIPATION LIMITATIONS: laundry  PERSONAL  FACTORS: Time since onset of injury/illness/exacerbation are also affecting patient's functional outcome.   REHAB POTENTIAL: Good  CLINICAL DECISION MAKING: Stable/uncomplicated  EVALUATION COMPLEXITY: Low   GOALS:  SHORT TERM GOALS: Target date: 12/19/23  Ind with a HEP. Goal status: INITIAL  2.  Perform ADL's with pain not > 2-3/10.  Goal status: INITIAL  3.  Stand 20-30 minutes with pain not > 2-3/10.  Goal status: INITIAL  PLAN:  PT FREQUENCY:  2-3 times  a week  PT DURATION: 4 weeks  PLANNED INTERVENTIONS: 97110-Therapeutic exercises, 97530- Therapeutic activity, [id number]- Neuromuscular re-education, 97535- Self Care, [id number]- Manual therapy, 97014- Electrical stimulation (unattended), Patient/Family education, Dry Needling, Cryotherapy, and Moist heat.  PLAN FOR NEXT SESSION: Combo e'stim/US, STW/M, Core exercise progression, spinal protection techniques and body mechanics training.   Granville Lewis, PT 11/28/2023, 12:18 PM

## 2023-11-30 ENCOUNTER — Ambulatory Visit: Payer: Medicare HMO

## 2023-11-30 DIAGNOSIS — M5459 Other low back pain: Secondary | ICD-10-CM | POA: Diagnosis not present

## 2023-11-30 DIAGNOSIS — M62830 Muscle spasm of back: Secondary | ICD-10-CM

## 2023-11-30 NOTE — Therapy (Signed)
OUTPATIENT PHYSICAL THERAPY THORACOLUMBAR TREATMENT   Patient Name: Katherine Ryan MRN: [id number] DOB:14-Mar-1956, 67 y.o., female Today's Date: 11/30/2023  END OF SESSION:  PT End of Session - 11/30/23 1026     Visit Number 3    Number of Visits 12    Date for PT Re-Evaluation 12/19/23    Authorization Type FOTO.    PT Start Time 1023   Patient arrived late to her appointment.   PT Stop Time 1057    PT Time Calculation (min) 34 min    Activity Tolerance Patient tolerated treatment well    Behavior During Therapy WFL for tasks assessed/performed               Past Medical History:  Diagnosis Date   Blindness of right eye with low vision in contralateral eye    Diabetes mellitus without complication (HCC)    GERD (gastroesophageal reflux disease)    Hyperlipidemia    Past Surgical History:  Procedure Laterality Date   BELPHAROPTOSIS REPAIR Right 07/23/2019   CARPAL TUNNEL RELEASE Right    CATARACT PEDIATRIC     right   Patient Active Problem List   Diagnosis Date Noted   Carpal tunnel syndrome of left wrist 12/04/2020   Hypertension associated with diabetes (HCC) 09/07/2016   BMI 33.0-33.9,adult 10/16/2015   Hyperlipidemia associated with type 2 diabetes mellitus (HCC) 02/15/2014   Diabetes (HCC) 08/09/2013   Vitamin D deficiency 05/06/2011   Osteopenia 05/06/2011   Presence of artificial right eye 05/06/2011   GERD (gastroesophageal reflux disease) 05/06/2011   REFERRING PROVIDER: Arlyce Harman DO  REFERRING DIAG: Low back pain.  Rationale for Evaluation and Treatment: Rehabilitation  THERAPY DIAG:  Other low back pain  Muscle spasm of back  ONSET DATE: ~2 years.  SUBJECTIVE:                                                                                                                                                                                           SUBJECTIVE STATEMENT: Patient reports that she did not have any pain since her last  appointment except when she would bend over to do her laundry.   PERTINENT HISTORY:  Osteopenia.  PAIN:  Are you having pain? Yes: NPRS scale: 0/10 Pain location: Left low back/SIJ region. Pain description: Ache. Aggravating factors: As above. Relieving factors: As above.  PRECAUTIONS: None  RED FLAGS: None   WEIGHT BEARING RESTRICTIONS: No  FALLS:  Has patient fallen in last 6 months? No  LIVING ENVIRONMENT: Lives with: lives with their spouse Lives in: House/apartment Has following equipment at home: None  OCCUPATION: Retired.  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  PLOF: Independent  PATIENT GOALS: Get around and do more with less pain.    OBJECTIVE:  Note: Objective measures were completed at Evaluation unless otherwise noted.  POSTURE: No Significant postural limitations  PALPATION: Tender to palpation over left SIJ and proximal gluteal region.  LUMBAR ROM:   Active active lumbar flexion and extension.  LOWER EXTREMITY ROM:     WNL.  LOWER EXTREMITY MMT:   Normal LE strength.  LUMBAR SPECIAL TESTS:  Equal leg lengths. (-) SLR.  Mild pain with left FABER and Sacral Press test.  GAIT: WNL.  TODAY'S TREATMENT:                                                                                                                              DATE:                                    11/30/23 EXERCISE LOG  Exercise Repetitions and Resistance Comments  Nustep  L5 x 15 minutes   Rocker board  5 minutes   Standing HS curl 3 minutes Alternating LE   Isometric ball press  2.5 minutes w/ 5 second hold   Ball roll out  3 minutes  Flexion    Blank cell = exercise not performed today                                    11/28/23 EXERCISE LOG  Exercise Repetitions and Resistance Comments  Nustep L4 x 14 minutes   Lower trunk rotation  20 reps    SLR 20 reps each   Supine clams  Green t-band x 2 minutes   Supine hip ADD isometric  20 reps w/ 5 second hold        Blank cell =  exercise not performed today  Manual Therapy Soft Tissue Mobilization: left gluteals and piriformis, for reduced pain and tone    PATIENT EDUCATION:  Education details: See below. Person educated: Patient Education method: Explanation Education comprehension: verbalized understanding  HOME EXERCISE PROGRAM:  HOME EXERCISE PROGRAM Created by [place] Applegate Nov 25th, 2024 View at [email or web address] using code: [email or web address]  Page 1 of 1 2 Exercises SINGLE KNEE TO CHEST STRETCH - SKTC While Lying on your back, hold your knee and gently pull it up towards your chest. Repeat 3 Times Hold 30 Seconds Complete 1 Set Perform 3 Times a Day Double Knee to chest stretch Pull your knees up to your chest hold for 30 seconds then relax. Repeat Continue to breathe throughout stretch. Repeat 2 Times Hold 30 Seconds Complete 3 Sets Perform 2 Times a Day  ASSESSMENT:  CLINICAL IMPRESSION: Patient was introduced to multiple new standing interventions for improved lumbar mobility and stability. She required minimal cueing with today's new interventions for proper exercise performance.  She experienced no increase in pain or discomfort with any of today's interventions. She reported feeling good upon the conclusion of treatment. She continues to require skilled physical therapy to address her remaining impairments to return to her prior level of function.   OBJECTIVE IMPAIRMENTS: decreased activity tolerance, increased muscle spasms, and pain.   ACTIVITY LIMITATIONS: bending and standing  PARTICIPATION LIMITATIONS: laundry  PERSONAL FACTORS: Time since onset of injury/illness/exacerbation are also affecting patient's functional outcome.   REHAB POTENTIAL: Good  CLINICAL DECISION MAKING: Stable/uncomplicated  EVALUATION COMPLEXITY: Low   GOALS:  SHORT TERM GOALS: Target date: 12/19/23  Ind with a HEP. Goal status: INITIAL  2.  Perform ADL's with pain not > 2-3/10.  Goal status:  INITIAL  3.  Stand 20-30 minutes with pain not > 2-3/10.  Goal status: INITIAL  PLAN:  PT FREQUENCY:  2-3 times a week  PT DURATION: 4 weeks  PLANNED INTERVENTIONS: 97110-Therapeutic exercises, 97530- Therapeutic activity, [id number]- Neuromuscular re-education, 97535- Self Care, 56213- Manual therapy, 97014- Electrical stimulation (unattended), Patient/Family education, Dry Needling, Cryotherapy, and Moist heat.  PLAN FOR NEXT SESSION: Combo e'stim/US, STW/M, Core exercise progression, spinal protection techniques and body mechanics training.   Granville Lewis, PT 11/30/2023, 12:39 PM

## 2023-12-02 ENCOUNTER — Encounter: Payer: Medicare HMO | Admitting: *Deleted

## 2023-12-05 ENCOUNTER — Ambulatory Visit: Payer: Medicare HMO | Admitting: Physical Therapy

## 2023-12-05 ENCOUNTER — Encounter: Payer: Self-pay | Admitting: Physical Therapy

## 2023-12-05 DIAGNOSIS — M5459 Other low back pain: Secondary | ICD-10-CM

## 2023-12-05 DIAGNOSIS — M62830 Muscle spasm of back: Secondary | ICD-10-CM | POA: Diagnosis not present

## 2023-12-05 NOTE — Therapy (Signed)
OUTPATIENT PHYSICAL THERAPY THORACOLUMBAR TREATMENT   Patient Name: Katherine Ryan MRN: [id number] DOB:Mar 17, 1956, 67 y.o., female Today's Date: 12/05/2023  END OF SESSION:  PT End of Session - 12/05/23 1035     Visit Number 4    Date for PT Re-Evaluation 12/19/23    Authorization Type FOTO.    PT Start Time 1017    PT Stop Time 1100    PT Time Calculation (min) 43 min    Activity Tolerance Patient tolerated treatment well    Behavior During Therapy WFL for tasks assessed/performed               Past Medical History:  Diagnosis Date   Blindness of right eye with low vision in contralateral eye    Diabetes mellitus without complication (HCC)    GERD (gastroesophageal reflux disease)    Hyperlipidemia    Past Surgical History:  Procedure Laterality Date   BELPHAROPTOSIS REPAIR Right 07/23/2019   CARPAL TUNNEL RELEASE Right    CATARACT PEDIATRIC     right   Patient Active Problem List   Diagnosis Date Noted   Carpal tunnel syndrome of left wrist 12/04/2020   Hypertension associated with diabetes (HCC) 09/07/2016   BMI 33.0-33.9,adult 10/16/2015   Hyperlipidemia associated with type 2 diabetes mellitus (HCC) 02/15/2014   Diabetes (HCC) 08/09/2013   Vitamin D deficiency 05/06/2011   Osteopenia 05/06/2011   Presence of artificial right eye 05/06/2011   GERD (gastroesophageal reflux disease) 05/06/2011   REFERRING PROVIDER: Arlyce Harman DO  REFERRING DIAG: Low back pain.  Rationale for Evaluation and Treatment: Rehabilitation  THERAPY DIAG:  Other low back pain  Muscle spasm of back  ONSET DATE: ~2 years.  SUBJECTIVE:                                                                                                                                                                                           SUBJECTIVE STATEMENT:Pain around a 3.  Getting an injection Wednesday.  Patient reports that she did not have any pain since her last appointment except  when she would bend over to do her laundry.   PERTINENT HISTORY:  Osteopenia.  PAIN:  Are you having pain? Yes: NPRS scale: 3/10 Pain location: Left low back/SIJ region. Pain description: Ache. Aggravating factors: As above. Relieving factors: As above.  PRECAUTIONS: None  RED FLAGS: None   WEIGHT BEARING RESTRICTIONS: No  FALLS:  Has patient fallen in last 6 months? 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  PLOF: Independent  PATIENT GOALS: Get around and do more with less pain.    OBJECTIVE:  Note: Objective measures were completed at Evaluation unless otherwise noted.  POSTURE: No Significant postural limitations  PALPATION: Tender to palpation over left SIJ and proximal gluteal region.  LUMBAR ROM:   Active active lumbar flexion and extension.  LOWER EXTREMITY ROM:     WNL.  LOWER EXTREMITY MMT:   Normal LE strength.  LUMBAR SPECIAL TESTS:  Equal leg lengths. (-) SLR.  Mild pain with left FABER and Sacral Press test.  GAIT: WNL.  TODAY'S TREATMENT:                                                                                                                              DATE:   12/05/23:                                     EXERCISE LOG  Exercise Repetitions and Resistance Comments  Nustep Level 4 x 15 minutes   Back extension 60# x 4 minutes with draw-in   Ab curls 60# x 4 minutes with draw-in   Ham curls 30# x 4 minutes.        Prone over pillow:  STW/M x 11 minutes to patient's left SIJ region.                                    11/30/23 EXERCISE LOG  Exercise Repetitions and Resistance Comments  Nustep  L5 x 15 minutes   Rocker board  5 minutes   Standing HS curl 3 minutes Alternating LE   Isometric ball press  2.5 minutes w/ 5 second hold   Ball roll out  3 minutes  Flexion    Blank cell = exercise not  performed today                                    11/28/23 EXERCISE LOG  Exercise Repetitions and Resistance Comments  Nustep L4 x 14 minutes   Lower trunk rotation  20 reps    SLR 20 reps each   Supine clams  Green t-band x 2 minutes   Supine hip ADD isometric  20 reps w/ 5 second hold        Blank cell = exercise not performed today  Manual Therapy Soft Tissue Mobilization: left gluteals and piriformis, for reduced pain and tone    PATIENT EDUCATION:  Education details: See below. Person educated: Patient Education method: Explanation Education comprehension: verbalized understanding  HOME EXERCISE PROGRAM:  HOME EXERCISE PROGRAM Created by [place] Cyanne Delmar Nov 25th, 2024 View at [email or web address] using code: [email or web address]  Page 1 of 1 2 Exercises SINGLE KNEE TO CHEST STRETCH - SKTC While Lying  on your back, hold your knee and gently pull it up towards your chest. Repeat 3 Times Hold 30 Seconds Complete 1 Set Perform 3 Times a Day Double Knee to chest stretch Pull your knees up to your chest hold for 30 seconds then relax. Repeat Continue to breathe throughout stretch. Repeat 2 Times Hold 30 Seconds Complete 3 Sets Perform 2 Times a Day  ASSESSMENT:  CLINICAL IMPRESSION: Patient is highly motivated and did well with treatment today.  She remains palpably tender over her left SIJ and had a good response to STW/M.  OBJECTIVE IMPAIRMENTS: decreased activity tolerance, increased muscle spasms, and pain.   ACTIVITY LIMITATIONS: bending and standing  PARTICIPATION LIMITATIONS: laundry  PERSONAL FACTORS: Time since onset of injury/illness/exacerbation are also affecting patient's functional outcome.   REHAB POTENTIAL: Good  CLINICAL DECISION MAKING: Stable/uncomplicated  EVALUATION COMPLEXITY: Low   GOALS:  SHORT TERM GOALS: Target date: 12/19/23  Ind with a HEP. Goal status: INITIAL  2.  Perform ADL's with pain not > 2-3/10.  Goal status: INITIAL  3.   Stand 20-30 minutes with pain not > 2-3/10.  Goal status: INITIAL  PLAN:  PT FREQUENCY:  2-3 times a week  PT DURATION: 4 weeks  PLANNED INTERVENTIONS: 97110-Therapeutic exercises, 97530- Therapeutic activity, [id number]- Neuromuscular re-education, 97535- Self Care, [id number]- Manual therapy, 97014- Electrical stimulation (unattended), Patient/Family education, Dry Needling, Cryotherapy, and Moist heat.  PLAN FOR NEXT SESSION: Combo e'stim/US, STW/M, Core exercise progression, spinal protection techniques and body mechanics training.   Keiland Pickering, [place], PT 12/05/2023, 11:25 AM

## 2023-12-07 DIAGNOSIS — M533 Sacrococcygeal disorders, not elsewhere classified: Secondary | ICD-10-CM | POA: Diagnosis not present

## 2023-12-12 ENCOUNTER — Encounter: Payer: Self-pay | Admitting: *Deleted

## 2023-12-12 ENCOUNTER — Ambulatory Visit: Payer: Medicare HMO | Admitting: *Deleted

## 2023-12-12 DIAGNOSIS — M62830 Muscle spasm of back: Secondary | ICD-10-CM | POA: Diagnosis not present

## 2023-12-12 DIAGNOSIS — M5459 Other low back pain: Secondary | ICD-10-CM | POA: Diagnosis not present

## 2023-12-12 NOTE — Therapy (Signed)
OUTPATIENT PHYSICAL THERAPY THORACOLUMBAR TREATMENT   Patient Name: Katherine Ryan MRN: [id number] DOB:[birth year], 67 y.o., female Today's Date: 12/12/2023  END OF SESSION:  PT End of Session - 12/12/23 1021     Visit Number 5    Number of Visits 12    Date for PT Re-Evaluation 12/19/23    Authorization Type FOTO.    PT Start Time 1017    PT Stop Time 1105    PT Time Calculation (min) 48 min               Past Medical History:  Diagnosis Date   Blindness of right eye with low vision in contralateral eye    Diabetes mellitus without complication (HCC)    GERD (gastroesophageal reflux disease)    Hyperlipidemia    Past Surgical History:  Procedure Laterality Date   BELPHAROPTOSIS REPAIR Right 07/23/2019   CARPAL TUNNEL RELEASE Right    CATARACT PEDIATRIC     right   Patient Active Problem List   Diagnosis Date Noted   Carpal tunnel syndrome of left wrist 12/04/2020   Hypertension associated with diabetes (HCC) 09/07/2016   BMI 33.0-33.9,adult 10/16/2015   Hyperlipidemia associated with type 2 diabetes mellitus (HCC) 02/15/2014   Diabetes (HCC) 08/09/2013   Vitamin D deficiency 05/06/2011   Osteopenia 05/06/2011   Presence of artificial right eye 05/06/2011   GERD (gastroesophageal reflux disease) 05/06/2011   REFERRING PROVIDER: Arlyce Harman DO  REFERRING DIAG: Low back pain.  Rationale for Evaluation and Treatment: Rehabilitation  THERAPY DIAG:  Other low back pain  Muscle spasm of back  ONSET DATE: ~2 years.  SUBJECTIVE:                                                                                                                                                                                           SUBJECTIVE STATEMENT:  Patient reports having an injection and pain is less now 2-3/10 LT SIJ   PERTINENT HISTORY:  Osteopenia.  PAIN:  Are you having pain? Yes: NPRS scale: 3/10 Pain location: Left low back/SIJ region. Pain description:  Ache. Aggravating factors: As above. Relieving factors: As above.  PRECAUTIONS: None  RED FLAGS: None   WEIGHT BEARING RESTRICTIONS: No  FALLS:  Has patient fallen in last 6 months? 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  PLOF: Independent  PATIENT GOALS: Get around and do more with less pain.    OBJECTIVE:  Note: Objective measures were completed at Evaluation unless otherwise noted.  POSTURE: No Significant postural  limitations  PALPATION: Tender to palpation over left SIJ and proximal gluteal region.  LUMBAR ROM:   Active active lumbar flexion and extension.  LOWER EXTREMITY ROM:     WNL.  LOWER EXTREMITY MMT:   Normal LE strength.  LUMBAR SPECIAL TESTS:  Equal leg lengths. (-) SLR.  Mild pain with left FABER and Sacral Press test.  GAIT: WNL.  TODAY'S TREATMENT:                                                                                                                              DATE:   12/12/23:                                     EXERCISE LOG   LT SIJ  Exercise Repetitions and Resistance Comments  Nustep Level 4 x 15 minutes   Back extension 60# x 4 minutes    Ab curls 60# x 4 minutes    Ham curls         Prone over pillow:  STW/M x 13 minutes to patient's left SIJ region and TPR LT glute prone                                    11/30/23 EXERCISE LOG  Exercise Repetitions and Resistance Comments  Nustep  L5 x 15 minutes   Rocker board  5 minutes   Standing HS curl 3 minutes Alternating LE   Isometric ball press  2.5 minutes w/ 5 second hold   Ball roll out  3 minutes  Flexion    Blank cell = exercise not performed today                                    11/28/23 EXERCISE LOG  Exercise Repetitions and Resistance Comments  Nustep L4 x 14 minutes   Lower trunk rotation  20 reps    SLR 20 reps each    Supine clams  Green t-band x 2 minutes   Supine hip ADD isometric  20 reps w/ 5 second hold        Blank cell = exercise not performed today  Manual Therapy Soft Tissue Mobilization: left gluteals and piriformis, for reduced pain and tone    PATIENT EDUCATION:  Education details: See below. Person educated: Patient Education method: Explanation Education comprehension: verbalized understanding  HOME EXERCISE PROGRAM:  HOME EXERCISE PROGRAM Created by [place] [employer] Nov 25th, 2024 View at [email or web address] using code: [email or web address]  Page 1 of 1 2 Exercises SINGLE KNEE TO CHEST STRETCH - SKTC While Lying on your back, hold your knee and gently pull it up towards your chest. Repeat 3 Times Hold 30 Seconds Complete 1 Set Perform 3 Times  a Day Double Knee to chest stretch Pull your knees up to your chest hold for 30 seconds then relax. Repeat Continue to breathe throughout stretch. Repeat 2 Times Hold 30 Seconds Complete 3 Sets Perform 2 Times a Day  ASSESSMENT:  CLINICAL IMPRESSION:FOTO Patient arrived today doing fairly well with decreased pain LT SIJ. She was able to continue with core exs without increased pain. Reduced soreness SIJ, but with increased LT glute and TP. Good release LT glute with TPR.     OBJECTIVE IMPAIRMENTS: decreased activity tolerance, increased muscle spasms, and pain.   ACTIVITY LIMITATIONS: bending and standing  PARTICIPATION LIMITATIONS: laundry  PERSONAL FACTORS: Time since onset of injury/illness/exacerbation are also affecting patient's functional outcome.   REHAB POTENTIAL: Good  CLINICAL DECISION MAKING: Stable/uncomplicated  EVALUATION COMPLEXITY: Low   GOALS:  SHORT TERM GOALS: Target date: 12/19/23  Ind with a HEP. Goal status: On going  2.  Perform ADL's with pain not > 2-3/10.  Goal status: ON going  3.  Stand 20-30 minutes with pain not > 2-3/10.  Goal status: On going  PLAN:  PT FREQUENCY:  2-3 times a  week  PT DURATION: 4 weeks  PLANNED INTERVENTIONS: 97110-Therapeutic exercises, 97530- Therapeutic activity, [id number]- Neuromuscular re-education, 97535- Self Care, 16109- Manual therapy, 97014- Electrical stimulation (unattended), Patient/Family education, Dry Needling, Cryotherapy, and Moist heat.  PLAN FOR NEXT SESSION: Combo e'stim/US, STW/M, Core exercise progression, spinal protection techniques and body mechanics training.   Jatavia Keltner,CHRIS, PTA 12/12/2023, 1:11 PM

## 2023-12-14 ENCOUNTER — Ambulatory Visit: Payer: Medicare HMO

## 2023-12-14 DIAGNOSIS — M62830 Muscle spasm of back: Secondary | ICD-10-CM | POA: Diagnosis not present

## 2023-12-14 DIAGNOSIS — M5459 Other low back pain: Secondary | ICD-10-CM

## 2023-12-14 NOTE — Therapy (Signed)
OUTPATIENT PHYSICAL THERAPY THORACOLUMBAR TREATMENT   Patient Name: Katherine Ryan MRN: [id number] DOB:03/14/1956, 67 y.o., female Today's Date: 12/14/2023  END OF SESSION:  PT End of Session - 12/14/23 1027     Visit Number 6    Number of Visits 12    Date for PT Re-Evaluation 12/19/23    Authorization Type FOTO.    PT Start Time 1015    PT Stop Time 1100    PT Time Calculation (min) 45 min    Activity Tolerance Patient tolerated treatment well    Behavior During Therapy WFL for tasks assessed/performed                Past Medical History:  Diagnosis Date   Blindness of right eye with low vision in contralateral eye    Diabetes mellitus without complication (HCC)    GERD (gastroesophageal reflux disease)    Hyperlipidemia    Past Surgical History:  Procedure Laterality Date   BELPHAROPTOSIS REPAIR Right 07/23/2019   CARPAL TUNNEL RELEASE Right    CATARACT PEDIATRIC     right   Patient Active Problem List   Diagnosis Date Noted   Carpal tunnel syndrome of left wrist 12/04/2020   Hypertension associated with diabetes (HCC) 09/07/2016   BMI 33.0-33.9,adult 10/16/2015   Hyperlipidemia associated with type 2 diabetes mellitus (HCC) 02/15/2014   Diabetes (HCC) 08/09/2013   Vitamin D deficiency 05/06/2011   Osteopenia 05/06/2011   Presence of artificial right eye 05/06/2011   GERD (gastroesophageal reflux disease) 05/06/2011   REFERRING PROVIDER: Arlyce Harman DO  REFERRING DIAG: Low back pain.  Rationale for Evaluation and Treatment: Rehabilitation  THERAPY DIAG:  Other low back pain  Muscle spasm of back  ONSET DATE: ~2 years.  SUBJECTIVE:                                                                                                                                                                                           SUBJECTIVE STATEMENT: Patient reports that she feels better today.    PERTINENT HISTORY:  Osteopenia.  PAIN:  Are you  having pain? Yes: NPRS scale: 2-3/10 Pain location: Left low back/SIJ region. Pain description: Ache. Aggravating factors: As above. Relieving factors: As above.  PRECAUTIONS: None  RED FLAGS: None   WEIGHT BEARING RESTRICTIONS: No  FALLS:  Has patient fallen in last 6 months? 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  PLOF: Independent  PATIENT GOALS: Get around and do more with less pain.    OBJECTIVE:  Note:  Objective measures were completed at Evaluation unless otherwise noted.  POSTURE: No Significant postural limitations  PALPATION: Tender to palpation over left SIJ and proximal gluteal region.  LUMBAR ROM:   Active active lumbar flexion and extension.  LOWER EXTREMITY ROM:     WNL.  LOWER EXTREMITY MMT:   Normal LE strength.  LUMBAR SPECIAL TESTS:  Equal leg lengths. (-) SLR.  Mild pain with left FABER and Sacral Press test.  GAIT: WNL.  TODAY'S TREATMENT:                                                                                                                              DATE:                                   12/14/23  EXERCISE LOG  Exercise Repetitions and Resistance Comments  Nustep  L4 x 15 minutes   Cybex back extension  60# x 4 minutes    Cybex back flexion 60# x 4 minutes   Standing HS stretch  3 x 30 seconds each    Rocker board  5 minutes   Runners stretch  2 x 30 seconds each     Blank cell = exercise not performed today   12/12/23: EXERCISE LOG   LT SIJ  Exercise Repetitions and Resistance Comments  Nustep Level 4 x 15 minutes   Back extension 60# x 4 minutes    Ab curls 60# x 4 minutes    Ham curls         Prone over pillow:  STW/M x 13 minutes to patient's left SIJ region and TPR LT glute prone                                    11/30/23 EXERCISE LOG  Exercise Repetitions and Resistance  Comments  Nustep  L5 x 15 minutes   Rocker board  5 minutes   Standing HS curl 3 minutes Alternating LE   Isometric ball press  2.5 minutes w/ 5 second hold   Ball roll out  3 minutes  Flexion    Blank cell = exercise not performed today   PATIENT EDUCATION:  Education details: HEP, exercise classes, and exercise progression Person educated: Patient Education method: Explanation Education comprehension: verbalized understanding  HOME EXERCISE PROGRAM:  HOME EXERCISE PROGRAM Created by [place] [employer] Nov 25th, 2024 View at [email or web address] using code: [email or web address]  Page 1 of 1 2 Exercises SINGLE KNEE TO CHEST STRETCH - SKTC While Lying on your back, hold your knee and gently pull it up towards your chest. Repeat 3 Times Hold 30 Seconds Complete 1 Set Perform 3 Times a Day Double Knee to chest stretch Pull your knees up to your chest hold for 30 seconds then relax. Repeat Continue to breathe throughout  stretch. Repeat 2 Times Hold 30 Seconds Complete 3 Sets Perform 2 Times a Day  ASSESSMENT:  CLINICAL IMPRESSION: Today's treatment focused on familiar interventions for improved lumbar strength and lower extremity mobility to reduce her symptoms. She was educated on how to safely reintroduce her exercise classes to return to her prior level of function. She reported understanding. She experienced no increase in pain or discomfort with any of today's interventions. She reported feeling better upon the conclusion of treatment. She continues to require skilled physical therapy to address her remaining impairments to return to her prior level of function.   OBJECTIVE IMPAIRMENTS: decreased activity tolerance, increased muscle spasms, and pain.   ACTIVITY LIMITATIONS: bending and standing  PARTICIPATION LIMITATIONS: laundry  PERSONAL FACTORS: Time since onset of injury/illness/exacerbation are also affecting patient's functional outcome.   REHAB POTENTIAL: Good  CLINICAL  DECISION MAKING: Stable/uncomplicated  EVALUATION COMPLEXITY: Low   GOALS:  SHORT TERM GOALS: Target date: 12/19/23  Ind with a HEP. Goal status: On going  2.  Perform ADL's with pain not > 2-3/10.  Goal status: ON going  3.  Stand 20-30 minutes with pain not > 2-3/10.  Goal status: On going  PLAN:  PT FREQUENCY:  2-3 times a week  PT DURATION: 4 weeks  PLANNED INTERVENTIONS: 97110-Therapeutic exercises, 97530- Therapeutic activity, [id number]- Neuromuscular re-education, 97535- Self Care, 78469- Manual therapy, 97014- Electrical stimulation (unattended), Patient/Family education, Dry Needling, Cryotherapy, and Moist heat.  PLAN FOR NEXT SESSION: Combo e'stim/US, STW/M, Core exercise progression, spinal protection techniques and body mechanics training.   Granville Lewis, PT 12/14/2023, 12:47 PM

## 2023-12-22 ENCOUNTER — Ambulatory Visit: Payer: Medicare HMO

## 2023-12-22 DIAGNOSIS — M5459 Other low back pain: Secondary | ICD-10-CM

## 2023-12-22 DIAGNOSIS — M62830 Muscle spasm of back: Secondary | ICD-10-CM | POA: Diagnosis not present

## 2023-12-22 NOTE — Therapy (Signed)
OUTPATIENT PHYSICAL THERAPY THORACOLUMBAR TREATMENT   Patient Name: Katherine Ryan MRN: [id number] DOB:23-Jul-1956, 67 y.o., female Today's Date: 12/22/2023  END OF SESSION:  PT End of Session - 12/22/23 1019     Visit Number 7    Number of Visits 12    Date for PT Re-Evaluation 12/19/23    Authorization Type FOTO.    PT Start Time 1015    PT Stop Time 1055    PT Time Calculation (min) 40 min    Activity Tolerance Patient tolerated treatment well    Behavior During Therapy WFL for tasks assessed/performed                 Past Medical History:  Diagnosis Date   Blindness of right eye with low vision in contralateral eye    Diabetes mellitus without complication (HCC)    GERD (gastroesophageal reflux disease)    Hyperlipidemia    Past Surgical History:  Procedure Laterality Date   BELPHAROPTOSIS REPAIR Right 07/23/2019   CARPAL TUNNEL RELEASE Right    CATARACT PEDIATRIC     right   Patient Active Problem List   Diagnosis Date Noted   Carpal tunnel syndrome of left wrist 12/04/2020   Hypertension associated with diabetes (HCC) 09/07/2016   BMI 33.0-33.9,adult 10/16/2015   Hyperlipidemia associated with type 2 diabetes mellitus (HCC) 02/15/2014   Diabetes (HCC) 08/09/2013   Vitamin D deficiency 05/06/2011   Osteopenia 05/06/2011   Presence of artificial right eye 05/06/2011   GERD (gastroesophageal reflux disease) 05/06/2011   REFERRING PROVIDER: Arlyce Harman DO  REFERRING DIAG: Low back pain.  Rationale for Evaluation and Treatment: Rehabilitation  THERAPY DIAG:  Other low back pain  Muscle spasm of back  ONSET DATE: ~2 years.  SUBJECTIVE:                                                                                                                                                                                           SUBJECTIVE STATEMENT: Patient reports that she is not hurting today, but this is the first day that she has not hurt since  she went to exercise last Thursday. She notes that it started on Friday morning, but it got worse as she had to do more standing up.   PERTINENT HISTORY:  Osteopenia.  PAIN:  Are you having pain? Yes: NPRS scale: 0/10 Pain location: Left low back/SIJ region. Pain description: Ache. Aggravating factors: As above. Relieving factors: As above.  PRECAUTIONS: None  RED FLAGS: None   WEIGHT BEARING RESTRICTIONS: No  FALLS:  Has patient fallen in last 6 months? No  LIVING ENVIRONMENT: Lives with: lives  with their spouse Lives in: House/apartment Has following equipment at home: None  OCCUPATION: Retired.  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  PLOF: Independent  PATIENT GOALS: Get around and do more with less pain.   NEXT MD FOLLOW UP: 12/26/23  OBJECTIVE:  Note: Objective measures were completed at Evaluation unless otherwise noted.  POSTURE: No Significant postural limitations  PALPATION: Tender to palpation over left SIJ and proximal gluteal region.  LUMBAR ROM:   Active active lumbar flexion and extension.  LOWER EXTREMITY ROM:     WNL.  LOWER EXTREMITY MMT:   Normal LE strength.  LUMBAR SPECIAL TESTS:  Equal leg lengths. (-) SLR.  Mild pain with left FABER and Sacral Press test.  GAIT: WNL.  TODAY'S TREATMENT:                                                                                                                              DATE:                                    12/22/23 EXERCISE LOG  Exercise Repetitions and Resistance Comments  Nustep  L5 x 15.5 minutes   Resisted pull down  Blue XTS x 2 minutes   Cybex knee flexion  30# x 2 minutes   Standing gastroc stretch  4 x 30 seconds   Tandem stance on foam  4 x 30 seconds each     Blank cell = exercise not performed today                                   12/14/23  EXERCISE LOG  Exercise Repetitions and Resistance Comments  Nustep  L4 x 15 minutes   Cybex back extension  60# x 4 minutes    Cybex back  flexion 60# x 4 minutes   Standing HS stretch  3 x 30 seconds each    Rocker board  5 minutes   Runners stretch  2 x 30 seconds each     Blank cell = exercise not performed today   12/12/23: EXERCISE LOG   LT SIJ  Exercise Repetitions and Resistance Comments  Nustep Level 4 x 15 minutes   Back extension 60# x 4 minutes    Ab curls 60# x 4 minutes    Ham curls         Prone over pillow:  STW/M x 13 minutes to patient's left SIJ region and TPR LT glute prone  PATIENT EDUCATION:  Education details: HEP, exercise classes, and exercise progression Person educated: Patient Education method: Explanation Education comprehension: verbalized understanding  HOME EXERCISE PROGRAM:  HOME EXERCISE PROGRAM Created by [place] [employer] Nov 25th, 2024 View at [email or web address] using code: [email or web address]  Page 1 of 1 2 Exercises SINGLE KNEE TO CHEST STRETCH - SKTC While Lying on your  back, hold your knee and gently pull it up towards your chest. Repeat 3 Times Hold 30 Seconds Complete 1 Set Perform 3 Times a Day Double Knee to chest stretch Pull your knees up to your chest hold for 30 seconds then relax. Repeat Continue to breathe throughout stretch. Repeat 2 Times Hold 30 Seconds Complete 3 Sets Perform 2 Times a Day  ASSESSMENT:  CLINICAL IMPRESSION: Patient was progressed with familiar interventions for improved lumbar muscular endurance and strength with moderate difficulty and fatigue. She required minimal cueing with today's interventions for proper exercise performance. She experienced mild increase in "muscle burn" with today's resisted and weighted interventions, but this did not limit her ability to complete any of today's interventions. She reported that she was not hurting upon the conclusion of treatment. She continues to require skilled physical therapy to address his remaining impairments to return to her prior level of function.   OBJECTIVE IMPAIRMENTS: decreased activity  tolerance, increased muscle spasms, and pain.   ACTIVITY LIMITATIONS: bending and standing  PARTICIPATION LIMITATIONS: laundry  PERSONAL FACTORS: Time since onset of injury/illness/exacerbation are also affecting patient's functional outcome.   REHAB POTENTIAL: Good  CLINICAL DECISION MAKING: Stable/uncomplicated  EVALUATION COMPLEXITY: Low   GOALS:  SHORT TERM GOALS: Target date: 12/19/23  Ind with a HEP. Goal status: On going  2.  Perform ADL's with pain not > 2-3/10.  Goal status: ON going  3.  Stand 20-30 minutes with pain not > 2-3/10.  Goal status: On going  PLAN:  PT FREQUENCY:  2-3 times a week  PT DURATION: 4 weeks  PLANNED INTERVENTIONS: 97110-Therapeutic exercises, 97530- Therapeutic activity, [id number]- Neuromuscular re-education, 97535- Self Care, 16109- Manual therapy, 97014- Electrical stimulation (unattended), Patient/Family education, Dry Needling, Cryotherapy, and Moist heat.  PLAN FOR NEXT SESSION: Combo e'stim/US, STW/M, Core exercise progression, spinal protection techniques and body mechanics training.   Granville Lewis, PT 12/22/2023, 12:17 PM

## 2023-12-23 DIAGNOSIS — H5202 Hypermetropia, left eye: Secondary | ICD-10-CM | POA: Diagnosis not present

## 2023-12-23 DIAGNOSIS — H43812 Vitreous degeneration, left eye: Secondary | ICD-10-CM | POA: Diagnosis not present

## 2023-12-23 DIAGNOSIS — H52222 Regular astigmatism, left eye: Secondary | ICD-10-CM | POA: Diagnosis not present

## 2023-12-23 DIAGNOSIS — H2512 Age-related nuclear cataract, left eye: Secondary | ICD-10-CM | POA: Diagnosis not present

## 2023-12-23 DIAGNOSIS — E119 Type 2 diabetes mellitus without complications: Secondary | ICD-10-CM | POA: Diagnosis not present

## 2023-12-23 DIAGNOSIS — H524 Presbyopia: Secondary | ICD-10-CM | POA: Diagnosis not present

## 2023-12-26 DIAGNOSIS — M545 Low back pain, unspecified: Secondary | ICD-10-CM | POA: Diagnosis not present

## 2023-12-26 DIAGNOSIS — M533 Sacrococcygeal disorders, not elsewhere classified: Secondary | ICD-10-CM | POA: Diagnosis not present

## 2023-12-26 DIAGNOSIS — M47816 Spondylosis without myelopathy or radiculopathy, lumbar region: Secondary | ICD-10-CM | POA: Diagnosis not present

## 2023-12-27 ENCOUNTER — Ambulatory Visit: Payer: Medicare HMO

## 2023-12-27 DIAGNOSIS — M5459 Other low back pain: Secondary | ICD-10-CM | POA: Diagnosis not present

## 2023-12-27 DIAGNOSIS — M62830 Muscle spasm of back: Secondary | ICD-10-CM | POA: Diagnosis not present

## 2023-12-27 NOTE — Therapy (Signed)
 OUTPATIENT PHYSICAL THERAPY THORACOLUMBAR TREATMENT   Patient Name: Katherine Ryan MRN: [id number] DOB:1956-08-25, 67 y.o., female Today's Date: 12/27/2023  END OF SESSION:  PT End of Session - 12/27/23 1021     Visit Number 8    Number of Visits 12    Date for PT Re-Evaluation 12/19/23    Authorization Type FOTO.    PT Start Time 1015    PT Stop Time 1058    PT Time Calculation (min) 43 min    Activity Tolerance Patient tolerated treatment well    Behavior During Therapy WFL for tasks assessed/performed                  Past Medical History:  Diagnosis Date   Blindness of right eye with low vision in contralateral eye    Diabetes mellitus without complication (HCC)    GERD (gastroesophageal reflux disease)    Hyperlipidemia    Past Surgical History:  Procedure Laterality Date   BELPHAROPTOSIS REPAIR Right 07/23/2019   CARPAL TUNNEL RELEASE Right    CATARACT PEDIATRIC     right   Patient Active Problem List   Diagnosis Date Noted   Carpal tunnel syndrome of left wrist 12/04/2020   Hypertension associated with diabetes (HCC) 09/07/2016   BMI 33.0-33.9,adult 10/16/2015   Hyperlipidemia associated with type 2 diabetes mellitus (HCC) 02/15/2014   Diabetes (HCC) 08/09/2013   Vitamin D  deficiency 05/06/2011   Osteopenia 05/06/2011   Presence of artificial right eye 05/06/2011   GERD (gastroesophageal reflux disease) 05/06/2011   REFERRING PROVIDER: Evalene Citron DO  REFERRING DIAG: Low back pain.  Rationale for Evaluation and Treatment: Rehabilitation  THERAPY DIAG:  Other low back pain  Muscle spasm of back  ONSET DATE: ~2 years.  SUBJECTIVE:                                                                                                                                                                                           SUBJECTIVE STATEMENT: Patient reports that she is not hurting today, but she realized that bending over aggravates her  pain. She saw her physician yesterday and she was given a muscle relaxer.   PERTINENT HISTORY:  Osteopenia.  PAIN:  Are you having pain? Yes: NPRS scale: 0/10 Pain location: Left low back/SIJ region. Pain description: Ache. Aggravating factors: As above. Relieving factors: As above.  PRECAUTIONS: None  RED FLAGS: None   WEIGHT BEARING RESTRICTIONS: No  FALLS:  Has patient fallen in last 6 months? 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  PLOF: Independent  PATIENT GOALS: Get around and do more with less pain.   NEXT MD FOLLOW UP: 12/26/23  OBJECTIVE:  Note: Objective measures were completed at Evaluation unless otherwise noted.  POSTURE: No Significant postural limitations  PALPATION: Tender to palpation over left SIJ and proximal gluteal region.  LUMBAR ROM:   Active active lumbar flexion and extension.  LOWER EXTREMITY ROM:     WNL.  LOWER EXTREMITY MMT:   Normal LE strength.  LUMBAR SPECIAL TESTS:  Equal leg lengths. (-) SLR.  Mild pain with left FABER and Sacral Press test.  GAIT: WNL.  TODAY'S TREATMENT:                                                                                                                              DATE:                                    12/27/23 EXERCISE LOG  Exercise Repetitions and Resistance Comments  Nustep  L3-5 x 15.5 minutes   Rocker board  4 minutes   Isometric ball press 2.5 minutes w/ 5 second hold   Ball roll out  3 minutes  For lumbar flexion   Cybex knee flexion  40# x 2 minutes   Standing heel raise 3 minutes w/ 10 second hold    Blank cell = exercise not performed today                                    12/22/23 EXERCISE LOG  Exercise Repetitions and Resistance Comments  Nustep  L5 x 15.5 minutes   Resisted pull down  Blue XTS x 2 minutes   Cybex knee flexion  30#  x 2 minutes   Standing gastroc stretch  4 x 30 seconds   Tandem stance on foam  4 x 30 seconds each     Blank cell = exercise not performed today                                   12/14/23  EXERCISE LOG  Exercise Repetitions and Resistance Comments  Nustep  L4 x 15 minutes   Cybex back extension  60# x 4 minutes    Cybex back flexion 60# x 4 minutes   Standing HS stretch  3 x 30 seconds each    Rocker board  5 minutes   Runners stretch  2 x 30 seconds each     Blank cell = exercise not performed today   PATIENT EDUCATION:  Education details: HEP, exercise classes, and exercise progression Person educated: Patient Education method: Explanation Education comprehension: verbalized understanding  HOME EXERCISE PROGRAM:  HOME EXERCISE PROGRAM Created by Chad Applegate  Nov 25th, 2024 View at [email or web address] using code: [email or web address]  Page 1 of 1 2 Exercises SINGLE KNEE TO CHEST STRETCH - SKTC While Lying on your back, hold your knee and gently pull it up towards your chest. Repeat 3 Times Hold 30 Seconds Complete 1 Set Perform 3 Times a Day Double Knee to chest stretch Pull your knees up to your chest hold for 30 seconds then relax. Repeat Continue to breathe throughout stretch. Repeat 2 Times Hold 30 Seconds Complete 3 Sets Perform 2 Times a Day  ASSESSMENT:  CLINICAL IMPRESSION: Patient was progressed with familiar interventions for improved lumbar and lower extremity strength with moderate difficulty. She required minimal cueing with today's interventions for proper exercise performance. She experienced no increase in pain or discomfort with any of today's interventions. She reported feeling good upon the conclusion of treatment. She continues to require skilled physical therapy to address her remaining impairments to return to her prior level of function.    OBJECTIVE IMPAIRMENTS: decreased activity tolerance, increased muscle spasms, and pain.   ACTIVITY LIMITATIONS:  bending and standing  PARTICIPATION LIMITATIONS: laundry  PERSONAL FACTORS: Time since onset of injury/illness/exacerbation are also affecting patient's functional outcome.   REHAB POTENTIAL: Good  CLINICAL DECISION MAKING: Stable/uncomplicated  EVALUATION COMPLEXITY: Low   GOALS:  SHORT TERM GOALS: Target date: 12/19/23  Ind with a HEP. Goal status: On going  2.  Perform ADL's with pain not > 2-3/10.  Goal status: ON going  3.  Stand 20-30 minutes with pain not > 2-3/10.  Goal status: MET  PLAN:  PT FREQUENCY:  2-3 times a week  PT DURATION: 4 weeks  PLANNED INTERVENTIONS: 97110-Therapeutic exercises, 97530- Therapeutic activity, [id number]- Neuromuscular re-education, 97535- Self Care, [id number]- Manual therapy, 97014- Electrical stimulation (unattended), Patient/Family education, Dry Needling, Cryotherapy, and Moist heat.  PLAN FOR NEXT SESSION: Combo e'stim/US , STW/M, Core exercise progression, spinal protection techniques and body mechanics training.   Lacinda JAYSON Fass, PT 12/27/2023, 11:15 AM

## 2024-01-02 ENCOUNTER — Encounter: Payer: Medicare HMO | Admitting: Physical Therapy

## 2024-01-03 DIAGNOSIS — B351 Tinea unguium: Secondary | ICD-10-CM | POA: Diagnosis not present

## 2024-01-03 DIAGNOSIS — L84 Corns and callosities: Secondary | ICD-10-CM | POA: Diagnosis not present

## 2024-01-03 DIAGNOSIS — M79676 Pain in unspecified toe(s): Secondary | ICD-10-CM | POA: Diagnosis not present

## 2024-01-03 DIAGNOSIS — E1142 Type 2 diabetes mellitus with diabetic polyneuropathy: Secondary | ICD-10-CM | POA: Diagnosis not present

## 2024-01-09 ENCOUNTER — Ambulatory Visit: Payer: Medicare HMO

## 2024-01-09 VITALS — Ht 62.0 in | Wt 202.0 lb

## 2024-01-09 DIAGNOSIS — Z0000 Encounter for general adult medical examination without abnormal findings: Secondary | ICD-10-CM | POA: Diagnosis not present

## 2024-01-09 NOTE — Patient Instructions (Signed)
 Katherine Ryan , Thank you for taking time to come for your Medicare Wellness Visit. I appreciate your ongoing commitment to your health goals. Please review the following plan we discussed and let me know if I can assist you in the future.   Referrals/Orders/Follow-Ups/Clinician Recommendations: Aim for 30 minutes of exercise or brisk walking, 6-8 glasses of water, and 5 servings of fruits and vegetables each day.  This is a list of the screening recommended for you and due dates:  Health Maintenance  Topic Date Due   Colon Cancer Screening  Never done   Screening for Lung Cancer  Never done   Stool Blood Test  03/22/2018   COVID-19 Vaccine (3 - 2024-25 season) 08/28/2023   Complete foot exam   10/15/2023   Eye exam for diabetics  12/16/2023   Zoster (Shingles) Vaccine (1 of 2) 01/27/2024*   Flu Shot  03/26/2024*   DTaP/Tdap/Td vaccine (2 - Tdap) 10/26/2024*   Pneumonia Vaccine (1 of 2 - PCV) 10/26/2024*   Mammogram  10/26/2024*   DEXA scan (bone density measurement)  10/26/2024*   Hemoglobin A1C  04/25/2024   Yearly kidney function blood test for diabetes  10/26/2024   Yearly kidney health urinalysis for diabetes  10/26/2024   Medicare Annual Wellness Visit  01/08/2025   Hepatitis C Screening  Completed   HPV Vaccine  Aged Out  *Topic was postponed. The date shown is not the original due date.    Advanced directives: (ACP Link)Information on Advanced Care Planning can be found at Teresita  Secretary of [facility] Advance Health Care Directives Advance Health Care Directives ([email or web address]   Next Medicare Annual Wellness Visit scheduled for next year: Yes

## 2024-01-09 NOTE — Progress Notes (Signed)
 Subjective:   Katherine Ryan is a 68 y.o. female who presents for Medicare Annual (Subsequent) preventive examination.  Visit Complete: Virtual I connected with  Katherine Ryan on 01/09/24 by a audio enabled telemedicine application and verified that I am speaking with the correct person using two identifiers.  Patient Location: Home  Provider Location: Home Office  This patient declined Interactive audio and video telecommunications. Therefore the visit was completed with audio only.  I discussed the limitations of evaluation and management by telemedicine. The patient expressed understanding and agreed to proceed.  Vital Signs: Because this visit was a virtual/telehealth visit, some criteria may be missing or patient reported. Any vitals not documented were not able to be obtained and vitals that have been documented are patient reported.  Cardiac Risk Factors include: advanced age (>43men, >35 women);diabetes mellitus;hypertension;smoking/ tobacco exposure;sedentary lifestyle;dyslipidemia     Objective:    Today's Vitals   01/09/24 1446  Weight: 202 lb (91.6 kg)  Height: 5' 2 (1.575 m)   Body mass index is 36.95 kg/m.     01/09/2024    2:51 PM 11/21/2023   10:40 AM 02/02/2023   12:57 PM 01/06/2023   10:36 AM 10/19/2022    8:45 AM  Advanced Directives  Does Patient Have a Medical Advance Directive? No No No No No  Would patient like information on creating a medical advance directive? Yes (MAU/Ambulatory/Procedural Areas - Information given)   No - Patient declined     Current Medications (verified) Outpatient Encounter Medications as of 01/09/2024  Medication Sig   amLODipine  (NORVASC ) 5 MG tablet Take 1 tablet (5 mg total) by mouth daily.   Calcium  Carbonate (CALCIUM  500 PO) Take 1 tablet by mouth daily in the afternoon.   Capsicum, Cayenne, (CAYENNE PO) Take 1 capsule by mouth daily in the afternoon.   Celecoxib  (CELEBREX  PO) Take by mouth.   Cholecalciferol  (VITAMIN D ) 50 MCG (2000 UT) CAPS Take 1 capsule (2,000 Units total) by mouth daily.   Emollient (COLLAGEN EX) Take 2 Scoops by mouth daily.   Evolocumab  (REPATHA  SURECLICK) 140 MG/ML SOAJ Inject 140 mg into the skin every 14 (fourteen) days.   losartan  (COZAAR ) 100 MG tablet Take 1 tablet (100 mg total) by mouth daily.   Magnesium  250 MG TABS Take 1 tablet by mouth daily in the afternoon.   metFORMIN  (GLUCOPHAGE ) 1000 MG tablet Take 1 tablet (1,000 mg total) by mouth 2 (two) times daily with a meal.   No facility-administered encounter medications on file as of 01/09/2024.    Allergies (verified) Asa arthritis strength-antacid [aspirin buffered], Ace inhibitors, Atorvastatin , Lisinopril , and Nsaids   History: Past Medical History:  Diagnosis Date   Blindness of right eye with low vision in contralateral eye    Diabetes mellitus without complication (HCC)    GERD (gastroesophageal reflux disease)    Hyperlipidemia    Past Surgical History:  Procedure Laterality Date   BELPHAROPTOSIS REPAIR Right 07/23/2019   CARPAL TUNNEL RELEASE Right    CATARACT PEDIATRIC     right   Family History  Problem Relation Age of Onset   Diabetes Mother    Hypertension Mother    Asthma Father    Arthritis Father    Social History   Socioeconomic History   Marital status: Married    Spouse name: Not on file   Number of children: 2   Years of education: Not on file   Highest education level: Not on file  Occupational History  Occupation: Set Designer  Tobacco Use   Smoking status: Former    Current packs/day: 0.00    Average packs/day: 1.5 packs/day for 35.0 years (52.5 ttl pk-yrs)    Types: Cigarettes    Start date: 10/12/1976    Quit date: 10/13/2011    Years since quitting: 12.2   Smokeless tobacco: Never  Vaping Use   Vaping status: Never Used  Substance and Sexual Activity   Alcohol use: No    Alcohol/week: 0.0 standard drinks of alcohol   Drug use: No   Sexual activity: Not  on file  Other Topics Concern   Not on file  Social History Narrative   Right handed    Lives with husband and cares for him   Social Drivers of Health   Financial Resource Strain: Low Risk  (01/09/2024)   Overall Financial Resource Strain (CARDIA)    Difficulty of Paying Living Expenses: Not hard at all  Food Insecurity: No Food Insecurity (01/09/2024)   Hunger Vital Sign    Worried About Running Out of Food in the Last Year: Never true    Ran Out of Food in the Last Year: Never true  Transportation Needs: No Transportation Needs (01/09/2024)   PRAPARE - Administrator, Civil Service (Medical): No    Lack of Transportation (Non-Medical): No  Physical Activity: Insufficiently Active (01/09/2024)   Exercise Vital Sign    Days of Exercise per Week: 3 days    Minutes of Exercise per Session: 30 min  Stress: No Stress Concern Present (01/09/2024)   [employer] of Occupational Health - Occupational Stress Questionnaire    Feeling of Stress : Not at all  Social Connections: Moderately Integrated (01/09/2024)   Social Connection and Isolation Panel [NHANES]    Frequency of Communication with Friends and Family: More than three times a week    Frequency of Social Gatherings with Friends and Family: Three times a week    Attends Religious Services: More than 4 times per year    Active Member of Clubs or Organizations: No    Attends Banker Meetings: Never    Marital Status: Married    Tobacco Counseling Counseling given: Not Answered   Clinical Intake:  Pre-visit preparation completed: Yes  Pain : No/denies pain     Diabetes: Yes CBG done?: No Did pt. bring in CBG monitor from home?: No  How often do you need to have someone help you when you read instructions, pamphlets, or other written materials from your doctor or pharmacy?: 1 - Never  Interpreter Needed?: No  Information entered by :: Charmaine Bloodgood LPN   Activities of Daily Living     01/09/2024    2:51 PM  In your present state of health, do you have any difficulty performing the following activities:  Hearing? 0  Vision? 0  Difficulty concentrating or making decisions? 0  Walking or climbing stairs? 0  Dressing or bathing? 0  Doing errands, shopping? 0  Preparing Food and eating ? N  Using the Toilet? N  In the past six months, have you accidently leaked urine? N  Do you have problems with loss of bowel control? N  Managing your Medications? N  Managing your Finances? N  Housekeeping or managing your Housekeeping? N    Patient Care Team: Gladis Mustard, FNP as PCP - General (Nurse Practitioner) Alvan Dorn FALCON, MD as PCP - Cardiology (Cardiology) Georjean Darice HERO, MD as Consulting Physician (Neurology) [facility] Roddie Bring, DPM as Consulting Physician (Podiatry)  Indicate any recent Medical Services you may have received from other than [facility] providers in the past year (date may be approximate).     Assessment:   This is a routine wellness examination for Denisha.  Hearing/Vision screen Hearing Screening - Comments:: Denies hearing difficulties   Vision Screening - Comments:: Wears rx glasses - up to date with routine eye exams with [facility]    Goals Addressed             This Visit's Progress    Remain active and independent        Depression Screen    01/09/2024    2:50 PM 10/27/2023    8:21 AM 07/12/2023    8:18 AM 04/04/2023    8:32 AM 01/14/2023   11:05 AM 01/06/2023   10:34 AM 10/14/2022   11:22 AM  PHQ 2/9 Scores  PHQ - 2 Score 0 0 1 0 0 0 0  PHQ- 9 Score  6 6 5 4  4     Fall Risk    01/09/2024    2:50 PM 10/27/2023    8:21 AM 07/12/2023    8:18 AM 04/04/2023    8:32 AM 02/02/2023   12:57 PM  Fall Risk   Falls in the past year? 0 0 0 0 0  Number falls in past yr: 0    0  Injury with Fall? 0    0  Risk for fall due to : No Fall Risks      Follow up Falls prevention  discussed;Education provided;Falls evaluation completed    Falls evaluation completed    MEDICARE RISK AT HOME: Medicare Risk at Home Any stairs in or around the home?: No If so, are there any without handrails?: No Home free of loose throw rugs in walkways, pet beds, electrical cords, etc?: Yes Adequate lighting in your home to reduce risk of falls?: Yes Life alert?: No Use of a cane, walker or w/c?: No Grab bars in the bathroom?: Yes Shower chair or bench in shower?: No Elevated toilet seat or a handicapped toilet?: Yes  TIMED UP AND GO:  Was the test performed?  No    Cognitive Function:        01/09/2024    2:51 PM 01/06/2023   10:36 AM  6CIT Screen  What Year? 0 points 0 points  What month? 0 points 0 points  What time? 0 points 0 points  Count back from 20 2 points 0 points  Months in reverse 0 points 0 points  Repeat phrase 0 points 0 points  Total Score 2 points 0 points    Immunizations Immunization History  Administered Date(s) Administered   DTaP 02/14/2008   PFIZER(Purple Top)SARS-COV-2 Vaccination 03/20/2020, 04/12/2020    TDAP status: Due, Education has been provided regarding the importance of this vaccine. Advised may receive this vaccine at local pharmacy or Health Dept. Aware to provide a copy of the vaccination record if obtained from local pharmacy or Health Dept. Verbalized acceptance and understanding.  Flu Vaccine status: Declined, Education has been provided regarding the importance of this vaccine but patient still declined. Advised may receive this vaccine at local pharmacy or Health Dept. Aware to provide a copy of the vaccination record if obtained from local pharmacy or Health Dept. Verbalized acceptance and understanding.  Pneumococcal vaccine status: Declined,  Education has been provided regarding the importance of this vaccine but patient still declined. Advised may receive  this vaccine at local pharmacy or Health Dept. Aware to provide a  copy of the vaccination record if obtained from local pharmacy or Health Dept. Verbalized acceptance and understanding.   Covid-19 vaccine status: Declined, Education has been provided regarding the importance of this vaccine but patient still declined. Advised may receive this vaccine at local pharmacy or Health Dept.or vaccine clinic. Aware to provide a copy of the vaccination record if obtained from local pharmacy or Health Dept. Verbalized acceptance and understanding.  Qualifies for Shingles Vaccine? Yes   Zostavax completed No   Shingrix Completed?: No.    Education has been provided regarding the importance of this vaccine. Patient has been advised to call insurance company to determine out of pocket expense if they have not yet received this vaccine. Advised may also receive vaccine at local pharmacy or Health Dept. Verbalized acceptance and understanding.  Screening Tests Health Maintenance  Topic Date Due   Colonoscopy  Never done   Lung Cancer Screening  Never done   COLON CANCER SCREENING ANNUAL FOBT  03/22/2018   COVID-19 Vaccine (3 - 2024-25 season) 08/28/2023   FOOT EXAM  10/15/2023   OPHTHALMOLOGY EXAM  12/16/2023   Zoster Vaccines- Shingrix (1 of 2) 01/27/2024 (Originally 09/28/2006)   INFLUENZA VACCINE  03/26/2024 (Originally 07/28/2023)   DTaP/Tdap/Td (2 - Tdap) 10/26/2024 (Originally 02/13/2018)   Pneumonia Vaccine 5+ Years old (1 of 2 - PCV) 10/26/2024 (Originally 09/28/1962)   MAMMOGRAM  10/26/2024 (Originally 01/28/2023)   DEXA SCAN  10/26/2024 (Originally 03/31/2023)   HEMOGLOBIN A1C  04/25/2024   Diabetic kidney evaluation - eGFR measurement  10/26/2024   Diabetic kidney evaluation - Urine ACR  10/26/2024   Medicare Annual Wellness (AWV)  01/08/2025   Hepatitis C Screening  Completed   HPV VACCINES  Aged Out    Health Maintenance  Health Maintenance Due  Topic Date Due   Colonoscopy  Never done   Lung Cancer Screening  Never done   COLON CANCER SCREENING ANNUAL  FOBT  03/22/2018   COVID-19 Vaccine (3 - 2024-25 season) 08/28/2023   FOOT EXAM  10/15/2023   OPHTHALMOLOGY EXAM  12/16/2023    Colorectal cancer screening:  Patient declines at this time   Mammogram status:  Patient has done with outside provider;  States that she will schedule soon   Bone Density status: Completed 03/30/21. Results reflect: Bone density results: OSTEOPENIA. Repeat every 2-3 years.  Lung Cancer Screening: (Low Dose CT Chest recommended if Age 74-80 years, 20 pack-year currently smoking OR have quit w/in 15years.) does qualify.   Lung Cancer Screening Referral: Patient declines at this time   Additional Screening:  Hepatitis C Screening: does qualify; Completed 02/10/16  Vision Screening: Recommended annual ophthalmology exams for early detection of glaucoma and other disorders of the eye. Is the patient up to date with their annual eye exam?  Yes  Who is the provider or what is the name of the office in which the patient attends annual eye exams? [facility] If pt is not established with a provider, would they like to be referred to a provider to establish care? No .   Dental Screening: Recommended annual dental exams for proper oral hygiene  Diabetic Foot Exam: Diabetic Foot Exam: Overdue, Pt has been advised about the importance in completing this exam. Pt is scheduled for diabetic foot exam on at next office visit.  Community Resource Referral / Chronic Care Management: CRR required this visit?  No   CCM required this visit?  No     Plan:     I have personally reviewed and noted the following in the patient's chart:   Medical and social history Use of alcohol, tobacco or illicit drugs  Current medications and supplements including opioid prescriptions. Patient is not currently taking opioid prescriptions. Functional ability and status Nutritional status Physical activity Advanced directives List of other physicians Hospitalizations, surgeries,  and ER visits in previous 12 months Vitals Screenings to include cognitive, depression, and falls Referrals and appointments  In addition, I have reviewed and discussed with patient certain preventive protocols, quality metrics, and best practice recommendations. A written personalized care plan for preventive services as well as general preventive health recommendations were provided to patient.     Lavelle Pfeiffer [place], [place]   8/86/7974   After Visit Summary: (Mail) Due to this being a telephonic visit, the after visit summary with patients personalized plan was offered to patient via mail   Nurse Notes: No concerns at this time

## 2024-02-03 ENCOUNTER — Ambulatory Visit (INDEPENDENT_AMBULATORY_CARE_PROVIDER_SITE_OTHER): Payer: Medicare HMO | Admitting: Nurse Practitioner

## 2024-02-03 ENCOUNTER — Encounter: Payer: Self-pay | Admitting: Nurse Practitioner

## 2024-02-03 VITALS — BP 130/71 | HR 71 | Temp 97.6°F | Wt 199.8 lb

## 2024-02-03 DIAGNOSIS — Z7984 Long term (current) use of oral hypoglycemic drugs: Secondary | ICD-10-CM | POA: Diagnosis not present

## 2024-02-03 DIAGNOSIS — E785 Hyperlipidemia, unspecified: Secondary | ICD-10-CM

## 2024-02-03 DIAGNOSIS — E559 Vitamin D deficiency, unspecified: Secondary | ICD-10-CM | POA: Diagnosis not present

## 2024-02-03 DIAGNOSIS — E1159 Type 2 diabetes mellitus with other circulatory complications: Secondary | ICD-10-CM

## 2024-02-03 DIAGNOSIS — E119 Type 2 diabetes mellitus without complications: Secondary | ICD-10-CM | POA: Diagnosis not present

## 2024-02-03 DIAGNOSIS — E1169 Type 2 diabetes mellitus with other specified complication: Secondary | ICD-10-CM | POA: Diagnosis not present

## 2024-02-03 DIAGNOSIS — I152 Hypertension secondary to endocrine disorders: Secondary | ICD-10-CM | POA: Diagnosis not present

## 2024-02-03 LAB — LIPID PANEL: Chol/HDL Ratio: 4 {ratio} (ref 0.0–4.4)

## 2024-02-03 LAB — BAYER DCA HB A1C WAIVED: HB A1C (BAYER DCA - WAIVED): 7.7 % — ABNORMAL HIGH (ref 4.8–5.6)

## 2024-02-03 MED ORDER — LOSARTAN POTASSIUM 100 MG PO TABS: 100.0000 mg | ORAL_TABLET | Freq: Every day | ORAL | 1 refills | Status: DC

## 2024-02-03 MED ORDER — METFORMIN HCL 1000 MG PO TABS: 1000.0000 mg | ORAL_TABLET | Freq: Two times a day (BID) | ORAL | 1 refills | Status: DC

## 2024-02-03 MED ORDER — VITAMIN D 50 MCG (2000 UT) PO CAPS: 1.0000 | ORAL_CAPSULE | Freq: Every day | ORAL | 1 refills | Status: AC

## 2024-02-03 MED ORDER — AMLODIPINE BESYLATE 5 MG PO TABS: 5.0000 mg | ORAL_TABLET | Freq: Every day | ORAL | 1 refills | Status: DC

## 2024-02-03 NOTE — Patient Instructions (Signed)
 Back Exercises These exercises help to make your trunk and back strong. They also help to keep the lower back flexible. Doing these exercises can help to prevent or lessen pain in your lower back. If you have back pain, try to do these exercises 2-3 times each day or as told by your doctor. As you get better, do the exercises once each day. Repeat the exercises more often as told by your doctor. To stop back pain from coming back, do the exercises once each day, or as told by your doctor. Do exercises exactly as told by your doctor. Stop right away if you feel sudden pain or your pain gets worse. Exercises Single knee to chest Do these steps 3-5 times in a row for each leg: Lie on your back on a firm bed or the floor with your legs stretched out. Bring one knee to your chest. Grab your knee or thigh with both hands and hold it in place. Pull on your knee until you feel a gentle stretch in your lower back or butt. Keep doing the stretch for 10-30 seconds. Slowly let go of your leg and straighten it. Pelvic tilt Do these steps 5-10 times in a row: Lie on your back on a firm bed or the floor with your legs stretched out. Bend your knees so they point up to the ceiling. Your feet should be flat on the floor. Tighten your lower belly (abdomen) muscles to press your lower back against the floor. This will make your tailbone point up to the ceiling instead of pointing down to your feet or the floor. Stay in this position for 5-10 seconds while you gently tighten your muscles and breathe evenly. Cat-cow Do these steps until your lower back bends more easily: Get on your hands and knees on a firm bed or the floor. Keep your hands under your shoulders, and keep your knees under your hips. You may put padding under your knees. Let your head hang down toward your chest. Tighten (contract) the muscles in your belly. Point your tailbone toward the floor so your lower back becomes rounded like the back of a  cat. Stay in this position for 5 seconds. Slowly lift your head. Let the muscles of your belly relax. Point your tailbone up toward the ceiling so your back forms a sagging arch like the back of a cow. Stay in this position for 5 seconds.  Press-ups Do these steps 5-10 times in a row: Lie on your belly (face-down) on a firm bed or the floor. Place your hands near your head, about shoulder-width apart. While you keep your back relaxed and keep your hips on the floor, slowly straighten your arms to raise the top half of your body and lift your shoulders. Do not use your back muscles. You may change where you place your hands to make yourself more comfortable. Stay in this position for 5 seconds. Keep your back relaxed. Slowly return to lying flat on the floor.  Bridges Do these steps 10 times in a row: Lie on your back on a firm bed or the floor. Bend your knees so they point up to the ceiling. Your feet should be flat on the floor. Your arms should be flat at your sides, next to your body. Tighten your butt muscles and lift your butt off the floor until your waist is almost as high as your knees. If you do not feel the muscles working in your butt and the back of  your thighs, slide your feet 1-2 inches (2.5-5 cm) farther away from your butt. Stay in this position for 3-5 seconds. Slowly lower your butt to the floor, and let your butt muscles relax. If this exercise is too easy, try doing it with your arms crossed over your chest. Belly crunches Do these steps 5-10 times in a row: Lie on your back on a firm bed or the floor with your legs stretched out. Bend your knees so they point up to the ceiling. Your feet should be flat on the floor. Cross your arms over your chest. Tip your chin a little bit toward your chest, but do not bend your neck. Tighten your belly muscles and slowly raise your chest just enough to lift your shoulder blades a tiny bit off the floor. Avoid raising your body  higher than that because it can put too much stress on your lower back. Slowly lower your chest and your head to the floor. Back lifts Do these steps 5-10 times in a row: Lie on your belly (face-down) with your arms at your sides, and rest your forehead on the floor. Tighten the muscles in your legs and your butt. Slowly lift your chest off the floor while you keep your hips on the floor. Keep the back of your head in line with the curve in your back. Look at the floor while you do this. Stay in this position for 3-5 seconds. Slowly lower your chest and your face to the floor. Contact a doctor if: Your back pain gets a lot worse when you do an exercise. Your back pain does not get better within 2 hours after you exercise. If you have any of these problems, stop doing the exercises. Do not do them again unless your doctor says it is okay. Get help right away if: You have sudden, very bad back pain. If this happens, stop doing the exercises. Do not do them again unless your doctor says it is okay. This information is not intended to replace advice given to you by your health care provider. Make sure you discuss any questions you have with your health care provider. Document Revised: 02/25/2021 Document Reviewed: 02/25/2021 Elsevier Patient Education  2024 [employer].

## 2024-02-03 NOTE — Progress Notes (Signed)
 Subjective:    Patient ID: Katherine Ryan, female    DOB: [birth year], 68 y.o.   MRN: [id number]   Chief Complaint: medical management of chronic issues     HPI:  Katherine Ryan is a 68 y.o. who identifies as a female who was assigned female at birth.   Social history: Lives with: husband Work history: retired   Water Engineer in today for follow up of the following chronic medical issues:  1. Hypertension associated with diabetes (HCC) No c/o chest pain, sob or headache. Does not check blood pressure at home except occasionally. BP Readings from Last 3 Encounters:  10/27/23 134/76  07/12/23 119/75  04/04/23 132/71     2. Hyperlipidemia associated with type 2 diabetes mellitus (HCC) Does try to watch diet. Does no dedictaed exercise. Lab Results  Component Value Date   CHOL 246 (H) 10/27/2023   HDL 67 10/27/2023   LDLCALC 151 (H) 10/27/2023   TRIG 160 (H) 10/27/2023   CHOLHDL 3.7 10/27/2023     3. Type 2 diabetes mellitus without complication, without long-term current use of insulin (HCC) Fasting blood sugars are running around 150's usually Lab Results  Component Value Date   HGBA1C 7.8 (H) 10/27/2023  '  4. Gastroesophageal reflux disease without esophagitis Uses OTC meds if needed  5. Vitamin D  deficiency Is on vitamin d  supplement daily  6. Osteopenia of lumbar spine Last dexascan was done on 03/30/21. T score was -1.4  7. BMI 33.0-33.9,adult Weight is down 3 lbs  Wt Readings from Last 3 Encounters:  02/03/24 199 lb 12.8 oz (90.6 kg)  01/09/24 202 lb (91.6 kg)  10/27/23 202 lb (91.6 kg)   BMI Readings from Last 3 Encounters:  02/03/24 36.54 kg/m  01/09/24 36.95 kg/m  10/27/23 36.95 kg/m       New complaints: None today  Allergies  Allergen Reactions   Asa Arthritis Strength-Antacid [Aspirin Buffered] Anaphylaxis    GI upset   Ace Inhibitors Cough   Atorvastatin  Other (See Comments)   Lisinopril  Cough   Nsaids    Outpatient Encounter  Medications as of 02/03/2024  Medication Sig   amLODipine  (NORVASC ) 5 MG tablet Take 1 tablet (5 mg total) by mouth daily.   Calcium  Carbonate (CALCIUM  500 PO) Take 1 tablet by mouth daily in the afternoon.   Capsicum, Cayenne, (CAYENNE PO) Take 1 capsule by mouth daily in the afternoon.   Celecoxib  (CELEBREX  PO) Take by mouth.   Cholecalciferol (VITAMIN D ) 50 MCG (2000 UT) CAPS Take 1 capsule (2,000 Units total) by mouth daily.   Emollient (COLLAGEN EX) Take 2 Scoops by mouth daily.   Evolocumab  (REPATHA  SURECLICK) 140 MG/ML SOAJ Inject 140 mg into the skin every 14 (fourteen) days.   losartan  (COZAAR ) 100 MG tablet Take 1 tablet (100 mg total) by mouth daily.   Magnesium  250 MG TABS Take 1 tablet by mouth daily in the afternoon.   metFORMIN  (GLUCOPHAGE ) 1000 MG tablet Take 1 tablet (1,000 mg total) by mouth 2 (two) times daily with a meal.   No facility-administered encounter medications on file as of 02/03/2024.    Past Surgical History:  Procedure Laterality Date   BELPHAROPTOSIS REPAIR Right 07/23/2019   CARPAL TUNNEL RELEASE Right    CATARACT PEDIATRIC     right    Family History  Problem Relation Age of Onset   Diabetes Mother    Hypertension Mother    Asthma Father    Arthritis Father  Controlled substance contract: n/a     Review of Systems  Constitutional:  Negative for diaphoresis.  Eyes:  Negative for pain.  Respiratory:  Negative for shortness of breath.   Cardiovascular:  Negative for chest pain, palpitations and leg swelling.  Gastrointestinal:  Negative for abdominal pain.  Endocrine: Negative for polydipsia.  Skin:  Negative for rash.  Neurological:  Negative for dizziness, weakness and headaches.  Hematological:  Does not bruise/bleed easily.  All other systems reviewed and are negative.      Objective:   Physical Exam Vitals and nursing note reviewed.  Constitutional:      General: She is not in acute distress.    Appearance: Normal  appearance. She is well-developed.  HENT:     Head: Normocephalic.     Right Ear: Tympanic membrane normal.     Left Ear: Tympanic membrane normal.     Nose: Nose normal.     Mouth/Throat:     Mouth: Mucous membranes are moist.  Eyes:     Pupils: Pupils are equal, round, and reactive to light.  Neck:     Vascular: No carotid bruit or JVD.  Cardiovascular:     Rate and Rhythm: Normal rate and regular rhythm.     Heart sounds: Normal heart sounds.  Pulmonary:     Effort: Pulmonary effort is normal. No respiratory distress.     Breath sounds: Normal breath sounds. No wheezing or rales.  Chest:     Chest wall: No tenderness.  Abdominal:     General: Bowel sounds are normal. There is no distension or abdominal bruit.     Palpations: Abdomen is soft. There is no hepatomegaly, splenomegaly, mass or pulsatile mass.     Tenderness: There is no abdominal tenderness.  Musculoskeletal:        General: Normal range of motion.     Cervical back: Normal range of motion and neck supple.     Right lower leg: Edema (1+) present.     Left lower leg: Edema (1+) present.  Lymphadenopathy:     Cervical: No cervical adenopathy.  Skin:    General: Skin is warm and dry.  Neurological:     Mental Status: She is alert and oriented to person, place, and time.     Deep Tendon Reflexes: Reflexes are normal and symmetric.  Psychiatric:        Behavior: Behavior normal.        Thought Content: Thought content normal.        Judgment: Judgment normal.     BP 130/71   Pulse 71   Temp 97.6 F (36.4 C)   Wt 199 lb 12.8 oz (90.6 kg)   LMP 04/04/2005   SpO2 95%   BMI 36.54 kg/m   LMP 04/04/2005   Hgba1c 7.7%    Assessment & Plan:   Momo Hoskinson comes in today with chief complaint of No chief complaint on file.   Diagnosis and orders addressed:  1. Hypertension associated with diabetes (HCC) Low sodium diet - CBC with Differential/Platelet - CMP14+EGFR - amLODipine  (NORVASC ) 5 MG  tablet; Take 1 tablet (5 mg total) by mouth daily.  Dispense: 90 tablet; Refill: 1 - losartan  (COZAAR ) 100 MG tablet; Take 1 tablet (100 mg total) by mouth daily.  Dispense: 90 tablet; Refill: 1  2. Hyperlipidemia associated with type 2 diabetes mellitus (HCC) Low fat diet - Lipid panel - Evolocumab  (REPATHA  SURECLICK) 140 MG/ML SOAJ; Inject 140 mg into the skin every 14 (  fourteen) days.  Dispense: 6 mL; Refill: 4  3. Type 2 diabetes mellitus without complication, without long-term current use of insulin (HCC) Stricter carb counting Increase metformin  1000mg  bid Keep diary of blood sugars - Bayer DCA Hb A1c Waived - Microalbumin / creatinine urine ratio - metFORMIN  (GLUCOPHAGE ) 500 MG tablet; Take 1 tablet (500 mg total) by mouth daily with breakfast.  Dispense: 90 tablet; Refill: 1  4. Gastroesophageal reflux disease without esophagitis Avoid spicy foods Do not eat 2 hours prior to bedtime   5. Vitamin D  deficiency Continue vitamin d  supplement - Cholecalciferol (VITAMIN D ) 50 MCG (2000 UT) CAPS; Take 1 capsule (2,000 Units total) by mouth daily.  Dispense: 90 capsule; Refill: 1  6. Osteopenia of lumbar spine Weight bearing exrcises  7. BMI 33.0-33.9,adult Discussed diet and exercise for person with BMI >25 Will recheck weight in 3-6 months    Labs pending Health Maintenance reviewed Diet and exercise encouraged  Follow up plan: 3 months   Mary-Margaret Gladis, FNP

## 2024-02-04 LAB — CBC WITH DIFFERENTIAL/PLATELET
Basophils Absolute: 0 10*3/uL (ref 0.0–0.2)
Basos: 0 %
EOS (ABSOLUTE): 0.1 10*3/uL (ref 0.0–0.4)
Eos: 2 %
Hematocrit: 42.1 % (ref 34.0–46.6)
Hemoglobin: 13.9 g/dL (ref 11.1–15.9)
Immature Grans (Abs): 0 10*3/uL (ref 0.0–0.1)
Immature Granulocytes: 0 %
Lymphocytes Absolute: 1.3 10*3/uL (ref 0.7–3.1)
Lymphs: 21 %
MCH: 29.4 pg (ref 26.6–33.0)
MCHC: 33 g/dL (ref 31.5–35.7)
MCV: 89 fL (ref 79–97)
Monocytes Absolute: 0.7 10*3/uL (ref 0.1–0.9)
Monocytes: 11 %
Neutrophils Absolute: 4.2 10*3/uL (ref 1.4–7.0)
Neutrophils: 66 %
Platelets: 265 10*3/uL (ref 150–450)
RBC: 4.73 x10E6/uL (ref 3.77–5.28)
RDW: 13 % (ref 11.7–15.4)
WBC: 6.4 10*3/uL (ref 3.4–10.8)

## 2024-02-04 LAB — CMP14+EGFR
ALT: 25 [IU]/L (ref 0–32)
AST: 22 [IU]/L (ref 0–40)
Albumin: 4.3 g/dL (ref 3.9–4.9)
Alkaline Phosphatase: 86 [IU]/L (ref 44–121)
BUN/Creatinine Ratio: 17 (ref 12–28)
BUN: 15 mg/dL (ref 8–27)
Bilirubin Total: 0.2 mg/dL (ref 0.0–1.2)
CO2: 24 mmol/L (ref 20–29)
Calcium: 9.7 mg/dL (ref 8.7–10.3)
Chloride: 99 mmol/L (ref 96–106)
Creatinine, Ser: 0.88 mg/dL (ref 0.57–1.00)
Globulin, Total: 3 g/dL (ref 1.5–4.5)
Glucose: 151 mg/dL — ABNORMAL HIGH (ref 70–99)
Potassium: 4.5 mmol/L (ref 3.5–5.2)
Sodium: 137 mmol/L (ref 134–144)
Total Protein: 7.3 g/dL (ref 6.0–8.5)
eGFR: 72 mL/min/{1.73_m2} (ref >59)

## 2024-02-04 LAB — LIPID PANEL
Cholesterol, Total: 234 mg/dL — ABNORMAL HIGH (ref 100–199)
HDL: 58 mg/dL (ref >39)
LDL CALC COMMENT:: 4 ratio (ref 0.0–4.4)
LDL Chol Calc (NIH): 151 mg/dL — ABNORMAL HIGH (ref 0–99)
Triglycerides: 138 mg/dL (ref 0–149)
VLDL Cholesterol Cal: 25 mg/dL (ref 5–40)

## 2024-02-14 DIAGNOSIS — Z1231 Encounter for screening mammogram for malignant neoplasm of breast: Secondary | ICD-10-CM | POA: Diagnosis not present

## 2024-02-14 LAB — HM MAMMOGRAPHY

## 2024-03-06 ENCOUNTER — Encounter: Payer: Self-pay | Admitting: Nurse Practitioner

## 2024-03-13 DIAGNOSIS — M545 Low back pain, unspecified: Secondary | ICD-10-CM | POA: Diagnosis not present

## 2024-03-13 DIAGNOSIS — R42 Dizziness and giddiness: Secondary | ICD-10-CM | POA: Diagnosis not present

## 2024-03-13 DIAGNOSIS — M549 Dorsalgia, unspecified: Secondary | ICD-10-CM | POA: Diagnosis not present

## 2024-03-13 DIAGNOSIS — I10 Essential (primary) hypertension: Secondary | ICD-10-CM | POA: Diagnosis not present

## 2024-03-13 DIAGNOSIS — M47816 Spondylosis without myelopathy or radiculopathy, lumbar region: Secondary | ICD-10-CM | POA: Diagnosis not present

## 2024-03-13 DIAGNOSIS — M533 Sacrococcygeal disorders, not elsewhere classified: Secondary | ICD-10-CM | POA: Diagnosis not present

## 2024-03-22 DIAGNOSIS — B351 Tinea unguium: Secondary | ICD-10-CM | POA: Diagnosis not present

## 2024-03-22 DIAGNOSIS — L84 Corns and callosities: Secondary | ICD-10-CM | POA: Diagnosis not present

## 2024-03-22 DIAGNOSIS — M79676 Pain in unspecified toe(s): Secondary | ICD-10-CM | POA: Diagnosis not present

## 2024-03-22 DIAGNOSIS — E1142 Type 2 diabetes mellitus with diabetic polyneuropathy: Secondary | ICD-10-CM | POA: Diagnosis not present

## 2024-04-04 DIAGNOSIS — M545 Low back pain, unspecified: Secondary | ICD-10-CM | POA: Diagnosis not present

## 2024-04-11 DIAGNOSIS — M545 Low back pain, unspecified: Secondary | ICD-10-CM | POA: Diagnosis not present

## 2024-04-24 ENCOUNTER — Encounter: Payer: Self-pay | Admitting: Cardiology

## 2024-04-24 ENCOUNTER — Ambulatory Visit: Payer: Medicare HMO | Attending: Cardiology | Admitting: Cardiology

## 2024-04-24 VITALS — BP 150/90 | HR 79 | Ht 62.0 in | Wt 200.0 lb

## 2024-04-24 DIAGNOSIS — E785 Hyperlipidemia, unspecified: Secondary | ICD-10-CM | POA: Diagnosis not present

## 2024-04-24 DIAGNOSIS — R42 Dizziness and giddiness: Secondary | ICD-10-CM | POA: Diagnosis not present

## 2024-04-24 DIAGNOSIS — I10 Essential (primary) hypertension: Secondary | ICD-10-CM

## 2024-04-24 NOTE — Progress Notes (Signed)
 Clinical Summary Katherine Ryan is a 68 y.o.female  1.Lightheadedness  - symptoms started in October - episodes tend to occur in the AM.  - about 1.5-2 hrs after waking up   -lightheaded feeling comes while sitting. During episodes vision get blurry. Trouble word finding. Mild heaviness in back of head. Gets better with getting up and becoming active in the house. Often comes on with playing games on phone    -extensive evaluation by vascular, neuro. No clear etiology - some verterbal disease on CTA but from notes not thought to be etiology     2. HTN - home bp's 102-140s/70s-80s, can vary depending on activities.      3. Hyperlipidemia - side effects on lipitor, zetia , now on crestor  5mg  once a week.   - last visit referred to lipid clinic - Jan 2024 TC 185 TG 150 HDL 68 LDL 91  01/2024 TC 234 TG 138 HDL 58 LDL 151 - she stopped repatha , reported caused her blood sugars to increase - she is not interested in trying leqvio.   Past Medical History:  Diagnosis Date   Arthritis    Blindness of right eye with low vision in contralateral eye    Diabetes mellitus without complication (HCC)    GERD (gastroesophageal reflux disease)    Hyperlipidemia      Allergies  Allergen Reactions   Asa Arthritis Strength-Antacid [Aspirin Buffered] Anaphylaxis    GI upset   Ace Inhibitors Cough   Atorvastatin  Other (See Comments)   Lisinopril  Cough   Nsaids      Current Outpatient Medications  Medication Sig Dispense Refill   amLODipine  (NORVASC ) 5 MG tablet Take 1 tablet (5 mg total) by mouth daily. 90 tablet 1   Calcium  Carbonate (CALCIUM  500 PO) Take 1 tablet by mouth daily in the afternoon.     Capsicum, Cayenne, (CAYENNE PO) Take 1 capsule by mouth daily in the afternoon.     Celecoxib  (CELEBREX  PO) Take by mouth.     Cholecalciferol (VITAMIN D ) 50 MCG (2000 UT) CAPS Take 1 capsule (2,000 Units total) by mouth daily. 90 capsule 1   Emollient (COLLAGEN EX) Take 2  Scoops by mouth daily.     losartan  (COZAAR ) 100 MG tablet Take 1 tablet (100 mg total) by mouth daily. 90 tablet 1   Magnesium  250 MG TABS Take 1 tablet by mouth daily in the afternoon.     metFORMIN  (GLUCOPHAGE ) 1000 MG tablet Take 1 tablet (1,000 mg total) by mouth 2 (two) times daily with a meal. 180 tablet 1   methocarbamol (ROBAXIN) 500 MG tablet Take 500 mg by mouth at bedtime. 1 tablet at bedtime     milk thistle 175 MG tablet Take 175 mg by mouth daily. 1x daily     Evolocumab  (REPATHA  SURECLICK) 140 MG/ML SOAJ Inject 140 mg into the skin every 14 (fourteen) days. (Patient not taking: Reported on 04/24/2024) 6 mL 4   No current facility-administered medications for this visit.     Past Surgical History:  Procedure Laterality Date   BELPHAROPTOSIS REPAIR Right 07/23/2019   CARPAL TUNNEL RELEASE Right    CATARACT PEDIATRIC     right     Allergies  Allergen Reactions   Asa Arthritis Strength-Antacid [Aspirin Buffered] Anaphylaxis    GI upset   Ace Inhibitors Cough   Atorvastatin  Other (See Comments)   Lisinopril  Cough   Nsaids       Family History  Problem Relation Age of  Onset   Diabetes Mother    Hypertension Mother    Asthma Father    Arthritis Father      Social History Ms. Pawelski reports that she quit smoking about 12 years ago. Her smoking use included cigarettes. She started smoking about 47 years ago. She has a 52.5 pack-year smoking history. She has never used smokeless tobacco. Ms. Lamoreaux reports no history of alcohol use.   Physical Examination Today's Vitals   04/24/24 1115 04/24/24 1206  BP: (!) 148/90 (!) 150/90  Pulse: 79   SpO2: 95%   Weight: 200 lb (90.7 kg)   Height: 5\' 2"  (1.575 m)    Body mass index is 36.58 kg/m.  Gen: resting comfortably, no acute distress HEENT: no scleral icterus, pupils equal round and reactive, no palptable cervical adenopathy,  CV: RRR, no m/r,g no jvd Resp: Clear to auscultation bilaterally GI:  abdomen is soft, non-tender, non-distended, normal bowel sounds, no hepatosplenomegaly MSK: extremities are warm, no edema.  Skin: warm, no rash Neuro:  no focal deficits Psych: appropriate affect   Diagnostic Studies     Assessment and Plan   1.Lightheadedness - unclear etiology, symptoms are not cardiac in description. Lightheadness and blurry vision that comes on with sitting, improves with getting up and walking.  - no further cardiac testing is indicated   2. Hyperlipidemia - difficulty tolerating statins and zetia  - reports elevated in blood sugars on repatha  - she is not interested in leqvio at this time.    3. HTN - some lability in home numbers but no extreme highs or lows - continue current meds   EKG today shows NSR  Laurann Pollock, M.D.

## 2024-04-24 NOTE — Patient Instructions (Signed)
 Medication Instructions:  Your physician recommends that you continue on your current medications as directed. Please refer to the Current Medication list given to you today.   Labwork: None today  Testing/Procedures: None today  Follow-Up: 6 months  Any Other Special Instructions Will Be Listed Below (If Applicable).  If you need a refill on your cardiac medications before your next appointment, please call your pharmacy.

## 2024-05-01 ENCOUNTER — Ambulatory Visit (INDEPENDENT_AMBULATORY_CARE_PROVIDER_SITE_OTHER)

## 2024-05-01 ENCOUNTER — Encounter: Payer: Self-pay | Admitting: Nurse Practitioner

## 2024-05-01 ENCOUNTER — Ambulatory Visit (INDEPENDENT_AMBULATORY_CARE_PROVIDER_SITE_OTHER): Payer: Medicare HMO | Admitting: Nurse Practitioner

## 2024-05-01 VITALS — BP 138/80 | HR 73 | Temp 96.9°F | Ht 62.0 in | Wt 201.0 lb

## 2024-05-01 DIAGNOSIS — I152 Hypertension secondary to endocrine disorders: Secondary | ICD-10-CM

## 2024-05-01 DIAGNOSIS — E559 Vitamin D deficiency, unspecified: Secondary | ICD-10-CM

## 2024-05-01 DIAGNOSIS — E1159 Type 2 diabetes mellitus with other circulatory complications: Secondary | ICD-10-CM

## 2024-05-01 DIAGNOSIS — Z6833 Body mass index (BMI) 33.0-33.9, adult: Secondary | ICD-10-CM | POA: Diagnosis not present

## 2024-05-01 DIAGNOSIS — K219 Gastro-esophageal reflux disease without esophagitis: Secondary | ICD-10-CM

## 2024-05-01 DIAGNOSIS — E118 Type 2 diabetes mellitus with unspecified complications: Secondary | ICD-10-CM | POA: Diagnosis not present

## 2024-05-01 DIAGNOSIS — J984 Other disorders of lung: Secondary | ICD-10-CM | POA: Diagnosis not present

## 2024-05-01 DIAGNOSIS — M8588 Other specified disorders of bone density and structure, other site: Secondary | ICD-10-CM | POA: Diagnosis not present

## 2024-05-01 DIAGNOSIS — Z1211 Encounter for screening for malignant neoplasm of colon: Secondary | ICD-10-CM

## 2024-05-01 DIAGNOSIS — Z0000 Encounter for general adult medical examination without abnormal findings: Secondary | ICD-10-CM

## 2024-05-01 DIAGNOSIS — E785 Hyperlipidemia, unspecified: Secondary | ICD-10-CM

## 2024-05-01 DIAGNOSIS — E1169 Type 2 diabetes mellitus with other specified complication: Secondary | ICD-10-CM | POA: Diagnosis not present

## 2024-05-01 DIAGNOSIS — Z0001 Encounter for general adult medical examination with abnormal findings: Secondary | ICD-10-CM

## 2024-05-01 LAB — BAYER DCA HB A1C WAIVED: HB A1C (BAYER DCA - WAIVED): 7.4 % — ABNORMAL HIGH (ref 4.8–5.6)

## 2024-05-01 NOTE — Progress Notes (Signed)
 Subjective:    Patient ID: Katherine Ryan, female    DOB: 1956/10/19, 68 y.o.   MRN: [id number]   Chief Complaint: annual physical    HPI:  Katherine Ryan is a 68 y.o. who identifies as a female who was assigned female at birth.   Social history: Lives with: husband Work history: retired   Water engineer in today for follow up of the following chronic medical issues:  1. Hypertension associated with diabetes (HCC) No c/o chest pain, sob or headache. Does not check blood pressure at home except occasionally. BP Readings from Last 3 Encounters:  04/24/24 (!) 150/90  02/03/24 130/71  10/27/23 134/76     2. Hyperlipidemia associated with type 2 diabetes mellitus (HCC) Does try to watch diet. Does no dedictaed exercise. She was on repatha  and she stopped because she says it was raising her blood sugars. Lab Results  Component Value Date   CHOL 234 (H) 02/03/2024   HDL 58 02/03/2024   LDLCALC 151 (H) 02/03/2024   TRIG 138 02/03/2024   CHOLHDL 4.0 02/03/2024     3. Type 2 diabetes mellitus without complication, without long-term current use of insulin (HCC) Fasting blood sugars are running around 120's to 190's usually Lab Results  Component Value Date   HGBA1C 7.7 (H) 02/03/2024  '  4. Gastroesophageal reflux disease without esophagitis Uses OTC meds if needed  5. Vitamin D  deficiency Is on vitamin d  supplement daily  6. Osteopenia of lumbar spine Last dexascan was done on 03/30/21. T score was -1.4  7. BMI 33.0-33.9,adult Weight is unchanged  Wt Readings from Last 3 Encounters:  05/01/24 201 lb (91.2 kg)  04/24/24 200 lb (90.7 kg)  02/03/24 199 lb 12.8 oz (90.6 kg)   BMI Readings from Last 3 Encounters:  05/01/24 36.76 kg/m  04/24/24 36.58 kg/m  02/03/24 36.54 kg/m      New complaints: Is getting ready to have lower back injections.  Allergies  Allergen Reactions   Asa Arthritis Strength-Antacid [Aspirin Buffered] Anaphylaxis    GI upset   Ace  Inhibitors Cough   Atorvastatin  Other (See Comments)   Lisinopril  Cough   Nsaids    Outpatient Encounter Medications as of 05/01/2024  Medication Sig   amLODipine  (NORVASC ) 5 MG tablet Take 1 tablet (5 mg total) by mouth daily.   Calcium  Carbonate (CALCIUM  500 PO) Take 1 tablet by mouth daily in the afternoon.   Capsicum, Cayenne, (CAYENNE PO) Take 1 capsule by mouth daily in the afternoon.   Celecoxib  (CELEBREX  PO) Take by mouth.   Cholecalciferol (VITAMIN D ) 50 MCG (2000 UT) CAPS Take 1 capsule (2,000 Units total) by mouth daily.   Emollient (COLLAGEN EX) Take 2 Scoops by mouth daily.   Evolocumab  (REPATHA  SURECLICK) 140 MG/ML SOAJ Inject 140 mg into the skin every 14 (fourteen) days. (Patient not taking: Reported on 04/24/2024)   losartan  (COZAAR ) 100 MG tablet Take 1 tablet (100 mg total) by mouth daily.   Magnesium  250 MG TABS Take 1 tablet by mouth daily in the afternoon.   metFORMIN  (GLUCOPHAGE ) 1000 MG tablet Take 1 tablet (1,000 mg total) by mouth 2 (two) times daily with a meal.   methocarbamol (ROBAXIN) 500 MG tablet Take 500 mg by mouth at bedtime. 1 tablet at bedtime   milk thistle 175 MG tablet Take 175 mg by mouth daily. 1x daily   No facility-administered encounter medications on file as of 05/01/2024.    Past Surgical History:  Procedure Laterality Date  BELPHAROPTOSIS REPAIR Right 07/23/2019   CARPAL TUNNEL RELEASE Right    CATARACT PEDIATRIC     right    Family History  Problem Relation Age of Onset   Diabetes Mother    Hypertension Mother    Asthma Father    Arthritis Father       Controlled substance contract: n/a     Review of Systems  Constitutional:  Negative for diaphoresis.  Eyes:  Negative for pain.  Respiratory:  Negative for shortness of breath.   Cardiovascular:  Negative for chest pain, palpitations and leg swelling.  Gastrointestinal:  Negative for abdominal pain.  Endocrine: Negative for polydipsia.  Skin:  Negative for rash.   Neurological:  Negative for dizziness, weakness and headaches.  Hematological:  Does not bruise/bleed easily.  All other systems reviewed and are negative.      Objective:   Physical Exam Vitals and nursing note reviewed.  Constitutional:      General: She is not in acute distress.    Appearance: Normal appearance. She is well-developed.  HENT:     Head: Normocephalic.     Right Ear: Tympanic membrane normal.     Left Ear: Tympanic membrane normal.     Nose: Nose normal.     Mouth/Throat:     Mouth: Mucous membranes are moist.  Eyes:     Pupils: Pupils are equal, round, and reactive to light.  Neck:     Vascular: No carotid bruit or JVD.  Cardiovascular:     Rate and Rhythm: Normal rate and regular rhythm.     Heart sounds: Normal heart sounds.  Pulmonary:     Effort: Pulmonary effort is normal. No respiratory distress.     Breath sounds: Normal breath sounds. No wheezing or rales.  Chest:     Chest wall: No tenderness.  Abdominal:     General: Bowel sounds are normal. There is no distension or abdominal bruit.     Palpations: Abdomen is soft. There is no hepatomegaly, splenomegaly, mass or pulsatile mass.     Tenderness: There is no abdominal tenderness.  Musculoskeletal:        General: Normal range of motion.     Cervical back: Normal range of motion and neck supple.     Right lower leg: Edema (1+) present.     Left lower leg: Edema (1+) present.  Lymphadenopathy:     Cervical: No cervical adenopathy.  Skin:    General: Skin is warm and dry.  Neurological:     Mental Status: She is alert and oriented to person, place, and time.     Deep Tendon Reflexes: Reflexes are normal and symmetric.  Psychiatric:        Behavior: Behavior normal.        Thought Content: Thought content normal.        Judgment: Judgment normal.     BP 138/80   Pulse 73   Temp (!) 96.9 F (36.1 C) (Temporal)   Ht 5\' 2"  (1.575 m)   Wt 201 lb (91.2 kg)   LMP 04/04/2005   SpO2 97%    BMI 36.76 kg/m   Chest xray normal-Preliminary reading by Irvine Mantis, FNP  [facility]  LMP 04/04/2005   Hgba1c 7.4%    Assessment & Plan:   Chiffon Culliton comes in today with chief complaint of annual physical  Diagnosis and orders addressed:  1. Hypertension associated with diabetes (HCC) Low sodium diet - CBC with Differential/Platelet - CMP14+EGFR - amLODipine  (NORVASC )  5 MG tablet; Take 1 tablet (5 mg total) by mouth daily.  Dispense: 90 tablet; Refill: 1 - losartan  (COZAAR ) 100 MG tablet; Take 1 tablet (100 mg total) by mouth daily.  Dispense: 90 tablet; Refill: 1  2. Hyperlipidemia associated with type 2 diabetes mellitus (HCC) Low fat diet - Lipid panel - Evolocumab  (REPATHA  SURECLICK) 140 MG/ML SOAJ; Inject 140 mg into the skin every 14 (fourteen) days.  Dispense: 6 mL; Refill: 4  3. Type 2 diabetes mellitus without complication, without long-term current use of insulin (HCC) Stricter carb counting Increase metformin  1000mg  bid Keep diary of blood sugars - Bayer DCA Hb A1c Waived - Microalbumin / creatinine urine ratio - metFORMIN  (GLUCOPHAGE ) 500 MG tablet; Take 1 tablet (500 mg total) by mouth daily with breakfast.  Dispense: 90 tablet; Refill: 1  4. Gastroesophageal reflux disease without esophagitis Avoid spicy foods Do not eat 2 hours prior to bedtime   5. Vitamin D  deficiency Continue vitamin d  supplement - Cholecalciferol (VITAMIN D ) 50 MCG (2000 UT) CAPS; Take 1 capsule (2,000 Units total) by mouth daily.  Dispense: 90 capsule; Refill: 1  6. Osteopenia of lumbar spine Weight bearing exrcises  7. BMI 33.0-33.9,adult Discussed diet and exercise for person with BMI >25 Will recheck weight in 3-6 months    Labs pending Health Maintenance reviewed Diet and exercise encouraged  Follow up plan: 6 months   Mary-Margaret Gaylyn Keas, FNP

## 2024-05-02 LAB — CMP14+EGFR
ALT: 37 IU/L — ABNORMAL HIGH (ref 0–32)
AST: 22 IU/L (ref 0–40)
Albumin: 4.5 g/dL (ref 3.9–4.9)
Alkaline Phosphatase: 92 IU/L (ref 44–121)
BUN/Creatinine Ratio: 23 (ref 12–28)
BUN: 18 mg/dL (ref 8–27)
Bilirubin Total: 0.2 mg/dL (ref 0.0–1.2)
CO2: 21 mmol/L (ref 20–29)
Calcium: 9.6 mg/dL (ref 8.7–10.3)
Chloride: 96 mmol/L (ref 96–106)
Creatinine, Ser: 0.79 mg/dL (ref 0.57–1.00)
Globulin, Total: 2.9 g/dL (ref 1.5–4.5)
Glucose: 157 mg/dL — ABNORMAL HIGH (ref 70–99)
Potassium: 4.7 mmol/L (ref 3.5–5.2)
Sodium: 136 mmol/L (ref 134–144)
Total Protein: 7.4 g/dL (ref 6.0–8.5)
eGFR: 82 mL/min/{1.73_m2} (ref >59)

## 2024-05-02 LAB — CBC WITH DIFFERENTIAL/PLATELET
Basophils Absolute: 0 10*3/uL (ref 0.0–0.2)
Basos: 0 %
EOS (ABSOLUTE): 0.1 10*3/uL (ref 0.0–0.4)
Eos: 2 %
Hematocrit: 42.4 % (ref 34.0–46.6)
Hemoglobin: 13.8 g/dL (ref 11.1–15.9)
Immature Grans (Abs): 0 10*3/uL (ref 0.0–0.1)
Immature Granulocytes: 0 %
Lymphocytes Absolute: 1.5 10*3/uL (ref 0.7–3.1)
Lymphs: 21 %
MCH: 29.2 pg (ref 26.6–33.0)
MCHC: 32.5 g/dL (ref 31.5–35.7)
MCV: 90 fL (ref 79–97)
Monocytes Absolute: 0.8 10*3/uL (ref 0.1–0.9)
Monocytes: 11 %
Neutrophils Absolute: 4.7 10*3/uL (ref 1.4–7.0)
Neutrophils: 66 %
Platelets: 278 10*3/uL (ref 150–450)
RBC: 4.73 x10E6/uL (ref 3.77–5.28)
RDW: 13.1 % (ref 11.7–15.4)
WBC: 7.1 10*3/uL (ref 3.4–10.8)

## 2024-05-02 LAB — LIPID PANEL
Chol/HDL Ratio: 4.4 ratio (ref 0.0–4.4)
Cholesterol, Total: 293 mg/dL — ABNORMAL HIGH (ref 100–199)
HDL: 67 mg/dL (ref >39)
LDL Chol Calc (NIH): 190 mg/dL — ABNORMAL HIGH (ref 0–99)
Triglycerides: 192 mg/dL — ABNORMAL HIGH (ref 0–149)
VLDL Cholesterol Cal: 36 mg/dL (ref 5–40)

## 2024-05-03 DIAGNOSIS — M8588 Other specified disorders of bone density and structure, other site: Secondary | ICD-10-CM | POA: Diagnosis not present

## 2024-05-03 DIAGNOSIS — Z780 Asymptomatic menopausal state: Secondary | ICD-10-CM | POA: Diagnosis not present

## 2024-05-03 DIAGNOSIS — M5416 Radiculopathy, lumbar region: Secondary | ICD-10-CM | POA: Diagnosis not present

## 2024-05-28 ENCOUNTER — Other Ambulatory Visit: Payer: Self-pay | Admitting: Nurse Practitioner

## 2024-05-28 DIAGNOSIS — E1159 Type 2 diabetes mellitus with other circulatory complications: Secondary | ICD-10-CM

## 2024-05-28 DIAGNOSIS — E119 Type 2 diabetes mellitus without complications: Secondary | ICD-10-CM

## 2024-06-12 DIAGNOSIS — E1142 Type 2 diabetes mellitus with diabetic polyneuropathy: Secondary | ICD-10-CM | POA: Diagnosis not present

## 2024-06-12 DIAGNOSIS — M79674 Pain in right toe(s): Secondary | ICD-10-CM | POA: Diagnosis not present

## 2024-06-12 DIAGNOSIS — M79675 Pain in left toe(s): Secondary | ICD-10-CM | POA: Diagnosis not present

## 2024-06-12 DIAGNOSIS — B351 Tinea unguium: Secondary | ICD-10-CM | POA: Diagnosis not present

## 2024-06-12 DIAGNOSIS — L84 Corns and callosities: Secondary | ICD-10-CM | POA: Diagnosis not present

## 2024-06-20 DIAGNOSIS — Z1211 Encounter for screening for malignant neoplasm of colon: Secondary | ICD-10-CM | POA: Diagnosis not present

## 2024-06-23 LAB — COLOGUARD: COLOGUARD: NEGATIVE

## 2024-07-19 DIAGNOSIS — S0571XD Avulsion of right eye, subsequent encounter: Secondary | ICD-10-CM | POA: Diagnosis not present

## 2024-08-24 ENCOUNTER — Other Ambulatory Visit: Payer: Self-pay | Admitting: Nurse Practitioner

## 2024-08-24 DIAGNOSIS — E1159 Type 2 diabetes mellitus with other circulatory complications: Secondary | ICD-10-CM

## 2024-09-11 DIAGNOSIS — E1142 Type 2 diabetes mellitus with diabetic polyneuropathy: Secondary | ICD-10-CM | POA: Diagnosis not present

## 2024-09-11 DIAGNOSIS — L84 Corns and callosities: Secondary | ICD-10-CM | POA: Diagnosis not present

## 2024-09-11 DIAGNOSIS — B351 Tinea unguium: Secondary | ICD-10-CM | POA: Diagnosis not present

## 2024-09-11 DIAGNOSIS — M79674 Pain in right toe(s): Secondary | ICD-10-CM | POA: Diagnosis not present

## 2024-09-11 DIAGNOSIS — M79675 Pain in left toe(s): Secondary | ICD-10-CM | POA: Diagnosis not present

## 2024-09-26 ENCOUNTER — Telehealth: Payer: Self-pay

## 2024-09-26 NOTE — Telephone Encounter (Signed)
 Copied from CRM #[id number]. Topic: Clinical - Medication Question >> Sep 26, 2024 11:47 AM Katherine Ryan wrote: Reason for CRM: Patient is between the ages of 87-75 and diabetic but not on a statin medication to reduce cardiovascular risks. Is inquiring if she can be prescribed one.  [name] can be reached at [phone]

## 2024-09-26 NOTE — Telephone Encounter (Signed)
 I called and spoke with patient about this and she confirmed that she has not spoken with anyone about this and does not want cholesterol medication. This is the 2nd call I've seen today from someone calling named [name] and I believe its a scam caller.

## 2024-11-02 ENCOUNTER — Encounter: Payer: Self-pay | Admitting: Nurse Practitioner

## 2024-11-02 ENCOUNTER — Telehealth: Payer: Self-pay

## 2024-11-02 ENCOUNTER — Ambulatory Visit: Payer: Self-pay | Admitting: Nurse Practitioner

## 2024-11-02 VITALS — BP 126/74 | HR 82 | Temp 98.2°F | Ht 62.0 in | Wt 205.0 lb

## 2024-11-02 DIAGNOSIS — K219 Gastro-esophageal reflux disease without esophagitis: Secondary | ICD-10-CM | POA: Diagnosis not present

## 2024-11-02 DIAGNOSIS — E1169 Type 2 diabetes mellitus with other specified complication: Secondary | ICD-10-CM | POA: Diagnosis not present

## 2024-11-02 DIAGNOSIS — E118 Type 2 diabetes mellitus with unspecified complications: Secondary | ICD-10-CM | POA: Diagnosis not present

## 2024-11-02 DIAGNOSIS — E785 Hyperlipidemia, unspecified: Secondary | ICD-10-CM | POA: Diagnosis not present

## 2024-11-02 DIAGNOSIS — Z6833 Body mass index (BMI) 33.0-33.9, adult: Secondary | ICD-10-CM | POA: Diagnosis not present

## 2024-11-02 DIAGNOSIS — E1159 Type 2 diabetes mellitus with other circulatory complications: Secondary | ICD-10-CM | POA: Diagnosis not present

## 2024-11-02 DIAGNOSIS — E559 Vitamin D deficiency, unspecified: Secondary | ICD-10-CM

## 2024-11-02 DIAGNOSIS — M8588 Other specified disorders of bone density and structure, other site: Secondary | ICD-10-CM | POA: Diagnosis not present

## 2024-11-02 DIAGNOSIS — I152 Hypertension secondary to endocrine disorders: Secondary | ICD-10-CM | POA: Diagnosis not present

## 2024-11-02 DIAGNOSIS — G720 Drug-induced myopathy: Secondary | ICD-10-CM

## 2024-11-02 DIAGNOSIS — E119 Type 2 diabetes mellitus without complications: Secondary | ICD-10-CM

## 2024-11-02 LAB — BAYER DCA HB A1C WAIVED: HB A1C (BAYER DCA - WAIVED): 8 % — ABNORMAL HIGH (ref 4.8–5.6)

## 2024-11-02 MED ORDER — METFORMIN HCL 1000 MG PO TABS: 1000.0000 mg | ORAL_TABLET | Freq: Two times a day (BID) | ORAL | 1 refills | Status: AC

## 2024-11-02 MED ORDER — AMLODIPINE BESYLATE 5 MG PO TABS: 5.0000 mg | ORAL_TABLET | Freq: Every day | ORAL | 1 refills | Status: AC

## 2024-11-02 MED ORDER — LOSARTAN POTASSIUM 100 MG PO TABS: 100.0000 mg | ORAL_TABLET | Freq: Every day | ORAL | 1 refills | Status: AC

## 2024-11-02 MED ORDER — DAPAGLIFLOZIN PROPANEDIOL 5 MG PO TABS: 5.0000 mg | ORAL_TABLET | Freq: Every day | ORAL | 3 refills | Status: AC

## 2024-11-02 NOTE — Telephone Encounter (Signed)
 Copied from CRM [id number]. Topic: Clinical - Prescription Issue >> Nov 02, 2024 10:51 AM Tonda B wrote: Reason for CRM: patient is calling has question about her rx  dapagliflozin propanediol (FARXIGA) 5 MG TABS tablet please call pt back [phone] (H)

## 2024-11-02 NOTE — Patient Instructions (Signed)
 Dapagliflozin Tablets What is this medication? DAPAGLIFLOZIN (DAP a gli FLOE zin) treats type 2 diabetes. It works by helping your kidneys remove sugar (glucose) from your blood through the urine, which decreases your blood sugar. It may also be used to lower the risk of worsening disease and death caused by kidney disease and heart failure. It works by helping your kidneys remove salt (sodium) from your blood through the urine. This decreases the amount of work the kidneys and heart have to do. Changes to diet and exercise are often combined with this medication. This medicine may be used for other purposes; ask your health care provider or pharmacist if you have questions. COMMON BRAND NAME(S): Marcelline Deist What should I tell my care team before I take this medication? They need to know if you have any of these conditions: Change in diet, eating less Changes to your insulin dose Dehydration Diet low in salt Frequently drink alcohol Having surgery History of diabetic ketoacidosis (DKA) History of genital infections History of pancreatitis or pancreas problems History of urinary tract infections (UTI) Kidney disease Liver disease Serious infection Trouble passing urine Type 1 diabetes An unusual or allergic reaction to dapagliflozin, other medications, foods, dyes, or preservatives Pregnant or trying to get pregnant Breastfeeding How should I use this medication? Take this medication by mouth with water. Take it as directed on the prescription label at the same time every day. You can take it with or without food. If it upsets your stomach, take it with food. Keep taking it unless your care team tells you to stop. A special MedGuide will be given to you by the pharmacist with each prescription and refill. Be sure to read this information carefully each time. Talk to your care team about the use of this medication in children. While it may be prescribed for children as young as 10 years for  selected conditions, precautions do apply. Overdosage: If you think you have taken too much of this medicine contact a poison control center or emergency room at once. NOTE: This medicine is only for you. Do not share this medicine with others. What if I miss a dose? If you miss a dose, take it as soon as you can. If it is almost time for your next dose, take only that dose. Do not take double or extra doses. What may interact with this medication? Lithium Sulfonylureas, such as glimepiride, glipizide, glyburide This list may not describe all possible interactions. Give your health care provider a list of all the medicines, herbs, non-prescription drugs, or dietary supplements you use. Also tell them if you smoke, drink alcohol, or use illegal drugs. Some items may interact with your medicine. What should I watch for while using this medication? Visit your care team for regular checks on your progress. Tell your care team if your symptoms do not start to get better or if they get worse. You may need blood work done while you are taking this medication. Your care team will monitor your HbA1C (A1C). This test shows what your average blood sugar (glucose) level was over the past 2 to 3 months. Know the symptoms of low blood sugar and know how to treat it. Always carry a source of quick sugar with you. Examples include hard sugar candy or glucose tablets. Make sure others know that you can choke if you eat or drink if your blood sugar is too low and you are unable to care for yourself. Get medical help at once. Tell your  care team if you have high blood sugar. Your medication dose may change if your body is under stress. Some types of stress that may affect your blood sugar include fever, infection, and surgery. This medication may increase the risk of diabetic ketoacidosis (DKA), a serious condition in which high ketone levels make your blood too acidic. Your care team may ask you to check for ketones in  your urine or blood while you are taking this medication. If you develop nausea, vomiting, stomach pain, unusual weakness or fatigue, or trouble breathing, stop taking this medication and call your care team right away. Check with your care team if you have severe diarrhea, nausea, and vomiting, or if you sweat a lot. The loss of too much body fluid may make it dangerous for you to take this medication. Wear a medical ID bracelet or chain. Carry a card that describes your condition. List the medications and doses you take on the card. What side effects may I notice from receiving this medication? Side effects that you should report to your care team as soon as possible: Allergic reactions--skin rash, itching, hives, swelling of the face, lips, tongue, or throat Dehydration--increased thirst, dry mouth, feeling faint or lightheaded, headache, dark yellow or brown urine Diabetic ketoacidosis (DKA)--increased thirst or amount of urine, dry mouth, fatigue, fruity odor to breath, trouble breathing, stomach pain, nausea, vomiting Genital yeast infection--redness, swelling, pain, or itchiness, odor, thick or lumpy discharge New pain or tenderness, change in skin color, sores or ulcers, infection of the leg or foot Infection or redness, swelling, tenderness, or pain in the genitals, or area from the genitals to the back of the rectum Urinary tract infection (UTI)--burning when passing urine, passing frequent small amounts of urine, bloody or cloudy urine, pain in the lower back or sides This list may not describe all possible side effects. Call your doctor for medical advice about side effects. You may report side effects to FDA at 1-800-FDA-1088. Where should I keep my medication? Keep out of the reach of children and pets. Store at room temperature between 20 and 25 degrees C (68 and 77 degrees F). Get rid of any unused medication after the expiration date. To get rid of medications that are no longer  needed or have expired: Take the medication to a medication take-back program. Check with your pharmacy or law enforcement to find a location. If you cannot return the medication, check the label or package insert to see if the medication should be thrown out in the garbage or flushed down the toilet. If you are not sure, ask your care team. If it is safe to put it in the trash, take the medication out of the container. Mix the medication with cat litter, dirt, coffee grounds, or other unwanted substance. Seal the mixture in a bag or container. Put it in the trash. NOTE: This sheet is a summary. It may not cover all possible information. If you have questions about this medicine, talk to your doctor, pharmacist, or health care provider.  2024 Elsevier/Gold Standard (2023-06-13 00:00:00)

## 2024-11-02 NOTE — Telephone Encounter (Signed)
 Spoke to patient. She says co-pay for farxiga is too much. She says over $100.00. She says she wants to work on her diet and exercise until next visit in February. She says if her numbers remain elevated she will try some form of blood sugar medication.

## 2024-11-02 NOTE — Progress Notes (Signed)
 Subjective:    Patient ID: Katherine Ryan, female    DOB: 11-19-1956, 68 y.o.   MRN: [id number]   Chief Complaint: medical management of chronic issues     HPI:  Katherine Ryan is a 68 y.o. who identifies as a female who was assigned female at birth.   Social history: Lives with: husband Work history: retired   Water Engineer in today for follow up of the following chronic medical issues:  1. Hypertension associated with diabetes (HCC) No c/o chest pain, sob or headache. Does not check blood pressure at home except occasionally. BP Readings from Last 3 Encounters:  05/01/24 138/80  04/24/24 (!) 150/90  02/03/24 130/71     2. Hyperlipidemia associated with type 2 diabetes mellitus (HCC) Does try to watch diet. Does no dedictaed exercise. Refuses treatment- repatha  made her blood sugars go up so she refuses that as well. Lab Results  Component Value Date   CHOL 293 (H) 05/01/2024   HDL 67 05/01/2024   LDLCALC 190 (H) 05/01/2024   TRIG 192 (H) 05/01/2024   CHOLHDL 4.4 05/01/2024     3. Type 2 diabetes mellitus without complication, without long-term current use of insulin (HCC) Fasting blood sugars are running around 150's usually Lab Results  Component Value Date   HGBA1C 7.4 (H) 05/01/2024  '  4. Gastroesophageal reflux disease without esophagitis Uses OTC meds if needed  5. Vitamin D  deficiency Is on vitamin d  supplement daily  6. Osteopenia of lumbar spine Last dexascan was done on 03/30/21. T score was -1.4  7. BMI 33.0-33.9,adult Weight is up 4lbs   Wt Readings from Last 3 Encounters:  11/02/24 205 lb (93 kg)  05/01/24 201 lb (91.2 kg)  04/24/24 200 lb (90.7 kg)   BMI Readings from Last 3 Encounters:  11/02/24 37.49 kg/m  05/01/24 36.76 kg/m  04/24/24 36.58 kg/m        New complaints: None today  Allergies  Allergen Reactions   Asa Arthritis Strength-Antacid [Aspirin Buffered] Anaphylaxis    GI upset   Ace Inhibitors Cough   Atorvastatin   Other (See Comments)   Lisinopril  Cough   Nsaids    Outpatient Encounter Medications as of 11/02/2024  Medication Sig   amLODipine  (NORVASC ) 5 MG tablet TAKE ONE TABLET BY MOUTH DAILY   Calcium  Carbonate (CALCIUM  500 PO) Take 1 tablet by mouth daily in the afternoon.   Capsicum, Cayenne, (CAYENNE PO) Take 1 capsule by mouth daily in the afternoon.   Cholecalciferol (VITAMIN D ) 50 MCG (2000 UT) CAPS Take 1 capsule (2,000 Units total) by mouth daily.   Emollient (COLLAGEN EX) Take 2 Scoops by mouth daily.   losartan  (COZAAR ) 100 MG tablet TAKE ONE TABLET BY MOUTH DAILY   Magnesium  250 MG TABS Take 1 tablet by mouth daily in the afternoon.   metFORMIN  (GLUCOPHAGE ) 1000 MG tablet TAKE ONE TABLET TWICE DAILY WITH MEAL(S)   milk thistle 175 MG tablet Take 175 mg by mouth daily. 1x daily   No facility-administered encounter medications on file as of 11/02/2024.    Past Surgical History:  Procedure Laterality Date   BELPHAROPTOSIS REPAIR Right 07/23/2019   CARPAL TUNNEL RELEASE Right    CATARACT PEDIATRIC     right    Family History  Problem Relation Age of Onset   Diabetes Mother    Hypertension Mother    Asthma Father    Arthritis Father       Controlled substance contract: n/a     Review of  Systems  Constitutional:  Negative for diaphoresis.  Eyes:  Negative for pain.  Respiratory:  Negative for shortness of breath.   Cardiovascular:  Negative for chest pain, palpitations and leg swelling.  Gastrointestinal:  Negative for abdominal pain.  Endocrine: Negative for polydipsia.  Skin:  Negative for rash.  Neurological:  Negative for dizziness, weakness and headaches.  Hematological:  Does not bruise/bleed easily.  All other systems reviewed and are negative.      Objective:   Physical Exam Vitals and nursing note reviewed.  Constitutional:      General: She is not in acute distress.    Appearance: Normal appearance. She is well-developed.  HENT:     Head:  Normocephalic.     Right Ear: Tympanic membrane normal.     Left Ear: Tympanic membrane normal.     Nose: Nose normal.     Mouth/Throat:     Mouth: Mucous membranes are moist.  Eyes:     Pupils: Pupils are equal, round, and reactive to light.  Neck:     Vascular: No carotid bruit or JVD.  Cardiovascular:     Rate and Rhythm: Normal rate and regular rhythm.     Heart sounds: Normal heart sounds.  Pulmonary:     Effort: Pulmonary effort is normal. No respiratory distress.     Breath sounds: Normal breath sounds. No wheezing or rales.  Chest:     Chest wall: No tenderness.  Abdominal:     General: Bowel sounds are normal. There is no distension or abdominal bruit.     Palpations: Abdomen is soft. There is no hepatomegaly, splenomegaly, mass or pulsatile mass.     Tenderness: There is no abdominal tenderness.  Musculoskeletal:        General: Normal range of motion.     Cervical back: Normal range of motion and neck supple.     Right lower leg: Edema (1+) present.     Left lower leg: Edema (1+) present.  Lymphadenopathy:     Cervical: No cervical adenopathy.  Skin:    General: Skin is warm and dry.  Neurological:     Mental Status: She is alert and oriented to person, place, and time.     Deep Tendon Reflexes: Reflexes are normal and symmetric.  Psychiatric:        Behavior: Behavior normal.        Thought Content: Thought content normal.        Judgment: Judgment normal.     BP 126/74   Pulse 82   Temp 98.2 F (36.8 C) (Temporal)   Ht 5' 2 (1.575 m)   Wt 205 lb (93 kg)   LMP 04/04/2005   SpO2 93%   BMI 37.49 kg/m    LMP 04/04/2005   Hgba1c 8.0%    Assessment & Plan:   Katherine Ryan comes in today with chief complaint of medical management of chronic issues    Diagnosis and orders addressed:  1. Hypertension associated with diabetes (HCC) Low sodium diet - CBC with Differential/Platelet - CMP14+EGFR - amLODipine  (NORVASC ) 5 MG tablet; Take 1 tablet (5  mg total) by mouth daily.  Dispense: 90 tablet; Refill: 1 - losartan  (COZAAR ) 100 MG tablet; Take 1 tablet (100 mg total) by mouth daily.  Dispense: 90 tablet; Refill: 1  2. Hyperlipidemia associated with type 2 diabetes mellitus (HCC) Low fat diet - Lipid panel - Evolocumab  (REPATHA  SURECLICK) 140 MG/ML SOAJ; Inject 140 mg into the skin every 14 (fourteen) days.  Dispense: 6 mL; Refill: 4  3. Type 2 diabetes mellitus without complication, without long-term current use of insulin (HCC) Stricter carb counting Increase metformin  1000mg  bid Keep diary of blood sugars Added farxiga 5mg  dialy  - Bayer DCA Hb A1c Waived - Microalbumin / creatinine urine ratio - metFORMIN  (GLUCOPHAGE ) 500 MG tablet; Take 1 tablet (500 mg total) by mouth daily with breakfast.  Dispense: 90 tablet; Refill: 1  4. Gastroesophageal reflux disease without esophagitis Avoid spicy foods Do not eat 2 hours prior to bedtime   5. Vitamin D  deficiency Continue vitamin d  supplement - Cholecalciferol (VITAMIN D ) 50 MCG (2000 UT) CAPS; Take 1 capsule (2,000 Units total) by mouth daily.  Dispense: 90 capsule; Refill: 1  6. Osteopenia of lumbar spine Weight bearing exrcises  7. BMI 33.0-33.9,adult Discussed diet and exercise for person with BMI >25 Will recheck weight in 3-6 months    Labs pending Health Maintenance reviewed Diet and exercise encouraged  Follow up plan: 3 months   Mary-Margaret Gladis, FNP

## 2024-11-03 LAB — CMP14+EGFR
ALT: 40 IU/L — ABNORMAL HIGH (ref 0–32)
AST: 23 IU/L (ref 0–40)
Albumin: 4.5 g/dL (ref 3.9–4.9)
Alkaline Phosphatase: 84 IU/L (ref 49–135)
BUN/Creatinine Ratio: 23 (ref 12–28)
BUN: 17 mg/dL (ref 8–27)
Bilirubin Total: 0.2 mg/dL (ref 0.0–1.2)
CO2: 25 mmol/L (ref 20–29)
Calcium: 9.8 mg/dL (ref 8.7–10.3)
Chloride: 98 mmol/L (ref 96–106)
Creatinine, Ser: 0.74 mg/dL (ref 0.57–1.00)
Globulin, Total: 3 g/dL (ref 1.5–4.5)
Glucose: 202 mg/dL — ABNORMAL HIGH (ref 70–99)
Potassium: 5.7 mmol/L — ABNORMAL HIGH (ref 3.5–5.2)
Sodium: 138 mmol/L (ref 134–144)
Total Protein: 7.5 g/dL (ref 6.0–8.5)
eGFR: 88 mL/min/1.73 (ref >59)

## 2024-11-03 LAB — LIPID PANEL
Chol/HDL Ratio: 4.4 ratio (ref 0.0–4.4)
Cholesterol, Total: 293 mg/dL — ABNORMAL HIGH (ref 100–199)
HDL: 67 mg/dL (ref >39)
LDL Chol Calc (NIH): 196 mg/dL — ABNORMAL HIGH (ref 0–99)
Triglycerides: 164 mg/dL — ABNORMAL HIGH (ref 0–149)
VLDL Cholesterol Cal: 30 mg/dL (ref 5–40)

## 2024-11-03 LAB — CBC WITH DIFFERENTIAL/PLATELET
Basophils Absolute: 0 x10E3/uL (ref 0.0–0.2)
Basos: 0 %
EOS (ABSOLUTE): 0.1 x10E3/uL (ref 0.0–0.4)
Eos: 2 %
Hematocrit: 42.9 % (ref 34.0–46.6)
Hemoglobin: 13.9 g/dL (ref 11.1–15.9)
Immature Grans (Abs): 0 x10E3/uL (ref 0.0–0.1)
Immature Granulocytes: 0 %
Lymphocytes Absolute: 1.5 x10E3/uL (ref 0.7–3.1)
Lymphs: 21 %
MCH: 29.2 pg (ref 26.6–33.0)
MCHC: 32.4 g/dL (ref 31.5–35.7)
MCV: 90 fL (ref 79–97)
Monocytes Absolute: 0.7 x10E3/uL (ref 0.1–0.9)
Monocytes: 10 %
Neutrophils Absolute: 5 x10E3/uL (ref 1.4–7.0)
Neutrophils: 67 %
Platelets: 279 x10E3/uL (ref 150–450)
RBC: 4.76 x10E6/uL (ref 3.77–5.28)
RDW: 13.2 % (ref 11.7–15.4)
WBC: 7.4 x10E3/uL (ref 3.4–10.8)

## 2024-11-03 LAB — VITAMIN B12: Vitamin B-12: 346 pg/mL (ref 232–1245)

## 2024-11-05 ENCOUNTER — Other Ambulatory Visit

## 2024-11-05 ENCOUNTER — Other Ambulatory Visit: Payer: Self-pay

## 2024-11-05 ENCOUNTER — Ambulatory Visit: Payer: Self-pay | Admitting: Nurse Practitioner

## 2024-11-05 DIAGNOSIS — E118 Type 2 diabetes mellitus with unspecified complications: Secondary | ICD-10-CM

## 2024-11-06 LAB — MICROALBUMIN / CREATININE URINE RATIO
Creatinine, Urine: 31.2 mg/dL
Microalb/Creat Ratio: 32 mg/g{creat} — ABNORMAL HIGH (ref 0–29)
Microalbumin, Urine: 10.1 ug/mL

## 2024-12-04 DIAGNOSIS — E1142 Type 2 diabetes mellitus with diabetic polyneuropathy: Secondary | ICD-10-CM | POA: Diagnosis not present

## 2024-12-04 DIAGNOSIS — M79674 Pain in right toe(s): Secondary | ICD-10-CM | POA: Diagnosis not present

## 2024-12-04 DIAGNOSIS — B351 Tinea unguium: Secondary | ICD-10-CM | POA: Diagnosis not present

## 2024-12-04 DIAGNOSIS — L84 Corns and callosities: Secondary | ICD-10-CM | POA: Diagnosis not present

## 2024-12-04 DIAGNOSIS — M79675 Pain in left toe(s): Secondary | ICD-10-CM | POA: Diagnosis not present

## 2024-12-24 LAB — OPHTHALMOLOGY REPORT-SCANNED

## 2025-01-09 ENCOUNTER — Ambulatory Visit: Payer: Medicare HMO

## 2025-01-09 VITALS — BP 126/74 | HR 82 | Ht 62.0 in | Wt 205.0 lb

## 2025-01-09 DIAGNOSIS — Z1231 Encounter for screening mammogram for malignant neoplasm of breast: Secondary | ICD-10-CM

## 2025-01-09 DIAGNOSIS — Z0000 Encounter for general adult medical examination without abnormal findings: Secondary | ICD-10-CM

## 2025-01-09 NOTE — Progress Notes (Signed)
 "  Chief Complaint  Patient presents with   Medicare Wellness     Subjective:   Katherine Ryan is a 69 y.o. female who presents for a Medicare Annual Wellness Visit.  Visit info / Clinical Intake: Medicare Wellness Visit Type:: Subsequent Annual Wellness Visit Persons participating in visit and providing information:: patient Medicare Wellness Visit Mode:: Telephone If telephone:: video declined Since this visit was completed virtually, some vitals may be partially provided or unavailable. Missing vitals are due to the limitations of the virtual format.: Unable to obtain vitals - no equipment If Telephone or Video please confirm:: I connected with patient using audio/video enable telemedicine. I verified patient identity with two identifiers, discussed telehealth limitations, and patient agreed to proceed. Patient Location:: home Provider Location:: office Interpreter Needed?: No Pre-visit prep was completed: yes AWV questionnaire completed by patient prior to visit?: no Living arrangements:: lives with spouse/significant other Patient's Overall Health Status Rating: very good Typical amount of pain: none Does pain affect daily life?: no Are you currently prescribed opioids?: no  Dietary Habits and Nutritional Risks How many meals a day?: 2 Eats fruit and vegetables daily?: yes Most meals are obtained by: preparing own meals In the last 2 weeks, have you had any of the following?: none Diabetic:: (!) yes Any non-healing wounds?: no How often do you check your BS?: as needed Would you like to be referred to a Nutritionist or for Diabetic Management? : no  Functional Status Activities of Daily Living (to include ambulation/medication): Independent Ambulation: Independent Medication Administration: Independent Home Management (perform basic housework or laundry): Independent Manage your own finances?: yes Primary transportation is: family / friends Concerns about vision?: no  *vision screening is required for WTM* (12/24/24 last ov) Concerns about hearing?: no  Fall Screening Falls in the past year?: 0 Number of falls in past year: 0 Was there an injury with Fall?: 0 Fall Risk Category Calculator: 0 Patient Fall Risk Level: Low Fall Risk  Fall Risk Patient at Risk for Falls Due to: No Fall Risks Fall risk Follow up: Falls evaluation completed; Education provided  Home and Transportation Safety: All rugs have non-skid backing?: yes All stairs or steps have railings?: (!) no Grab bars in the bathtub or shower?: yes Have non-skid surface in bathtub or shower?: yes Good home lighting?: yes Regular seat belt use?: yes Hospital stays in the last year:: no  Cognitive Assessment Difficulty concentrating, remembering, or making decisions? : no Will 6CIT or Mini Cog be Completed: yes What year is it?: 0 points What month is it?: 0 points Give patient an address phrase to remember (5 components): [address] [place] About what time is it?: 0 points Count backwards from 20 to 1: 0 points Say the months of the year in reverse: 0 points Repeat the address phrase from earlier: 0 points 6 CIT Score: 0 points  Advance Directives (For Healthcare) Does Patient Have a Medical Advance Directive?: No Would patient like information on creating a medical advance directive?: No - Patient declined  Reviewed/Updated  Reviewed/Updated: Reviewed All (Medical, Surgical, Family, Medications, Allergies, Care Teams, Patient Goals); Medical History; Surgical History; Family History; Medications; Allergies; Care Teams; Patient Goals    Allergies (verified) Asa arthritis strength-antacid [aspirin buffered], Ace inhibitors, Atorvastatin , Lisinopril , and Nsaids   Current Medications (verified) Outpatient Encounter Medications as of 01/09/2025  Medication Sig   amLODipine  (NORVASC ) 5 MG tablet Take 1 tablet (5 mg total) by mouth daily.   Calcium  Carbonate (CALCIUM   500  PO) Take 1 tablet by mouth daily in the afternoon.   Capsicum, Cayenne, (CAYENNE PO) Take 1 capsule by mouth daily in the afternoon.   Cholecalciferol (VITAMIN D ) 50 MCG (2000 UT) CAPS Take 1 capsule (2,000 Units total) by mouth daily.   dapagliflozin  propanediol (FARXIGA ) 5 MG TABS tablet Take 1 tablet (5 mg total) by mouth daily.   Emollient (COLLAGEN EX) Take 2 Scoops by mouth daily.   losartan  (COZAAR ) 100 MG tablet Take 1 tablet (100 mg total) by mouth daily.   Magnesium  250 MG TABS Take 1 tablet by mouth daily in the afternoon.   metFORMIN  (GLUCOPHAGE ) 1000 MG tablet Take 1 tablet (1,000 mg total) by mouth 2 (two) times daily with a meal.   milk thistle 175 MG tablet Take 175 mg by mouth daily. 1x daily   No facility-administered encounter medications on file as of 01/09/2025.    History: Past Medical History:  Diagnosis Date   Arthritis    Blindness of right eye with low vision in contralateral eye    Diabetes mellitus without complication (HCC)    GERD (gastroesophageal reflux disease)    Hyperlipidemia    Past Surgical History:  Procedure Laterality Date   BELPHAROPTOSIS REPAIR Right 07/23/2019   CARPAL TUNNEL RELEASE Right    CATARACT PEDIATRIC     right   Family History  Problem Relation Age of Onset   Diabetes Mother    Hypertension Mother    Asthma Father    Arthritis Father    Social History   Occupational History   Occupation: Set Designer  Tobacco Use   Smoking status: Former    Current packs/day: 0.00    Average packs/day: 1.5 packs/day for 35.0 years (52.5 ttl pk-yrs)    Types: Cigarettes    Start date: 10/12/1976    Quit date: 10/13/2011    Years since quitting: 13.2   Smokeless tobacco: Never  Vaping Use   Vaping status: Never Used  Substance and Sexual Activity   Alcohol use: No    Alcohol/week: 0.0 standard drinks of alcohol   Drug use: No   Sexual activity: Not on file   Tobacco Counseling Counseling given: Not Answered  SDOH  Screenings   Food Insecurity: No Food Insecurity (01/09/2025)  Housing: Unknown (01/09/2025)  Transportation Needs: No Transportation Needs (01/09/2025)  Utilities: Not At Risk (01/09/2025)  Alcohol Screen: Low Risk (01/09/2024)  Depression (PHQ2-9): Low Risk (01/09/2025)  Recent Concern: Depression (PHQ2-9) - High Risk (11/02/2024)  Financial Resource Strain: Low Risk (01/09/2024)  Physical Activity: Insufficiently Active (01/09/2025)  Social Connections: Moderately Integrated (01/09/2025)  Stress: No Stress Concern Present (01/09/2025)  Tobacco Use: Medium Risk (01/09/2025)  Health Literacy: Adequate Health Literacy (01/09/2025)   See flowsheets for full screening details  Depression Screen PHQ 2 & 9 Depression Scale- Over the past 2 weeks, how often have you been bothered by any of the following problems? Little interest or pleasure in doing things: 0 Feeling down, depressed, or hopeless (PHQ Adolescent also includes...irritable): 0 PHQ-2 Total Score: 0 Trouble falling or staying asleep, or sleeping too much: 1 Feeling tired or having little energy: 1 Poor appetite or overeating (PHQ Adolescent also includes...weight loss): 2 Feeling bad about yourself - or that you are a failure or have let yourself or your family down: 0 Trouble concentrating on things, such as reading the newspaper or watching television (PHQ Adolescent also includes...like school work): 1 Moving or speaking so slowly that other people could have noticed. Or the opposite -  being so fidgety or restless that you have been moving around a lot more than usual: 3 Thoughts that you would be better off dead, or of hurting yourself in some way: 0 PHQ-9 Total Score: 11 If you checked off any problems, how difficult have these problems made it for you to do your work, take care of things at home, or get along with other people?: Not difficult at all  Depression Treatment Depression Interventions/Treatment : Patient refuses  Treatment     Goals Addressed             This Visit's Progress    Remain active and independent   On track            Objective:    There were no vitals filed for this visit. There is no height or weight on file to calculate BMI.  Hearing/Vision screen No results found. Immunizations and Health Maintenance Health Maintenance  Topic Date Due   Lung Cancer Screening  Never done   COVID-19 Vaccine (3 - 2025-26 season) 08/27/2024   Mammogram  02/13/2025   Zoster Vaccines- Shingrix (1 of 2) 02/02/2025 (Originally 09/28/2006)   Influenza Vaccine  03/26/2025 (Originally 07/27/2024)   DTaP/Tdap/Td (2 - Tdap) 11/02/2025 (Originally 02/13/2018)   Pneumococcal Vaccine: 50+ Years (1 of 2 - PCV) 11/02/2025 (Originally 09[birth year]29/1975)   HEMOGLOBIN A1C  05/02/2025   Diabetic kidney evaluation - eGFR measurement  11/02/2025   FOOT EXAM  11/02/2025   Diabetic kidney evaluation - Urine ACR  11/05/2025   OPHTHALMOLOGY EXAM  12/24/2025   Medicare Annual Wellness (AWV)  01/09/2026   Bone Density Scan  05/03/2026   Fecal DNA (Cologuard)  06/21/2027   Hepatitis C Screening  Completed   Meningococcal B Vaccine  Aged Out        Assessment/Plan:  This is a routine wellness examination for Katherine Ryan.  Patient Care Team: Gladis Mustard, FNP as PCP - General (Nurse Practitioner) Alvan Dorn FALCON, MD as PCP - Cardiology (Cardiology) Georjean Darice HERO, MD as Consulting Physician (Neurology) [facility] , Roni Roddie Bring, [place] as Consulting Physician (Podiatry)  I have personally reviewed and noted the following in the patients chart:   Medical and social history Use of alcohol, tobacco or illicit drugs  Current medications and supplements including opioid prescriptions. Functional ability and status Nutritional status Physical activity Advanced directives List of other physicians Hospitalizations, surgeries, and ER visits in previous 12  months Vitals Screenings to include cognitive, depression, and falls Referrals and appointments  No orders of the defined types were placed in this encounter.  In addition, I have reviewed and discussed with patient certain preventive protocols, quality metrics, and best practice recommendations. A written personalized care plan for preventive services as well as general preventive health recommendations were provided to patient   Ozie Ned, [facility]   01/09/2025   Return in 1 year (on 01/09/2026).  After Visit Summary: (MyChart) Due to this being a telephonic visit, the after visit summary with patients personalized plan was offered to patient via MyChart   Nurse Notes: Pt is aware and due the following: Covid--declined. Mammogram-ordered placed, per pt would like to discuss re lung cancer screening before placing order.  "

## 2025-01-09 NOTE — Patient Instructions (Signed)
 Ms. Mcmenamy,  Thank you for taking the time for your Medicare Wellness Visit. I appreciate your continued commitment to your health goals. Please review the care plan we discussed, and feel free to reach out if I can assist you further.  Please note that Annual Wellness Visits do not include a physical exam. Some assessments may be limited, especially if the visit was conducted virtually. If needed, we may recommend an in-person follow-up with your provider.  Ongoing Care Seeing your primary care provider every 3 to 6 months helps us  monitor your health and provide consistent, personalized care.   Referrals If a referral was made during today's visit and you haven't received any updates within two weeks, please contact the referred provider directly to check on the status.  Recommended Screenings:  Health Maintenance  Topic Date Due   Screening for Lung Cancer  Never done   COVID-19 Vaccine (3 - 2025-26 season) 08/27/2024   Medicare Annual Wellness Visit  01/08/2025   Breast Cancer Screening  02/13/2025   Zoster (Shingles) Vaccine (1 of 2) 02/02/2025*   Flu Shot  03/26/2025*   DTaP/Tdap/Td vaccine (2 - Tdap) 11/02/2025*   Pneumococcal Vaccine for age over 20 (1 of 2 - PCV) 11/02/2025*   Hemoglobin A1C  05/02/2025   Yearly kidney function blood test for diabetes  11/02/2025   Complete foot exam   11/02/2025   Yearly kidney health urinalysis for diabetes  11/05/2025   Eye exam for diabetics  12/24/2025   Osteoporosis screening with Bone Density Scan  05/03/2026   Cologuard (Stool DNA test)  06/21/2027   Hepatitis C Screening  Completed   Meningitis B Vaccine  Aged Out  *Topic was postponed. The date shown is not the original due date.       01/09/2025   10:34 AM  Advanced Directives  Does Patient Have a Medical Advance Directive? No  Would patient like information on creating a medical advance directive? No - Patient declined    Vision: Annual vision screenings are  recommended for early detection of glaucoma, cataracts, and diabetic retinopathy. These exams can also reveal signs of chronic conditions such as diabetes and high blood pressure.  Dental: Annual dental screenings help detect early signs of oral cancer, gum disease, and other conditions linked to overall health, including heart disease and diabetes.  Please see the attached documents for additional preventive care recommendations.

## 2025-02-01 NOTE — Progress Notes (Signed)
 Katherine Ryan                                          MRN: [id number]   02/01/2025   The [facility] Quality Team Specialist reviewed this patient medical record for the purposes of chart review for care gap closure. The following were reviewed: abstraction for care gap closure-kidney health evaluation for diabetes:eGFR  and uACR.    [facility] Quality Team

## 2025-02-05 ENCOUNTER — Ambulatory Visit: Admitting: Nurse Practitioner

## 2025-02-13 ENCOUNTER — Ambulatory Visit: Admitting: Cardiology

## 2026-01-10 ENCOUNTER — Ambulatory Visit
